# Patient Record
Sex: Female | Born: 1990 | Race: White | Hispanic: No | Marital: Married | Smoking: Former smoker
Health system: Southern US, Community
[De-identification: ages and names within clinical notes are randomized; demographics above are authoritative.]

## PROBLEM LIST (undated history)

## (undated) ENCOUNTER — Inpatient Hospital Stay (HOSPITAL_COMMUNITY): Payer: Self-pay

## (undated) DIAGNOSIS — E559 Vitamin D deficiency, unspecified: Secondary | ICD-10-CM

## (undated) DIAGNOSIS — E039 Hypothyroidism, unspecified: Secondary | ICD-10-CM

## (undated) DIAGNOSIS — R51 Headache: Secondary | ICD-10-CM

## (undated) DIAGNOSIS — R519 Headache, unspecified: Secondary | ICD-10-CM

## (undated) DIAGNOSIS — N943 Premenstrual tension syndrome: Secondary | ICD-10-CM

## (undated) DIAGNOSIS — F32A Depression, unspecified: Secondary | ICD-10-CM

## (undated) DIAGNOSIS — F909 Attention-deficit hyperactivity disorder, unspecified type: Secondary | ICD-10-CM

## (undated) DIAGNOSIS — E7800 Pure hypercholesterolemia, unspecified: Secondary | ICD-10-CM

## (undated) HISTORY — DX: Headache: R51

## (undated) HISTORY — DX: Pure hypercholesterolemia, unspecified: E78.00

## (undated) HISTORY — DX: Attention-deficit hyperactivity disorder, unspecified type: F90.9

## (undated) HISTORY — DX: Headache, unspecified: R51.9

## (undated) HISTORY — PX: WISDOM TOOTH EXTRACTION: SHX21

## (undated) HISTORY — PX: EXTERNAL EAR SURGERY: SHX627

## (undated) HISTORY — PX: TONSILLECTOMY: SUR1361

## (undated) HISTORY — DX: Depression, unspecified: F32.A

---
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
## 2010-04-02 ENCOUNTER — Other Ambulatory Visit: Admission: RE | Admit: 2010-04-02 | Discharge: 2010-04-02 | Payer: Self-pay | Admitting: Obstetrics and Gynecology

## 2010-08-11 ENCOUNTER — Inpatient Hospital Stay (HOSPITAL_COMMUNITY)
Admission: AD | Admit: 2010-08-11 | Discharge: 2010-08-26 | Payer: Self-pay | Source: Home / Self Care | Admitting: Obstetrics and Gynecology

## 2010-08-23 ENCOUNTER — Encounter (INDEPENDENT_AMBULATORY_CARE_PROVIDER_SITE_OTHER): Payer: Self-pay | Admitting: Obstetrics and Gynecology

## 2010-08-23 DIAGNOSIS — O42919 Preterm premature rupture of membranes, unspecified as to length of time between rupture and onset of labor, unspecified trimester: Secondary | ICD-10-CM

## 2010-08-23 DIAGNOSIS — O321XX0 Maternal care for breech presentation, not applicable or unspecified: Secondary | ICD-10-CM

## 2011-01-20 LAB — CBC
HCT: 27.9 % — ABNORMAL LOW (ref 36.0–46.0)
HCT: 31.5 % — ABNORMAL LOW (ref 36.0–46.0)
Hemoglobin: 11 g/dL — ABNORMAL LOW (ref 12.0–15.0)
Hemoglobin: 9.7 g/dL — ABNORMAL LOW (ref 12.0–15.0)
MCH: 31 pg (ref 26.0–34.0)
MCH: 31.3 pg (ref 26.0–34.0)
MCHC: 34.7 g/dL (ref 30.0–36.0)
MCHC: 35 g/dL (ref 30.0–36.0)
MCV: 89.4 fL (ref 78.0–100.0)
MCV: 89.5 fL (ref 78.0–100.0)
Platelets: 199 10*3/uL (ref 150–400)
Platelets: 228 10*3/uL (ref 150–400)
RBC: 3.12 MIL/uL — ABNORMAL LOW (ref 3.87–5.11)
RBC: 3.52 MIL/uL — ABNORMAL LOW (ref 3.87–5.11)
RDW: 14 % (ref 11.5–15.5)
RDW: 14.1 % (ref 11.5–15.5)
WBC: 14.2 10*3/uL — ABNORMAL HIGH (ref 4.0–10.5)
WBC: 14.5 10*3/uL — ABNORMAL HIGH (ref 4.0–10.5)

## 2011-01-20 LAB — TYPE AND SCREEN
ABO/RH(D): O POS
Antibody Screen: NEGATIVE

## 2011-01-21 LAB — CBC
HCT: 26.6 % — ABNORMAL LOW (ref 36.0–46.0)
HCT: 27 % — ABNORMAL LOW (ref 36.0–46.0)
HCT: 31 % — ABNORMAL LOW (ref 36.0–46.0)
Hemoglobin: 10.7 g/dL — ABNORMAL LOW (ref 12.0–15.0)
Hemoglobin: 9.1 g/dL — ABNORMAL LOW (ref 12.0–15.0)
Hemoglobin: 9.6 g/dL — ABNORMAL LOW (ref 12.0–15.0)
MCH: 30.7 pg (ref 26.0–34.0)
MCH: 30.8 pg (ref 26.0–34.0)
MCH: 32.1 pg (ref 26.0–34.0)
MCHC: 34.2 g/dL (ref 30.0–36.0)
MCHC: 34.6 g/dL (ref 30.0–36.0)
MCHC: 35.6 g/dL (ref 30.0–36.0)
MCV: 89.2 fL (ref 78.0–100.0)
MCV: 89.6 fL (ref 78.0–100.0)
MCV: 90.2 fL (ref 78.0–100.0)
Platelets: 165 10*3/uL (ref 150–400)
Platelets: 189 10*3/uL (ref 150–400)
Platelets: 214 10*3/uL (ref 150–400)
RBC: 2.97 MIL/uL — ABNORMAL LOW (ref 3.87–5.11)
RBC: 2.99 MIL/uL — ABNORMAL LOW (ref 3.87–5.11)
RBC: 3.48 MIL/uL — ABNORMAL LOW (ref 3.87–5.11)
RDW: 13.5 % (ref 11.5–15.5)
RDW: 13.7 % (ref 11.5–15.5)
RDW: 14.3 % (ref 11.5–15.5)
WBC: 12.8 10*3/uL — ABNORMAL HIGH (ref 4.0–10.5)
WBC: 13 10*3/uL — ABNORMAL HIGH (ref 4.0–10.5)
WBC: 16 10*3/uL — ABNORMAL HIGH (ref 4.0–10.5)

## 2011-01-21 LAB — GC/CHLAMYDIA PROBE AMP, URINE
Chlamydia, Swab/Urine, PCR: NEGATIVE
GC Probe Amp, Urine: NEGATIVE

## 2011-01-21 LAB — GLUCOSE, CAPILLARY: Glucose-Capillary: 144 mg/dL — ABNORMAL HIGH (ref 70–99)

## 2011-01-21 LAB — URINE MICROSCOPIC-ADD ON

## 2011-01-21 LAB — DIFFERENTIAL
Basophils Absolute: 0 10*3/uL (ref 0.0–0.1)
Basophils Absolute: 0 10*3/uL (ref 0.0–0.1)
Basophils Relative: 0 % (ref 0–1)
Basophils Relative: 0 % (ref 0–1)
Eosinophils Absolute: 0 10*3/uL (ref 0.0–0.7)
Eosinophils Absolute: 0 10*3/uL (ref 0.0–0.7)
Eosinophils Relative: 0 % (ref 0–5)
Eosinophils Relative: 0 % (ref 0–5)
Lymphocytes Relative: 10 % — ABNORMAL LOW (ref 12–46)
Lymphocytes Relative: 16 % (ref 12–46)
Lymphs Abs: 1.6 10*3/uL (ref 0.7–4.0)
Lymphs Abs: 2.1 10*3/uL (ref 0.7–4.0)
Monocytes Absolute: 0.7 10*3/uL (ref 0.1–1.0)
Monocytes Absolute: 0.7 10*3/uL (ref 0.1–1.0)
Monocytes Relative: 5 % (ref 3–12)
Monocytes Relative: 6 % (ref 3–12)
Neutro Abs: 10.1 10*3/uL — ABNORMAL HIGH (ref 1.7–7.7)
Neutro Abs: 13.7 10*3/uL — ABNORMAL HIGH (ref 1.7–7.7)
Neutrophils Relative %: 78 % — ABNORMAL HIGH (ref 43–77)
Neutrophils Relative %: 85 % — ABNORMAL HIGH (ref 43–77)

## 2011-01-21 LAB — URINALYSIS, ROUTINE W REFLEX MICROSCOPIC
Bilirubin Urine: NEGATIVE
Glucose, UA: NEGATIVE mg/dL
Hgb urine dipstick: NEGATIVE
Ketones, ur: NEGATIVE mg/dL
Nitrite: NEGATIVE
Protein, ur: NEGATIVE mg/dL
Specific Gravity, Urine: 1.01 (ref 1.005–1.030)
Urobilinogen, UA: 0.2 mg/dL (ref 0.0–1.0)
pH: 7 (ref 5.0–8.0)

## 2011-01-21 LAB — STREP B DNA PROBE: Strep Group B Ag: NEGATIVE

## 2011-01-21 LAB — WET PREP, GENITAL
Clue Cells Wet Prep HPF POC: NONE SEEN
Trich, Wet Prep: NONE SEEN
Yeast Wet Prep HPF POC: NONE SEEN

## 2011-01-21 LAB — RPR: RPR Ser Ql: NONREACTIVE

## 2011-01-21 LAB — MAGNESIUM: Magnesium: 4.9 mg/dL — ABNORMAL HIGH (ref 1.5–2.5)

## 2011-09-28 ENCOUNTER — Other Ambulatory Visit: Payer: Self-pay | Admitting: Otolaryngology

## 2011-09-28 DIAGNOSIS — H902 Conductive hearing loss, unspecified: Secondary | ICD-10-CM

## 2011-09-28 DIAGNOSIS — H9210 Otorrhea, unspecified ear: Secondary | ICD-10-CM

## 2011-09-28 DIAGNOSIS — H6980 Other specified disorders of Eustachian tube, unspecified ear: Secondary | ICD-10-CM

## 2011-09-28 DIAGNOSIS — H7190 Unspecified cholesteatoma, unspecified ear: Secondary | ICD-10-CM

## 2011-09-29 ENCOUNTER — Ambulatory Visit
Admission: RE | Admit: 2011-09-29 | Discharge: 2011-09-29 | Disposition: A | Discharge status: Home / Self Care | Payer: BC Managed Care – PPO | Source: Ambulatory Visit | Attending: Otolaryngology | Admitting: Otolaryngology

## 2011-09-29 DIAGNOSIS — H6980 Other specified disorders of Eustachian tube, unspecified ear: Secondary | ICD-10-CM

## 2011-09-29 DIAGNOSIS — H9210 Otorrhea, unspecified ear: Secondary | ICD-10-CM

## 2011-09-29 DIAGNOSIS — H902 Conductive hearing loss, unspecified: Secondary | ICD-10-CM

## 2011-09-29 DIAGNOSIS — H7190 Unspecified cholesteatoma, unspecified ear: Secondary | ICD-10-CM

## 2011-10-06 ENCOUNTER — Other Ambulatory Visit: Payer: Self-pay | Admitting: Otolaryngology

## 2011-10-07 IMAGING — US US OB COMP +14 WK
1 series · 14 of 28 positions shown · non-contrast
Comparison: none

OBSTETRICAL ULTRASOUND:
 This ultrasound exam was performed in the [HOSPITAL] Ultrasound Department.  The OB US report was generated in the AS system, and faxed to the ordering physician.  This report is also available in [HOSPITAL]?s [employer] and in [REDACTED] PACS.

[Series 1: us ob comp +14 wk · 0.27mm/px · 41 acquisitions, 14 frames shown]
[im 2/41]
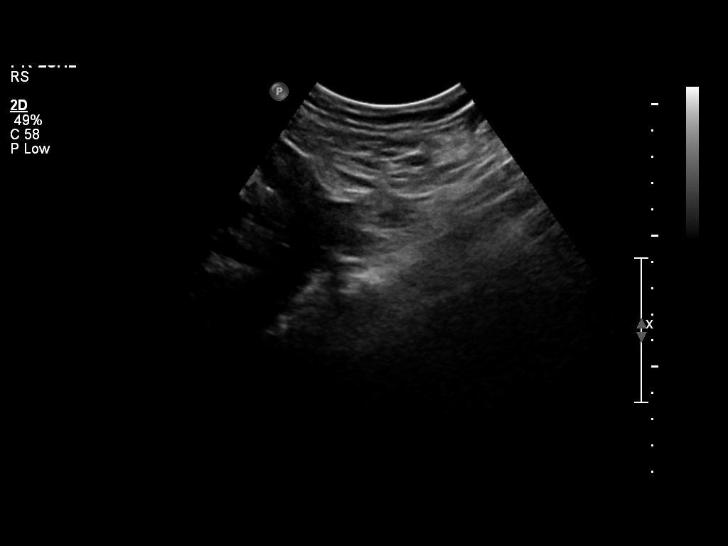
[im 5/41]
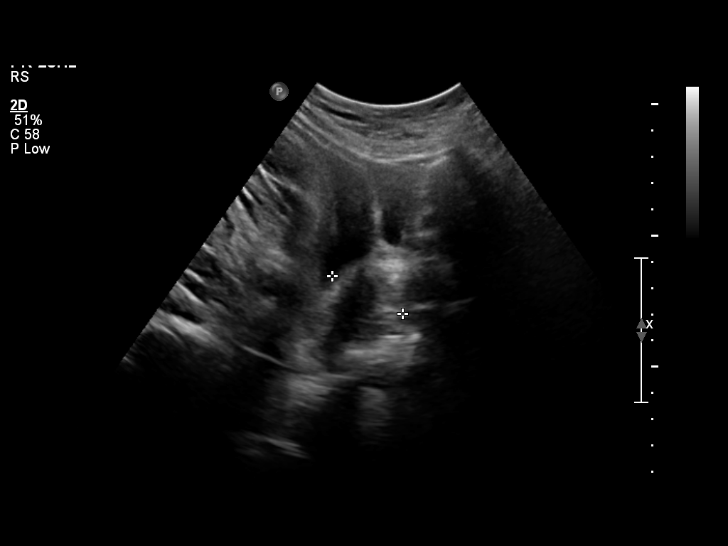
[im 8/41]
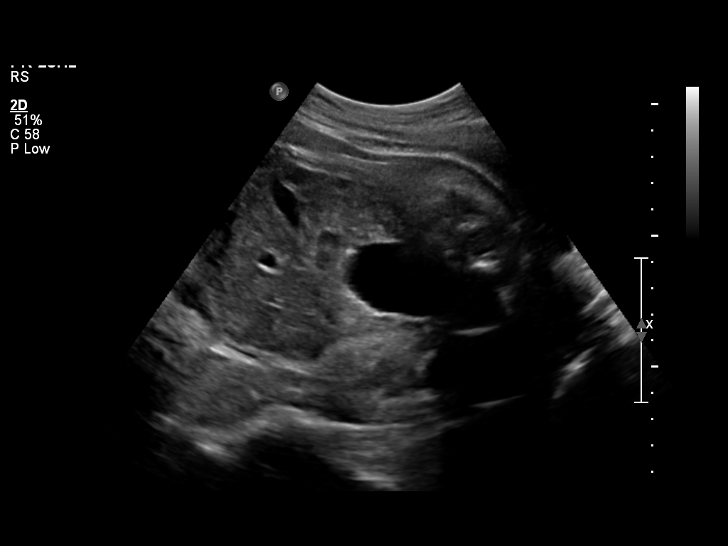
[im 11/41]
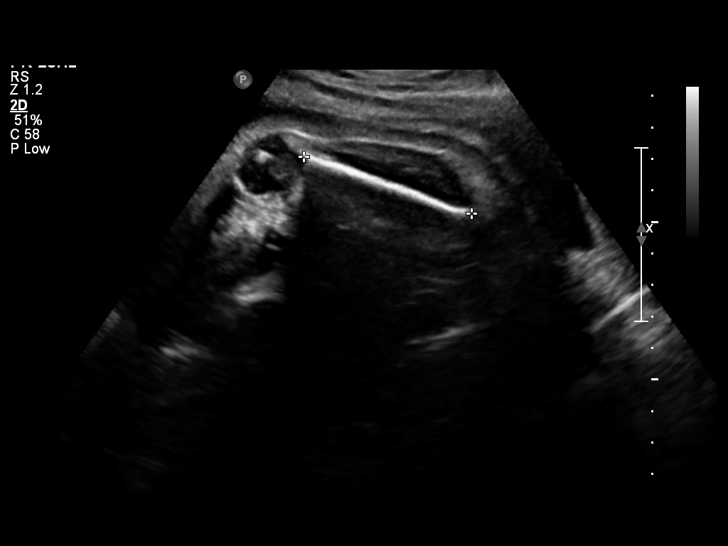
[im 14/41]
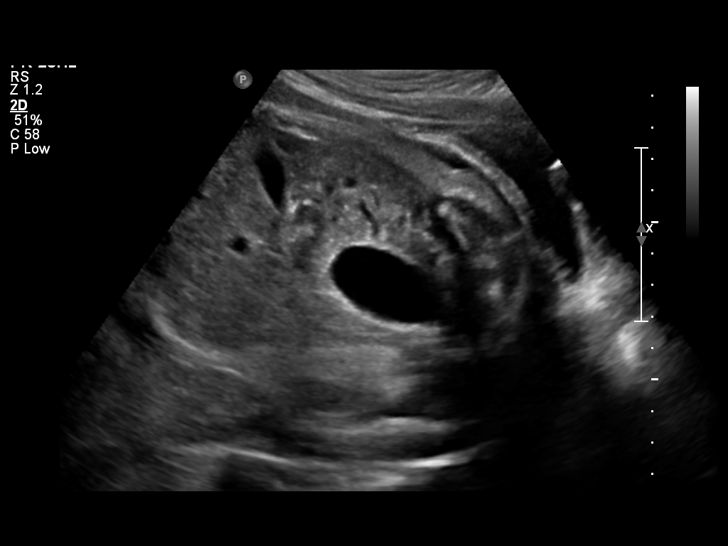
[im 17/41]
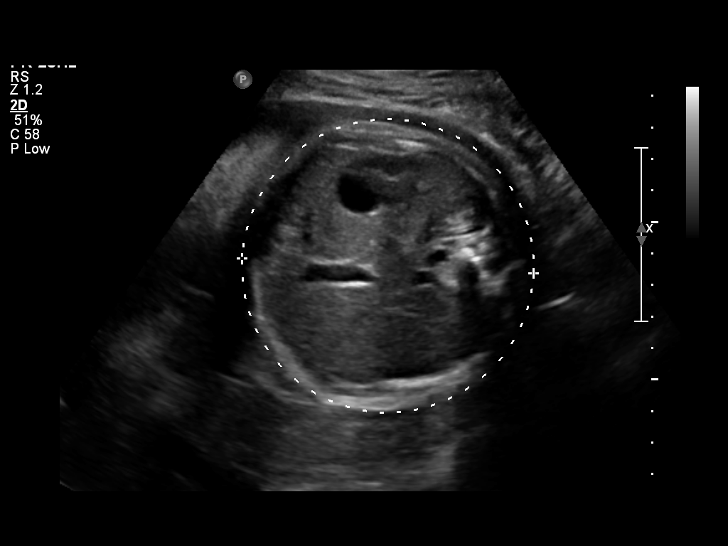
[im 20/41]
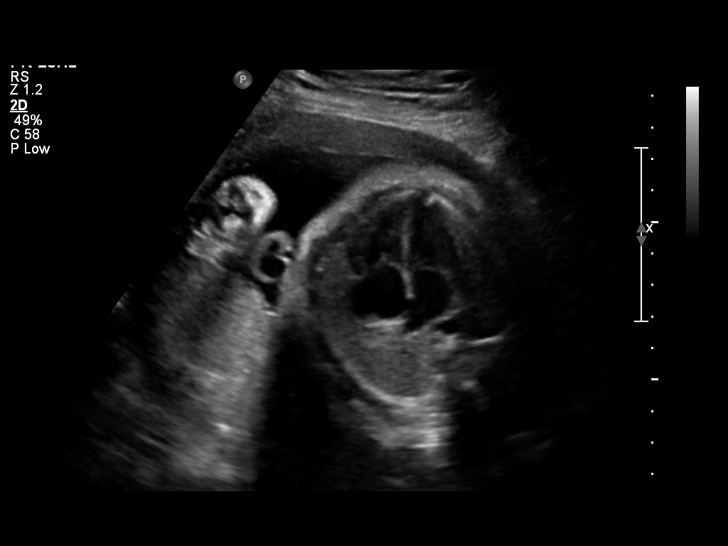
[im 23/41]
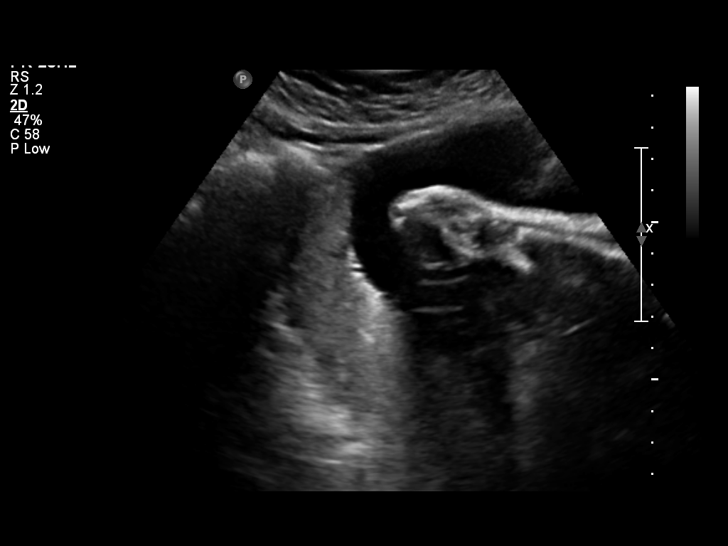
[im 26/41]
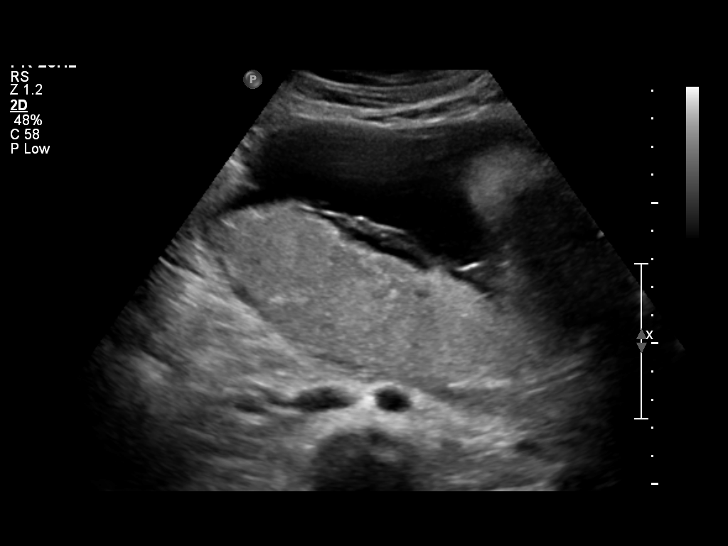
[im 29/41]
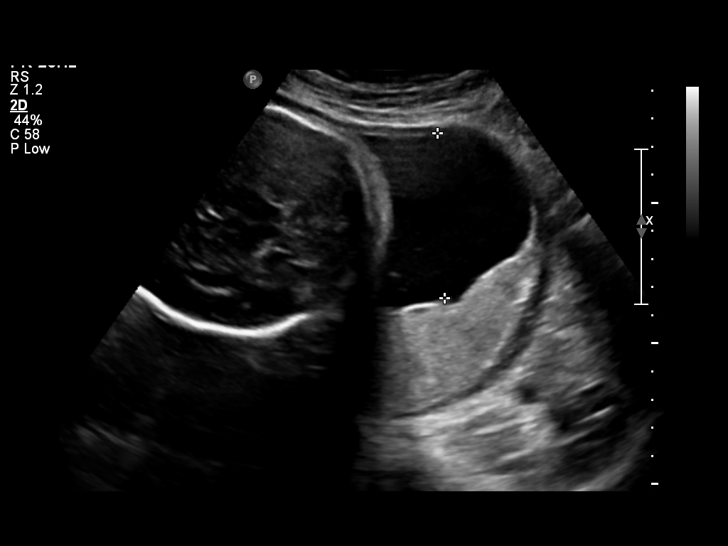
[im 32/41]
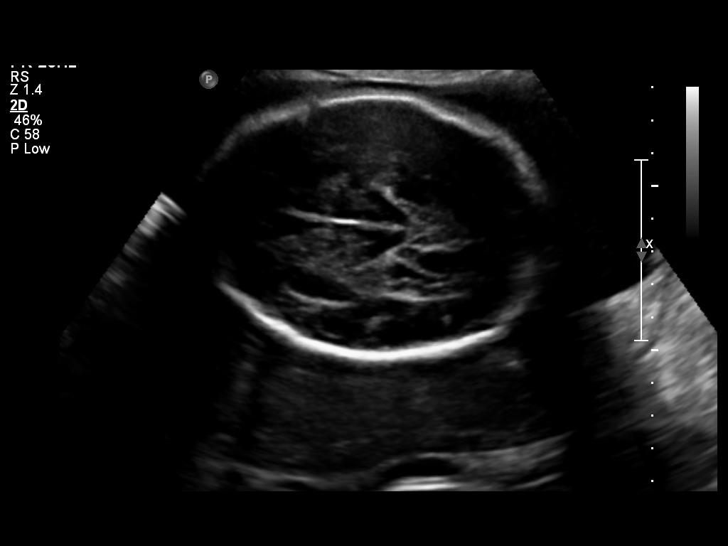
[im 35/41]
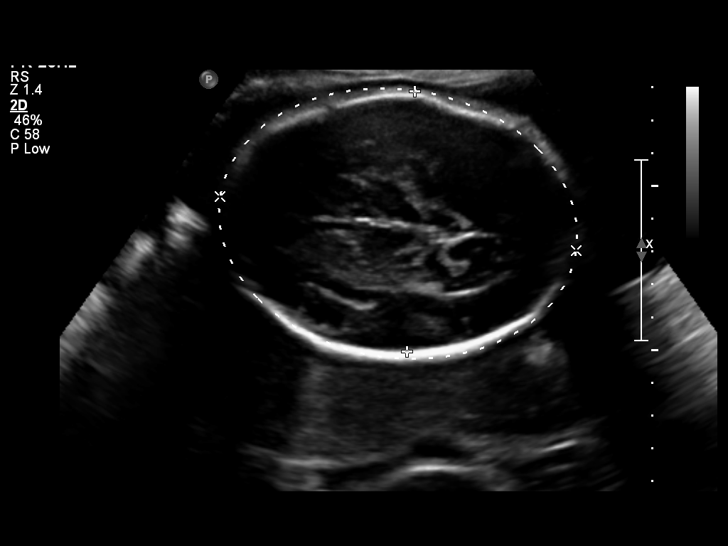
[im 38/41]
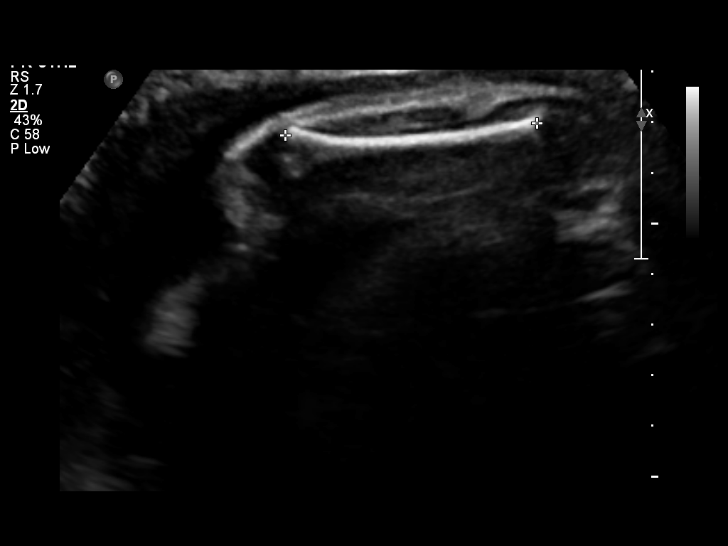
[im 41/41]
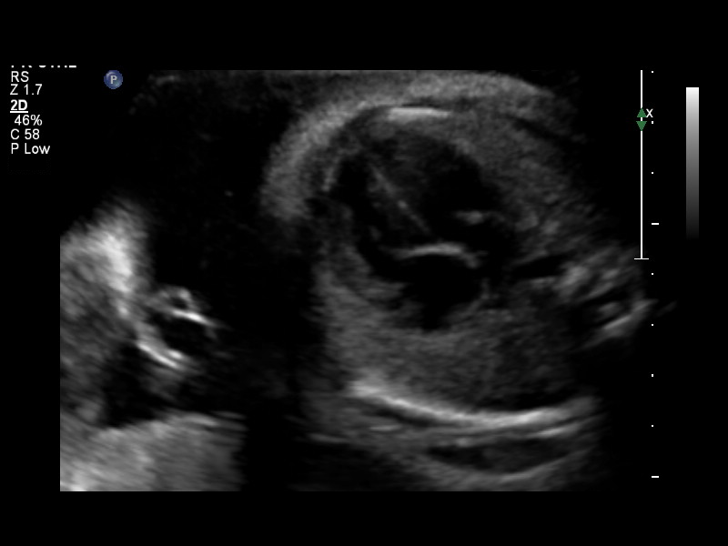

[14 of 28 positions shown; findings below may reference images not displayed]

IMPRESSION: See AS Obstetric US report.

## 2011-10-10 IMAGING — CR DG CHEST 1V PORT
1 series · 1 of 1 positions shown · non-contrast
Comparison: None.

CLINICAL DATA: 19 year old with shortness of breath and 32 weeks
pregnant.

PORTABLE CHEST - 1 VIEW

[view not recorded]
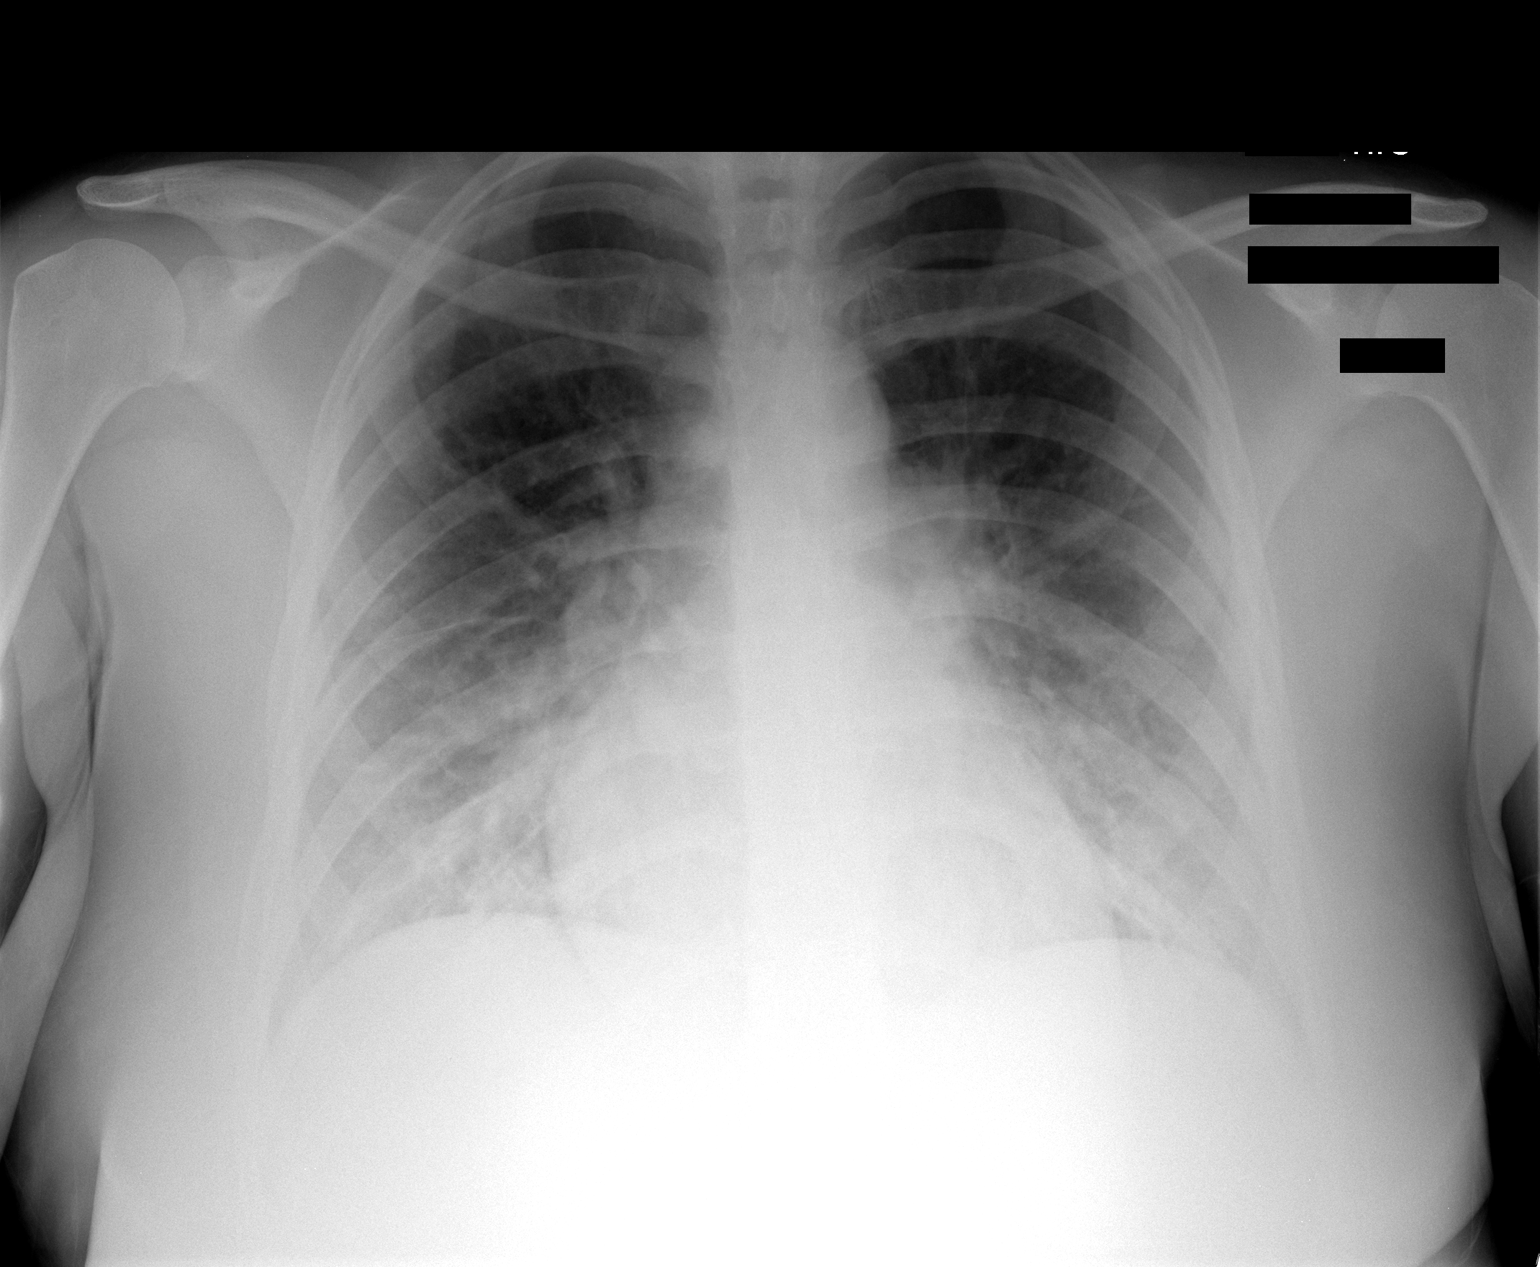

[1 of 1 positions shown; findings below may reference images not displayed]

FINDINGS: Single view of the chest demonstrates bilateral airspace
densities in the lower lungs.  Heart size is within normal limits.
Trachea is midline.  Central peribronchial thickening  raises
concern for edema.  Osseous structures appear intact.
IMPRESSION: Bilateral airspace and interstitial densities suggestive for mild
pulmonary edema.

## 2011-11-08 ENCOUNTER — Emergency Department (HOSPITAL_COMMUNITY)
Admission: EM | Admit: 2011-11-08 | Discharge: 2011-11-08 | Disposition: A | Discharge status: Home / Self Care | Payer: BC Managed Care – PPO | Attending: Emergency Medicine | Admitting: Emergency Medicine

## 2011-11-08 ENCOUNTER — Encounter: Payer: Self-pay | Admitting: Emergency Medicine

## 2011-11-08 DIAGNOSIS — R112 Nausea with vomiting, unspecified: Secondary | ICD-10-CM | POA: Insufficient documentation

## 2011-11-08 DIAGNOSIS — Z79899 Other long term (current) drug therapy: Secondary | ICD-10-CM | POA: Insufficient documentation

## 2011-11-08 DIAGNOSIS — I10 Essential (primary) hypertension: Secondary | ICD-10-CM | POA: Insufficient documentation

## 2011-11-08 DIAGNOSIS — R197 Diarrhea, unspecified: Secondary | ICD-10-CM | POA: Insufficient documentation

## 2011-11-08 DIAGNOSIS — IMO0001 Reserved for inherently not codable concepts without codable children: Secondary | ICD-10-CM | POA: Insufficient documentation

## 2011-11-08 DIAGNOSIS — R109 Unspecified abdominal pain: Secondary | ICD-10-CM | POA: Insufficient documentation

## 2011-11-08 DIAGNOSIS — F17200 Nicotine dependence, unspecified, uncomplicated: Secondary | ICD-10-CM | POA: Insufficient documentation

## 2011-11-08 DIAGNOSIS — R6883 Chills (without fever): Secondary | ICD-10-CM | POA: Insufficient documentation

## 2011-11-08 MED ORDER — ONDANSETRON 4 MG PO TBDP
8.0000 mg | ORAL_TABLET | Freq: Once | ORAL | Status: AC
  Administered 2011-11-08: 8 mg via ORAL
  Filled 2011-11-08: qty 2

## 2011-11-08 MED ORDER — ONDANSETRON 8 MG PO TBDP: 8.0000 mg | ORAL_TABLET | Freq: Three times a day (TID) | ORAL | Status: AC | PRN

## 2011-11-08 NOTE — ED Notes (Signed)
Pt continues  sipping water. No nausea or vomiting at this time. Will continue to monitor.

## 2011-11-08 NOTE — ED Provider Notes (Signed)
History     CSN: [id number]  Arrival date & time 11/08/11  0502   First MD Initiated Contact with Patient 11/08/11 0522      Chief Complaint  Patient presents with  . Emesis  . Diarrhea     Patient is a 20 y.o. female presenting with vomiting and diarrhea. The history is provided by the patient.  Emesis  This is a new problem. The current episode started 1 to 2 hours ago. The problem occurs 2 to 4 times per day. The problem has been gradually worsening. The emesis has an appearance of stomach contents. There has been no fever. Associated symptoms include abdominal pain, chills, diarrhea and myalgias. Pertinent negatives include no cough.  Diarrhea The primary symptoms include abdominal pain, vomiting, diarrhea and myalgias.  The illness is also significant for chills.  no recent travel No recent antibiotics Reports abd cramping She has had at least two episodes of diarrhea and several episodes of vomiting  Past Medical History  Diagnosis Date  . Hypertension       History reviewed. No pertinent family history.  History  Substance Use Topics  . Smoking status: Current Everyday Smoker -- 0.5 packs/day  . Smokeless tobacco: Not on file  . Alcohol Use: No    OB History    Grav Para Term Preterm Abortions TAB SAB Ect Mult Living                  Review of Systems  Constitutional: Positive for chills.  Respiratory: Negative for cough.   Gastrointestinal: Positive for vomiting, abdominal pain and diarrhea.  Musculoskeletal: Positive for myalgias.  All other systems reviewed and are negative.    Allergies  Amoxicillin  Home Medications   Current Outpatient Rx  Name Route Sig Dispense Refill  . WELLBUTRIN PO Oral Take 1 tablet by mouth daily.        BP 113/79  Pulse 100  Temp(Src) 97.8 F (36.6 C) (Oral)  Resp 20  SpO2 95%  LMP 01/05/2010  Physical Exam  CONSTITUTIONAL: Well developed/well nourished HEAD AND FACE: Normocephalic/atraumatic EYES:  EOMI/PERRL, no scleral icterus ENMT: Mucous membranes dry NECK: supple no meningeal signs SPINE:entire spine nontender CV: S1/S2 noted, no murmurs/rubs/gallops noted LUNGS: Lungs are clear to auscultation bilaterally, no apparent distress ABDOMEN: soft, nontender, no rebound or guarding GU:no cva tenderness NEURO: Pt is awake/alert, moves all extremitiesx4 EXTREMITIES: pulses normal, full ROM SKIN: warm, color normal PSYCH: no abnormalities of mood noted    ED Course  Procedures   7:29 AM Pt improved, taking PO, no distress Stable for d/c  MDM  Nursing notes reviewed and considered in documentation        Joya Gaskins, MD 11/08/11 [phone]

## 2011-11-08 NOTE — ED Notes (Signed)
Pt resting in bed. Pt finished first cup of water without any nausea or vomiting. EDP made aware. Gave pt second cup of water. Pt told to let RN know if she N/V begins.

## 2011-11-08 NOTE — ED Notes (Signed)
Pt vomited right after taking Zofran. Pt extremely concerned that she did not take full dose. Informed EDP.  No new Zofran orders at this time. Gave pt a cup of water for fluid challenge. Will continue to monitor.

## 2011-11-08 NOTE — ED Notes (Signed)
Pt stated that she woke up and was not feeling well. She became nauseated and started vomiting around 3AM. She also state that she has had diarrhea, as well. Pt states that she started to have chills and body aches. Pt stated that she has been SOB, since 3 AM. Pt currently not in ay respiratory distress. Pt stated that she has been having chest tightness.

## 2011-11-08 NOTE — ED Notes (Signed)
Report given to [facility]. No questions or concerns.

## 2012-02-24 ENCOUNTER — Other Ambulatory Visit: Payer: Self-pay | Admitting: Obstetrics and Gynecology

## 2012-02-24 ENCOUNTER — Other Ambulatory Visit (HOSPITAL_COMMUNITY)
Admission: RE | Admit: 2012-02-24 | Discharge: 2012-02-24 | Disposition: A | Discharge status: Home / Self Care | Payer: BC Managed Care – PPO | Source: Ambulatory Visit | Attending: Obstetrics and Gynecology | Admitting: Obstetrics and Gynecology

## 2012-02-24 DIAGNOSIS — Z01419 Encounter for gynecological examination (general) (routine) without abnormal findings: Secondary | ICD-10-CM | POA: Insufficient documentation

## 2012-02-24 DIAGNOSIS — Z113 Encounter for screening for infections with a predominantly sexual mode of transmission: Secondary | ICD-10-CM | POA: Insufficient documentation

## 2012-02-24 DIAGNOSIS — N760 Acute vaginitis: Secondary | ICD-10-CM | POA: Insufficient documentation

## 2013-03-20 ENCOUNTER — Other Ambulatory Visit: Payer: Self-pay | Admitting: Obstetrics and Gynecology

## 2013-03-20 ENCOUNTER — Other Ambulatory Visit (HOSPITAL_COMMUNITY)
Admission: RE | Admit: 2013-03-20 | Discharge: 2013-03-20 | Disposition: A | Discharge status: Home / Self Care | Payer: BC Managed Care – PPO | Source: Ambulatory Visit | Attending: Obstetrics and Gynecology | Admitting: Obstetrics and Gynecology

## 2013-03-20 DIAGNOSIS — Z01419 Encounter for gynecological examination (general) (routine) without abnormal findings: Secondary | ICD-10-CM | POA: Insufficient documentation

## 2013-03-20 DIAGNOSIS — Z113 Encounter for screening for infections with a predominantly sexual mode of transmission: Secondary | ICD-10-CM | POA: Insufficient documentation

## 2013-09-27 ENCOUNTER — Other Ambulatory Visit (HOSPITAL_COMMUNITY): Payer: Self-pay | Admitting: Obstetrics & Gynecology

## 2013-09-27 DIAGNOSIS — IMO0002 Reserved for concepts with insufficient information to code with codable children: Secondary | ICD-10-CM

## 2013-10-03 ENCOUNTER — Ambulatory Visit (HOSPITAL_COMMUNITY): Payer: BC Managed Care – PPO

## 2013-11-13 ENCOUNTER — Encounter (HOSPITAL_COMMUNITY): Payer: Self-pay | Admitting: Emergency Medicine

## 2013-11-13 ENCOUNTER — Emergency Department (HOSPITAL_COMMUNITY)
Admission: EM | Admit: 2013-11-13 | Discharge: 2013-11-13 | Disposition: A | Discharge status: Home / Self Care | Payer: BC Managed Care – PPO | Attending: Emergency Medicine | Admitting: Emergency Medicine

## 2013-11-13 DIAGNOSIS — R6889 Other general symptoms and signs: Secondary | ICD-10-CM

## 2013-11-13 DIAGNOSIS — H6691 Otitis media, unspecified, right ear: Secondary | ICD-10-CM

## 2013-11-13 DIAGNOSIS — F17200 Nicotine dependence, unspecified, uncomplicated: Secondary | ICD-10-CM | POA: Insufficient documentation

## 2013-11-13 DIAGNOSIS — R110 Nausea: Secondary | ICD-10-CM | POA: Insufficient documentation

## 2013-11-13 DIAGNOSIS — H5710 Ocular pain, unspecified eye: Secondary | ICD-10-CM | POA: Insufficient documentation

## 2013-11-13 DIAGNOSIS — J111 Influenza due to unidentified influenza virus with other respiratory manifestations: Secondary | ICD-10-CM | POA: Insufficient documentation

## 2013-11-13 DIAGNOSIS — IMO0001 Reserved for inherently not codable concepts without codable children: Secondary | ICD-10-CM | POA: Insufficient documentation

## 2013-11-13 DIAGNOSIS — H6690 Otitis media, unspecified, unspecified ear: Secondary | ICD-10-CM | POA: Insufficient documentation

## 2013-11-13 DIAGNOSIS — I10 Essential (primary) hypertension: Secondary | ICD-10-CM | POA: Insufficient documentation

## 2013-11-13 MED ORDER — IBUPROFEN 800 MG PO TABS
800.0000 mg | ORAL_TABLET | Freq: Once | ORAL | Status: AC
  Administered 2013-11-13: 800 mg via ORAL
  Filled 2013-11-13: qty 1

## 2013-11-13 MED ORDER — AMOXICILLIN-POT CLAVULANATE 875-125 MG PO TABS: 1.0000 | ORAL_TABLET | Freq: Two times a day (BID) | ORAL | Status: DC

## 2013-11-13 MED ORDER — GUAIFENESIN-CODEINE 100-10 MG/5ML PO SYRP: 5.0000 mL | ORAL_SOLUTION | Freq: Three times a day (TID) | ORAL | Status: DC | PRN

## 2013-11-13 NOTE — ED Notes (Signed)
Pt c/o cough, fever, r eye pain and swelling, and generalized body aches since yesterday.  Temp was 102.6 last night.  Pt took tylenol last night, none today.

## 2013-11-13 NOTE — ED Provider Notes (Signed)
CSN: [id number]     Arrival date & time 11/13/13  1032 History   First MD Initiated Contact with Patient 11/13/13 1145     Chief Complaint  Patient presents with  . Generalized Body Aches  . Eye Pain  . Cough  . Fever   (Consider location/radiation/quality/duration/timing/severity/associated sxs/prior Treatment) Patient is a 23 y.o. female presenting with eye pain, cough, and fever. The history is provided by the patient.  Eye Pain This is a new problem. The current episode started yesterday. Associated symptoms include chills, congestion, coughing, a fever, headaches, myalgias, nausea, a sore throat and swollen glands. Pertinent negatives include no anorexia, change in bowel habit, rash, urinary symptoms or vomiting. Exacerbated by: light. She has tried acetaminophen for the symptoms. The treatment provided mild relief.  Cough Associated symptoms: chills, ear pain, fever, headaches, myalgias and sore throat   Associated symptoms: no rash   Fever Associated symptoms: chills, congestion, cough, ear pain, headaches, myalgias, nausea and sore throat   Associated symptoms: no confusion, no dysuria, no rash and no vomiting   Jearldean Gutt is a 23 y.o. female who presents to the ED with cough, congestion, chills and fever that started 2 days ago. The nurse took her temperature last night at work and it was 102.6. She took tylenol and it went down. Today feels achy all over. She did not take her temperature.   Past Medical History  Diagnosis Date  . Hypertension    Past Surgical History  Procedure Laterality Date  . Cesarean section    . External ear surgery    . Tonsillectomy     No family history on file. History  Substance Use Topics  . Smoking status: Current Every Day Smoker -- 0.50 packs/day  . Smokeless tobacco: Not on file  . Alcohol Use: No   OB History   Grav Para Term Preterm Abortions TAB SAB Ect Mult Living                 Review of Systems  Constitutional: Positive  for fever and chills.  HENT: Positive for congestion, ear pain, sinus pressure and sore throat. Negative for trouble swallowing.   Eyes: Positive for pain. Negative for itching.  Respiratory: Positive for cough.   Gastrointestinal: Positive for nausea. Negative for vomiting, anorexia and change in bowel habit.  Genitourinary: Negative for dysuria, urgency and frequency.  Musculoskeletal: Positive for myalgias. Negative for neck stiffness.  Skin: Negative for rash.  Allergic/Immunologic: Negative for immunocompromised state.  Neurological: Positive for headaches. Negative for dizziness and light-headedness.  Psychiatric/Behavioral: Negative for confusion. The patient is not nervous/anxious.     Allergies  Amoxicillin  Home Medications   Current Outpatient Rx  Name  Route  Sig  Dispense  Refill  . acetaminophen (TYLENOL) 500 MG tablet   Oral   Take 1,000 mg by mouth every 6 (six) hours as needed. pain          BP 116/80  Pulse 93  Temp(Src) 98.3 F (36.8 C) (Oral)  Resp 18  Ht 5\' 2"  (1.575 m)  Wt 165 lb (74.844 kg)  BMI 30.17 kg/m2  SpO2 98%  LMP 11/13/2013 Physical Exam  Nursing note and vitals reviewed. Constitutional: She is oriented to person, place, and time. She appears well-developed and well-nourished. No distress.  HENT:  Head: Normocephalic and atraumatic.  Right Ear: There is drainage and tenderness.  Nose: Mucosal edema and rhinorrhea present. Right sinus exhibits maxillary sinus tenderness and frontal sinus tenderness.  Mouth/Throat: Uvula is midline and mucous membranes are normal. Posterior oropharyngeal erythema present.  PE tubes noted bilateral TM's. There is drainage noted from the right with erythema surrounding the tube.   Eyes: EOM are normal. Pupils are equal, round, and reactive to light. Right conjunctiva is injected.  Minimal swelling around right eye. Watering of right eye.   Neck: Normal range of motion. Neck supple.  Cardiovascular: Normal  rate and regular rhythm.   Pulmonary/Chest: Effort normal. She has no wheezes. She has no rales.  Abdominal: Soft. Bowel sounds are normal. There is no tenderness.  Musculoskeletal: Normal range of motion.  Lymphadenopathy:    She has no cervical adenopathy.  Neurological: She is alert and oriented to person, place, and time. No cranial nerve deficit.  Skin: Skin is warm and dry.  Psychiatric: She has a normal mood and affect. Her behavior is normal.    ED Course  Procedures   MDM  23 y.o. female with cough, congestion, fever and right ear pain, right side facial pain x 2 days. Will treat for flu like symptoms and treat ear infection right. Stable for discharge without signs of sepsis. She is afebrile at this time and 02 SAT is 98% on R/A.  Discussed with the patient and all questioned fully answered. She will return if any problems arise.     Janne NapoleonHope M Swanson Farnell, NP 11/13/13 1226

## 2013-11-13 NOTE — ED Notes (Signed)
H. Neese, NP at bedside 

## 2013-11-13 NOTE — ED Provider Notes (Signed)
Medical screening examination/treatment/procedure(s) were performed by non-physician practitioner and as supervising physician I was immediately available for consultation/collaboration.  EKG Interpretation   None         Adalei Novell L Shatonya Passon, MD 11/13/13 1616 

## 2013-11-13 NOTE — ED Notes (Signed)
Pt with c/o cough since 11/08/13, since last night c/o fever, body aches and right eye and facial pain, no fever noted in triage

## 2014-10-24 ENCOUNTER — Encounter (HOSPITAL_COMMUNITY): Payer: Self-pay | Admitting: Emergency Medicine

## 2014-10-24 ENCOUNTER — Emergency Department (HOSPITAL_COMMUNITY)
Admission: EM | Admit: 2014-10-24 | Discharge: 2014-10-25 | Disposition: A | Discharge status: Home / Self Care | Payer: BLUE CROSS/BLUE SHIELD | Attending: Emergency Medicine | Admitting: Emergency Medicine

## 2014-10-24 DIAGNOSIS — Z3202 Encounter for pregnancy test, result negative: Secondary | ICD-10-CM | POA: Insufficient documentation

## 2014-10-24 DIAGNOSIS — R109 Unspecified abdominal pain: Secondary | ICD-10-CM | POA: Diagnosis present

## 2014-10-24 DIAGNOSIS — Z880 Allergy status to penicillin: Secondary | ICD-10-CM | POA: Insufficient documentation

## 2014-10-24 DIAGNOSIS — Z91040 Latex allergy status: Secondary | ICD-10-CM | POA: Diagnosis not present

## 2014-10-24 DIAGNOSIS — Z79899 Other long term (current) drug therapy: Secondary | ICD-10-CM | POA: Diagnosis not present

## 2014-10-24 DIAGNOSIS — N938 Other specified abnormal uterine and vaginal bleeding: Secondary | ICD-10-CM | POA: Insufficient documentation

## 2014-10-24 DIAGNOSIS — Z720 Tobacco use: Secondary | ICD-10-CM | POA: Insufficient documentation

## 2014-10-24 DIAGNOSIS — R112 Nausea with vomiting, unspecified: Secondary | ICD-10-CM | POA: Insufficient documentation

## 2014-10-24 DIAGNOSIS — R1031 Right lower quadrant pain: Secondary | ICD-10-CM | POA: Insufficient documentation

## 2014-10-24 LAB — URINALYSIS, ROUTINE W REFLEX MICROSCOPIC
Bilirubin Urine: NEGATIVE
Glucose, UA: NEGATIVE mg/dL
Ketones, ur: NEGATIVE mg/dL
Leukocytes, UA: NEGATIVE
Nitrite: NEGATIVE
Protein, ur: NEGATIVE mg/dL
Specific Gravity, Urine: >1.03 — ABNORMAL HIGH (ref 1.005–1.030)
Urobilinogen, UA: 0.2 mg/dL (ref 0.0–1.0)
pH: 5.5 (ref 5.0–8.0)

## 2014-10-24 LAB — PREGNANCY, URINE: Preg Test, Ur: NEGATIVE

## 2014-10-24 LAB — URINE MICROSCOPIC-ADD ON

## 2014-10-24 MED ORDER — ONDANSETRON HCL 4 MG/2ML IJ SOLN: 4.0000 mg | Freq: Once | INTRAMUSCULAR | Status: DC

## 2014-10-24 MED ORDER — SODIUM CHLORIDE 0.9 % IV BOLUS (SEPSIS)
1000.0000 mL | Freq: Once | INTRAVENOUS | Status: AC
  Administered 2014-10-24: 1000 mL via INTRAVENOUS

## 2014-10-24 MED ORDER — KETOROLAC TROMETHAMINE 30 MG/ML IJ SOLN
15.0000 mg | Freq: Once | INTRAMUSCULAR | Status: AC
  Administered 2014-10-24: 15 mg via INTRAVENOUS
  Filled 2014-10-24: qty 1

## 2014-10-24 MED ORDER — ONDANSETRON HCL 4 MG/2ML IJ SOLN
4.0000 mg | Freq: Once | INTRAMUSCULAR | Status: AC
  Administered 2014-10-24: 4 mg via INTRAVENOUS
  Filled 2014-10-24: qty 2

## 2014-10-24 MED ORDER — MORPHINE SULFATE 4 MG/ML IJ SOLN
6.0000 mg | Freq: Once | INTRAMUSCULAR | Status: AC
  Administered 2014-10-24: 6 mg via INTRAVENOUS
  Filled 2014-10-24: qty 2

## 2014-10-24 NOTE — ED Notes (Signed)
Pt c/o lower abd cramping with nausea. Pt had one time of spotting of blood Sunday.

## 2014-10-24 NOTE — ED Provider Notes (Signed)
CSN: [id number]     Arrival date & time 10/24/14  2129 History  This chart was scribed for Raeford RazorStephen Kentley Blyden, MD by Bronson CurbJacqueline Melvin, ED Scribe. This patient was seen in room [facility] and the patient's care was started at 11:09 PM.   Chief Complaint  Patient presents with  . Abdominal Pain    The history is provided by the patient. No language interpreter was used.     HPI Comments: Katherine Villegas is a 23 y.o. female who presents to the Emergency Department complaining of constant, lower abdominal pain onset 5 days ago. Patient describes the pain as cramping and reports it is worse on the right side. She noted associated spotting during initial onset, however this has since resolved. In addition, there is associated nausea, vomiting, and diarrhea that began 2 days after initial onset. She has taken Tylenol and Pepto Bismol without sginificant improvement. Patient denies history of prior episodes and reports she has regular menstrual periods.   History reviewed. No pertinent past medical history. Past Surgical History  Procedure Laterality Date  . Cesarean section    . External ear surgery    . Tonsillectomy     History reviewed. No pertinent family history. History  Substance Use Topics  . Smoking status: Current Every Day Smoker -- 0.50 packs/day  . Smokeless tobacco: Not on file  . Alcohol Use: No   OB History    No data available     Review of Systems  Constitutional: Negative for fever.  Gastrointestinal: Positive for nausea, vomiting, abdominal pain and diarrhea.  Genitourinary: Positive for vaginal bleeding.  All other systems reviewed and are negative.     Allergies  Amoxicillin and Latex  Home Medications   Prior to Admission medications   Medication Sig Start Date End Date Taking? Authorizing Provider  acetaminophen (TYLENOL) 160 MG/5ML suspension Take 160 mg by mouth once as needed for mild pain or moderate pain.   Yes Historical Provider, MD  acetaminophen  (TYLENOL) 500 MG tablet Take 1,000 mg by mouth every 6 (six) hours as needed. pain   Yes Historical Provider, MD  ibuprofen (ADVIL,MOTRIN) 200 MG tablet Take 200 mg by mouth every 6 (six) hours as needed for moderate pain.   Yes Historical Provider, MD  Prenatal Vit-Fe Fumarate-FA (PRENATAL MULTIVITAMIN) TABS tablet Take 1 tablet by mouth daily at 12 noon.   Yes Historical Provider, MD  amoxicillin-clavulanate (AUGMENTIN) 875-125 MG per tablet Take 1 tablet by mouth 2 (two) times daily. Patient not taking: Reported on 10/24/2014 11/13/13   Janne NapoleonHope M Neese, NP  guaiFENesin-codeine [facility](ROBITUSSIN AC) 100-10 MG/5ML syrup Take 5 mLs by mouth 3 (three) times daily as needed for cough. Patient not taking: Reported on 10/24/2014 11/13/13   Janne NapoleonHope M Neese, NP   Triage Vitals: BP 124/77 mmHg  Pulse 97  Temp(Src) 97.9 F (36.6 C) (Oral)  Resp 16  Ht 5\' 2"  (1.575 m)  Wt 200 lb 6.4 oz (90.901 kg)  BMI 36.64 kg/m2  SpO2 100%  LMP 10/06/2014  Physical Exam  Constitutional: She is oriented to person, place, and time. She appears well-developed and well-nourished. No distress.  HENT:  Head: Normocephalic and atraumatic.  Eyes: Conjunctivae and EOM are normal.  Neck: Neck supple. No tracheal deviation present.  Cardiovascular: Normal rate, regular rhythm and normal heart sounds.   Pulmonary/Chest: Effort normal and breath sounds normal. No respiratory distress.  Abdominal: Soft. There is tenderness in the right lower quadrant. There is no rebound and no guarding.  Mild tenderness RLQ  Musculoskeletal: Normal range of motion.  Neurological: She is alert and oriented to person, place, and time.  Skin: Skin is warm and dry.  Psychiatric: She has a normal mood and affect. Her behavior is normal.  Nursing note and vitals reviewed.   ED Course  Procedures (including critical care time)  DIAGNOSTIC STUDIES: Oxygen Saturation is 100% on room air, normal by my interpretation.    COORDINATION OF CARE: At 2314  Discussed treatment plan with patient. Patient agrees.   Labs Review Labs Reviewed  URINALYSIS, ROUTINE W REFLEX MICROSCOPIC - Abnormal; Notable for the following:    Specific Gravity, Urine >1.030 (*)    Hgb urine dipstick TRACE (*)    All other components within normal limits  URINE MICROSCOPIC-ADD ON - Abnormal; Notable for the following:    Squamous Epithelial / LPF FEW (*)    Bacteria, UA FEW (*)    All other components within normal limits  BASIC METABOLIC PANEL - Abnormal; Notable for the following:    Glucose, Bld 114 (*)    All other components within normal limits  PREGNANCY, URINE  CBC WITH DIFFERENTIAL    Imaging Review No results found.   EKG Interpretation None      MDM   Final diagnoses:  Abdominal pain, unspecified abdominal location  Non-intractable vomiting with nausea, vomiting of unspecified type    23 year old female with crampy lower abdominal pain. She is very mild tenderness right lower quadrant without rebound or guarding. She is not pregnant. No dysuria. Consider appendicitis, but overall my suspicion for this is very low. Doubt ovarian torsion. Feel she is stable for discharge at this time. Transferred to medical treatment. Return precautions were discussed.  I personally preformed the services scribed in my presence. The recorded information has been reviewed is accurate. Raeford RazorStephen Lajeana Strough, MD.    Raeford RazorStephen Joory Gough, MD 11/07/14 [phone]

## 2014-10-25 LAB — BASIC METABOLIC PANEL
Anion gap: 13 (ref 5–15)
BUN: 9 mg/dL (ref 6–23)
CO2: 22 mEq/L (ref 19–32)
Calcium: 9.4 mg/dL (ref 8.4–10.5)
Chloride: 103 mEq/L (ref 96–112)
Creatinine, Ser: 0.61 mg/dL (ref 0.50–1.10)
GFR calc Af Amer: >90 mL/min (ref >90)
GFR calc non Af Amer: >90 mL/min (ref >90)
Glucose, Bld: 114 mg/dL — ABNORMAL HIGH (ref 70–99)
Potassium: 4.1 mEq/L (ref 3.7–5.3)
Sodium: 138 mEq/L (ref 137–147)

## 2014-10-25 LAB — CBC WITH DIFFERENTIAL/PLATELET
Basophils Absolute: 0 10*3/uL (ref 0.0–0.1)
Basophils Relative: 0 % (ref 0–1)
Eosinophils Absolute: 0.2 10*3/uL (ref 0.0–0.7)
Eosinophils Relative: 2 % (ref 0–5)
HCT: 38.2 % (ref 36.0–46.0)
Hemoglobin: 12.6 g/dL (ref 12.0–15.0)
Lymphocytes Relative: 36 % (ref 12–46)
Lymphs Abs: 3.8 10*3/uL (ref 0.7–4.0)
MCH: 29 pg (ref 26.0–34.0)
MCHC: 33 g/dL (ref 30.0–36.0)
MCV: 88 fL (ref 78.0–100.0)
Monocytes Absolute: 0.5 10*3/uL (ref 0.1–1.0)
Monocytes Relative: 5 % (ref 3–12)
Neutro Abs: 6 10*3/uL (ref 1.7–7.7)
Neutrophils Relative %: 57 % (ref 43–77)
Platelets: 238 10*3/uL (ref 150–400)
RBC: 4.34 MIL/uL (ref 3.87–5.11)
RDW: 12.9 % (ref 11.5–15.5)
WBC: 10.5 10*3/uL (ref 4.0–10.5)

## 2014-10-25 MED ORDER — ONDANSETRON HCL 4 MG PO TABS: 4.0000 mg | ORAL_TABLET | Freq: Four times a day (QID) | ORAL | Status: DC

## 2014-10-25 MED ORDER — TRAMADOL HCL 50 MG PO TABS: 50.0000 mg | ORAL_TABLET | Freq: Four times a day (QID) | ORAL | Status: DC | PRN

## 2014-10-25 NOTE — Discharge Instructions (Signed)
Abdominal Pain, Women °Abdominal (stomach, pelvic, or belly) pain can be caused by many things. It is important to tell your doctor: °· The location of the pain. °· Does it come and go or is it present all the time? °· Are there things that start the pain (eating certain foods, exercise)? °· Are there other symptoms associated with the pain (fever, nausea, vomiting, diarrhea)? °All of this is helpful to know when trying to find the cause of the pain. °CAUSES  °· Stomach: virus or bacteria infection, or ulcer. °· Intestine: appendicitis (inflamed appendix), regional ileitis (Crohn's disease), ulcerative colitis (inflamed colon), irritable bowel syndrome, diverticulitis (inflamed diverticulum of the colon), or cancer of the stomach or intestine. °· Gallbladder disease or stones in the gallbladder. °· Kidney disease, kidney stones, or infection. °· Pancreas infection or cancer. °· Fibromyalgia (pain disorder). °· Diseases of the female organs: °· Uterus: fibroid (non-cancerous) tumors or infection. °· Fallopian tubes: infection or tubal pregnancy. °· Ovary: cysts or tumors. °· Pelvic adhesions (scar tissue). °· Endometriosis (uterus lining tissue growing in the pelvis and on the pelvic organs). °· Pelvic congestion syndrome (female organs filling up with blood just before the menstrual period). °· Pain with the menstrual period. °· Pain with ovulation (producing an egg). °· Pain with an IUD (intrauterine device, birth control) in the uterus. °· Cancer of the female organs. °· Functional pain (pain not caused by a disease, may improve without treatment). °· Psychological pain. °· Depression. °DIAGNOSIS  °Your doctor will decide the seriousness of your pain by doing an examination. °· Blood tests. °· X-rays. °· Ultrasound. °· CT scan (computed tomography, special type of X-ray). °· MRI (magnetic resonance imaging). °· Cultures, for infection. °· Barium enema (dye inserted in the large intestine, to better view it with  X-rays). °· Colonoscopy (looking in intestine with a lighted tube). °· Laparoscopy (minor surgery, looking in abdomen with a lighted tube). °· Major abdominal exploratory surgery (looking in abdomen with a large incision). °TREATMENT  °The treatment will depend on the cause of the pain.  °· Many cases can be observed and treated at home. °· Over-the-counter medicines recommended by your caregiver. °· Prescription medicine. °· Antibiotics, for infection. °· Birth control pills, for painful periods or for ovulation pain. °· Hormone treatment, for endometriosis. °· Nerve blocking injections. °· Physical therapy. °· Antidepressants. °· Counseling with a psychologist or psychiatrist. °· Minor or major surgery. °HOME CARE INSTRUCTIONS  °· Do not take laxatives, unless directed by your caregiver. °· Take over-the-counter pain medicine only if ordered by your caregiver. Do not take aspirin because it can cause an upset stomach or bleeding. °· Try a clear liquid diet (broth or water) as ordered by your caregiver. Slowly move to a bland diet, as tolerated, if the pain is related to the stomach or intestine. °· Have a thermometer and take your temperature several times a day, and record it. °· Bed rest and sleep, if it helps the pain. °· Avoid sexual intercourse, if it causes pain. °· Avoid stressful situations. °· Keep your follow-up appointments and tests, as your caregiver orders. °· If the pain does not go away with medicine or surgery, you may try: °· Acupuncture. °· Relaxation exercises (yoga, meditation). °· Group therapy. °· Counseling. °SEEK MEDICAL CARE IF:  °· You notice certain foods cause stomach pain. °· Your home care treatment is not helping your pain. °· You need stronger pain medicine. °· You want your IUD removed. °· You feel faint or   lightheaded. °· You develop nausea and vomiting. °· You develop a rash. °· You are having side effects or an allergy to your medicine. °SEEK IMMEDIATE MEDICAL CARE IF:  °· Your  pain does not go away or gets worse. °· You have a fever. °· Your pain is felt only in portions of the abdomen. The right side could possibly be appendicitis. The left lower portion of the abdomen could be colitis or diverticulitis. °· You are passing blood in your stools (bright red or black tarry stools, with or without vomiting). °· You have blood in your urine. °· You develop chills, with or without a fever. °· You pass out. °MAKE SURE YOU:  °· Understand these instructions. °· Will watch your condition. °· Will get help right away if you are not doing well or get worse. °Document Released: 08/22/2007 Document Revised: 03/11/2014 Document Reviewed: 09/11/2009 °[employer]® Patient Information ©2015 [employer]. This information is not intended to replace advice given to you by your health care provider. Make sure you discuss any questions you have with your health care provider. ° °Nausea and Vomiting °Nausea is a sick feeling that often comes before throwing up (vomiting). Vomiting is a reflex where stomach contents come out of your mouth. Vomiting can cause severe loss of body fluids (dehydration). Children and elderly adults can become dehydrated quickly, especially if they also have diarrhea. Nausea and vomiting are symptoms of a condition or disease. It is important to find the cause of your symptoms. °CAUSES  °· Direct irritation of the stomach lining. This irritation can result from increased acid production (gastroesophageal reflux disease), infection, food poisoning, taking certain medicines (such as nonsteroidal anti-inflammatory drugs), alcohol use, or tobacco use. °· Signals from the brain. These signals could be caused by a headache, heat exposure, an inner ear disturbance, increased pressure in the brain from injury, infection, a tumor, or a concussion, pain, emotional stimulus, or metabolic problems. °· An obstruction in the gastrointestinal tract (bowel obstruction). °· Illnesses such as diabetes,  hepatitis, gallbladder problems, appendicitis, kidney problems, cancer, sepsis, atypical symptoms of a heart attack, or eating disorders. °· Medical treatments such as chemotherapy and radiation. °· Receiving medicine that makes you sleep (general anesthetic) during surgery. °DIAGNOSIS °Your caregiver may ask for tests to be done if the problems do not improve after a few days. Tests may also be done if symptoms are severe or if the reason for the nausea and vomiting is not clear. Tests may include: °· Urine tests. °· Blood tests. °· Stool tests. °· Cultures (to look for evidence of infection). °· X-rays or other imaging studies. °Test results can help your caregiver make decisions about treatment or the need for additional tests. °TREATMENT °You need to stay well hydrated. Drink frequently but in small amounts. You may wish to drink water, sports drinks, clear broth, or eat frozen ice pops or gelatin dessert to help stay hydrated. When you eat, eating slowly may help prevent nausea. There are also some antinausea medicines that may help prevent nausea. °HOME CARE INSTRUCTIONS  °· Take all medicine as directed by your caregiver. °· If you do not have an appetite, do not force yourself to eat. However, you must continue to drink fluids. °· If you have an appetite, eat a normal diet unless your caregiver tells you differently. °¨ Eat a variety of complex carbohydrates (rice, wheat, potatoes, bread), lean meats, yogurt, fruits, and vegetables. °¨ Avoid high-fat foods because they are more difficult to digest. °· Drink enough water and fluids   to keep your urine clear or pale yellow. °· If you are dehydrated, ask your caregiver for specific rehydration instructions. Signs of dehydration may include: °¨ Severe thirst. °¨ Dry lips and mouth. °¨ Dizziness. °¨ Dark urine. °¨ Decreasing urine frequency and amount. °¨ Confusion. °¨ Rapid breathing or pulse. °SEEK IMMEDIATE MEDICAL CARE IF:  °· You have blood or brown flecks  (like coffee grounds) in your vomit. °· You have black or bloody stools. °· You have a severe headache or stiff neck. °· You are confused. °· You have severe abdominal pain. °· You have chest pain or trouble breathing. °· You do not urinate at least once every 8 hours. °· You develop cold or clammy skin. °· You continue to vomit for longer than 24 to 48 hours. °· You have a fever. °MAKE SURE YOU:  °· Understand these instructions. °· Will watch your condition. °· Will get help right away if you are not doing well or get worse. °Document Released: 10/25/2005 Document Revised: 01/17/2012 Document Reviewed: 03/24/2011 °Exit[employer]® Patient Information ©2015 [employer]. This information is not intended to replace advice given to you by your health care provider. Make sure you discuss any questions you have with your health care provider. ° °

## 2015-02-28 ENCOUNTER — Ambulatory Visit (INDEPENDENT_AMBULATORY_CARE_PROVIDER_SITE_OTHER): Payer: BLUE CROSS/BLUE SHIELD | Admitting: Neurology

## 2015-02-28 ENCOUNTER — Encounter: Payer: Self-pay | Admitting: Neurology

## 2015-02-28 ENCOUNTER — Ambulatory Visit: Payer: BLUE CROSS/BLUE SHIELD | Admitting: Neurology

## 2015-02-28 VITALS — BP 132/68 | HR 88 | Resp 20 | Ht 62.0 in | Wt 202.8 lb

## 2015-02-28 DIAGNOSIS — G4441 Drug-induced headache, not elsewhere classified, intractable: Secondary | ICD-10-CM | POA: Diagnosis not present

## 2015-02-28 DIAGNOSIS — G4440 Drug-induced headache, not elsewhere classified, not intractable: Secondary | ICD-10-CM

## 2015-02-28 DIAGNOSIS — G43119 Migraine with aura, intractable, without status migrainosus: Secondary | ICD-10-CM | POA: Insufficient documentation

## 2015-02-28 DIAGNOSIS — N943 Premenstrual tension syndrome: Secondary | ICD-10-CM

## 2015-02-28 DIAGNOSIS — G43839 Menstrual migraine, intractable, without status migrainosus: Secondary | ICD-10-CM

## 2015-02-28 MED ORDER — TOPIRAMATE 100 MG PO TABS: 150.0000 mg | ORAL_TABLET | Freq: Every day | ORAL | Status: DC

## 2015-02-28 MED ORDER — SUMATRIPTAN SUCCINATE 100 MG PO TABS: ORAL_TABLET | ORAL | Status: DC

## 2015-02-28 NOTE — Patient Instructions (Signed)
1.  Increase topiramate to 150mg  at bedtime.  Give this dose 4 weeks and then call with update.  We can adjust dose if needed. 2.  Stop ibuprofen and tylenol! 3.  At earliest onset of migraine headache, take sumatriptan 100mg .  May repeat one tablet in 2 hours if needed.  Do not exceed 2 tablets in 24 hours.  Limit use and use of all pain relievers to no more than 2 days out of the week. 4.  Stop caffeine 5.  Follow sleep hygiene tips 6.  Stop smoking 7.  Increase exercise 8.  Follow up in 3 months.
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
## 2015-03-27 ENCOUNTER — Telehealth: Payer: Self-pay | Admitting: Neurology

## 2015-03-27 NOTE — Telephone Encounter (Signed)
Pt called and would like a call back in regards to medication/Dawn

## 2015-03-27 NOTE — Telephone Encounter (Signed)
I spoke with patient she thinks she needs to move up to 200 mg of Topamax she is at 150 now .

## 2015-03-27 NOTE — Telephone Encounter (Signed)
Okay.  Increase to 200mg  at bedtime.  She should contact us in 4 weeks.  I would like to know how many times a month she is now having a migraine.

## 2015-03-28 ENCOUNTER — Other Ambulatory Visit: Payer: Self-pay | Admitting: *Deleted

## 2015-03-28 MED ORDER — TOPIRAMATE 50 MG PO TABS
ORAL_TABLET | ORAL | Status: DC
Start: 2015-03-28 — End: 2015-08-22

## 2015-03-28 MED ORDER — TOPIRAMATE 100 MG PO TABS: 150.0000 mg | ORAL_TABLET | Freq: Every day | ORAL | Status: DC

## 2015-05-30 ENCOUNTER — Ambulatory Visit (INDEPENDENT_AMBULATORY_CARE_PROVIDER_SITE_OTHER): Payer: BLUE CROSS/BLUE SHIELD | Admitting: Neurology

## 2015-05-30 ENCOUNTER — Encounter: Payer: Self-pay | Admitting: Neurology

## 2015-05-30 VITALS — BP 118/70 | HR 88 | Resp 20 | Ht 62.0 in | Wt 191.6 lb

## 2015-05-30 DIAGNOSIS — G44229 Chronic tension-type headache, not intractable: Secondary | ICD-10-CM | POA: Diagnosis not present

## 2015-05-30 DIAGNOSIS — G43009 Migraine without aura, not intractable, without status migrainosus: Secondary | ICD-10-CM

## 2015-05-30 DIAGNOSIS — Z720 Tobacco use: Secondary | ICD-10-CM | POA: Insufficient documentation
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
## 2015-05-30 NOTE — Patient Instructions (Signed)
Migraine Recommendations: 1.  Continue topamax  at bedtime 2.  Take sumatriptan  at earliest onset of headache.  May repeat dose once in 2 hours if needed.  Do not exceed two tablets in 24 hours.  Keep it on you in case you have a migraine outside of the house. 3.  Limit use of pain relievers to no more than 2 days out of the week.  These medications include acetaminophen, ibuprofen, triptans and narcotics.  This will help reduce risk of rebound headaches. 4.  Be aware of common food triggers such as processed sweets, processed foods with nitrites (such as deli meat, hot dogs, sausages), foods with MSG, alcohol (such as wine), chocolate, certain cheeses, certain fruits (dried fruits, some citrus fruit), vinegar, diet soda. 4.  Avoid caffeine 5.  Routine exercise 6.  Proper sleep hygiene 7.  Stay adequately hydrated with water 8.  Keep a headache diary. 9.  Maintain proper stress management. 10.  Do not skip meals. 11.  Follow up in 3 months.

## 2015-08-22 ENCOUNTER — Other Ambulatory Visit: Payer: Self-pay | Admitting: Neurology

## 2015-08-22 NOTE — Telephone Encounter (Signed)
Rx sent 

## 2015-09-15 ENCOUNTER — Telehealth: Payer: Self-pay

## 2015-09-15 NOTE — Telephone Encounter (Signed)
She can take 100mg  for one day, then 50mg  for one day and then stop.  She may not feel well for a few days because we are tapering it off quickly, but I would like to get her off of it as soon as possible.

## 2015-09-15 NOTE — Telephone Encounter (Signed)
Attempting to reach. Main number disconnected. Emergency contact number tried, was told I had the wrong number.

## 2015-09-15 NOTE — Telephone Encounter (Signed)
She should also stop the sumatriptan.  While pregnant, she probably is only able to take Tylenol, but I would ask her OBGYN.

## 2015-09-15 NOTE — Telephone Encounter (Signed)
Pt is on Topamax. Just found out she is pregnant. Medication bottle said to informed provider if she became pregnant. Should she d/c medication? Titration down needed? Please advise.

## 2015-09-15 NOTE — Telephone Encounter (Signed)
See below

## 2015-09-18 ENCOUNTER — Telehealth: Payer: Self-pay | Admitting: Neurology

## 2015-09-18 NOTE — Telephone Encounter (Signed)
Pt has become pregnant, and not sure if she should take meds/call back @ [phone]

## 2015-09-18 NOTE — Telephone Encounter (Signed)
Please advise 

## 2015-09-19 ENCOUNTER — Other Ambulatory Visit: Payer: Self-pay | Admitting: Obstetrics & Gynecology

## 2015-09-19 ENCOUNTER — Ambulatory Visit: Payer: BLUE CROSS/BLUE SHIELD | Admitting: Neurology

## 2015-09-19 DIAGNOSIS — O3680X0 Pregnancy with inconclusive fetal viability, not applicable or unspecified: Secondary | ICD-10-CM

## 2015-09-19 NOTE — Telephone Encounter (Signed)
Message relayed to patient. Verbalized understanding and denied questions. Phone number was updated in chart.

## 2015-09-19 NOTE — Telephone Encounter (Signed)
As per my phone recommendation from a couple of days ago: She should stop topiramate (100mg  daily for one day, then 50mg  daily for one day, then stop) She should stop sumatriptan Instead, she may take magnesium oxide 400mg  daily When she gets a headache, I'm pretty sure she can only take Tylenol I believe she is scheduled to see me today.

## 2015-09-23 ENCOUNTER — Ambulatory Visit (INDEPENDENT_AMBULATORY_CARE_PROVIDER_SITE_OTHER): Payer: Medicaid Other

## 2015-09-23 ENCOUNTER — Other Ambulatory Visit: Payer: Self-pay | Admitting: Adult Health

## 2015-09-23 DIAGNOSIS — O3680X0 Pregnancy with inconclusive fetal viability, not applicable or unspecified: Secondary | ICD-10-CM

## 2015-09-23 NOTE — Progress Notes (Signed)
US 5+6 wks GS w/YS,no fetal pole seen,normal ov's bilat,pt will come back in 1 wk for f/u [employer],per Jennifer.

## 2015-10-01 ENCOUNTER — Ambulatory Visit (INDEPENDENT_AMBULATORY_CARE_PROVIDER_SITE_OTHER): Payer: Medicaid Other

## 2015-10-01 DIAGNOSIS — O3680X0 Pregnancy with inconclusive fetal viability, not applicable or unspecified: Secondary | ICD-10-CM

## 2015-10-01 NOTE — Progress Notes (Signed)
US 6+3wks single IUP w/ys, pos fht 138 bpm,normal ov's bilat,crl 5.274mm

## 2015-10-13 ENCOUNTER — Ambulatory Visit (INDEPENDENT_AMBULATORY_CARE_PROVIDER_SITE_OTHER): Payer: Medicaid Other | Admitting: Women's Health

## 2015-10-13 ENCOUNTER — Encounter: Payer: Self-pay | Admitting: Women's Health

## 2015-10-13 VITALS — BP 98/62 | HR 60 | Wt 181.0 lb

## 2015-10-13 DIAGNOSIS — Z369 Encounter for antenatal screening, unspecified: Secondary | ICD-10-CM

## 2015-10-13 DIAGNOSIS — Z3A08 8 weeks gestation of pregnancy: Secondary | ICD-10-CM | POA: Diagnosis not present

## 2015-10-13 DIAGNOSIS — Z3491 Encounter for supervision of normal pregnancy, unspecified, first trimester: Secondary | ICD-10-CM

## 2015-10-13 DIAGNOSIS — O09211 Supervision of pregnancy with history of pre-term labor, first trimester: Secondary | ICD-10-CM | POA: Diagnosis not present

## 2015-10-13 DIAGNOSIS — Z98891 History of uterine scar from previous surgery: Secondary | ICD-10-CM | POA: Insufficient documentation

## 2015-10-13 DIAGNOSIS — Z0283 Encounter for blood-alcohol and blood-drug test: Secondary | ICD-10-CM

## 2015-10-13 DIAGNOSIS — Z331 Pregnant state, incidental: Secondary | ICD-10-CM

## 2015-10-13 DIAGNOSIS — O34219 Maternal care for unspecified type scar from previous cesarean delivery: Secondary | ICD-10-CM

## 2015-10-13 DIAGNOSIS — Z3490 Encounter for supervision of normal pregnancy, unspecified, unspecified trimester: Secondary | ICD-10-CM | POA: Insufficient documentation

## 2015-10-13 DIAGNOSIS — O09891 Supervision of other high risk pregnancies, first trimester: Secondary | ICD-10-CM

## 2015-10-13 DIAGNOSIS — Z1389 Encounter for screening for other disorder: Secondary | ICD-10-CM

## 2015-10-13 DIAGNOSIS — Z8751 Personal history of pre-term labor: Secondary | ICD-10-CM | POA: Insufficient documentation

## 2015-10-13 LAB — POCT URINALYSIS DIPSTICK
GLUCOSE UA: NEGATIVE
KETONES UA: NEGATIVE
Leukocytes, UA: NEGATIVE
Nitrite, UA: NEGATIVE
Protein, UA: NEGATIVE
RBC UA: NEGATIVE

## 2015-10-13 NOTE — Patient Instructions (Signed)

## 2015-10-13 NOTE — Progress Notes (Signed)
  Subjective:  Katherine Villegas is a 24 y.o. [phone] Caucasian female at 2384w1d by LMP c/w 6wks u/s, being seen today for her first obstetrical visit.  Her obstetrical history is significant for 32wk PPROM w/ 34c/s d/t breech, SAB x 2.  Pregnancy history fully reviewed. Wants BTL  Patient reports some constipation. Denies vb, cramping, uti s/s, abnormal/malodorous vag d/c, or vulvovaginal itching/irritation.  BP 98/62 mmHg  Pulse 60  Wt 181 lb (82.101 kg)  LMP 08/17/2015 (Exact Date)  HISTORY: OB History  Gravida Para Term Preterm AB SAB TAB Ectopic Multiple Living  4 1  1 2 2    1     # Outcome Date GA Lbr Len/2nd Weight Sex Delivery Anes PTL Lv  4 Current           3 Preterm 08/23/10 10430w0d  4 lb 9 oz (2.07 kg) F CS-Unspec  Y Y  2 SAB           1 SAB              Past Medical History  Diagnosis Date  . Headache    Past Surgical History  Procedure Laterality Date  . Cesarean section    . External ear surgery    . Tonsillectomy     Family History  Problem Relation Age of Onset  . Migraines Mother   . Hypertension Mother   . Hyperlipidemia Mother   . Alcohol abuse Mother   . Diabetes Maternal Grandfather   . Alcohol abuse Father   . Arthritis Maternal Grandmother   . COPD Paternal Grandmother     Exam   System:     General: Well developed & nourished, no acute distress   Skin: Warm & dry, normal coloration and turgor, no rashes   Neurologic: Alert & oriented, normal mood   Cardiovascular: Regular rate & rhythm   Respiratory: Effort & rate normal, LCTAB, acyanotic   Abdomen: Soft, non tender   Extremities: normal strength, tone  Thin prep pap smear neg 2014   Assessment:   Pregnancy: [id number] Patient Active Problem List   Diagnosis Date Noted  . Previous cesarean section complicating pregnancy 10/13/2015  . H/O preterm delivery, currently pregnant 10/13/2015  . Tobacco abuse 05/30/2015  . Chronic tension-type headache, not intractable 05/30/2015  . Intractable  migraine with aura without status migrainosus 02/28/2015  . Intractable menstrual migraine without status migrainosus 02/28/2015    2484w1d [id number] New OB visit Constipation H/O 32wk PPROM w/ 34wk del H/O C/s d/t breech   Plan:  Initial labs drawn Continue prenatal vitamins Problem list reviewed and updated Reviewed n/v relief measures and warning s/s to report Reviewed recommended weight gain based on pre-gravid BMI Encouraged well-balanced diet Genetic Screening discussed Integrated Screen: requested Cystic fibrosis screening discussed declined Ultrasound discussed; fetal survey: requested Follow up in 4 weeks for 1st IT/NT and visit [facility] completed Declined flu shot [employer] Makena, accepted, ordered today, begin @ 16wks Offered VBAC, consent given to take home and review  Marge DuncansBooker, Leighanne Adolph Randall CNM, [facility]NP-BC 10/13/2015 10:28 AM

## 2015-10-14 LAB — URINALYSIS, ROUTINE W REFLEX MICROSCOPIC
Bilirubin, UA: NEGATIVE
Glucose, UA: NEGATIVE
Ketones, UA: NEGATIVE
Leukocytes, UA: NEGATIVE
NITRITE UA: NEGATIVE
PH UA: 8 — AB (ref 5.0–7.5)
Protein, UA: NEGATIVE
RBC, UA: NEGATIVE
Specific Gravity, UA: 1.025 (ref 1.005–1.030)
UUROB: 0.2 mg/dL (ref 0.2–1.0)

## 2015-10-14 LAB — CBC
HEMOGLOBIN: 13.2 g/dL (ref 11.1–15.9)
Hematocrit: 38.6 % (ref 34.0–46.6)
MCH: 30 pg (ref 26.6–33.0)
MCHC: 34.2 g/dL (ref 31.5–35.7)
MCV: 88 fL (ref 79–97)
PLATELETS: 255 10*3/uL (ref 150–379)
RBC: 4.4 x10E6/uL (ref 3.77–5.28)
RDW: 14 % (ref 12.3–15.4)
WBC: 10.1 10*3/uL (ref 3.4–10.8)

## 2015-10-14 LAB — HIV ANTIBODY (ROUTINE TESTING W REFLEX): HIV SCREEN 4TH GENERATION: NONREACTIVE

## 2015-10-14 LAB — PMP SCREEN PROFILE (10S), URINE
Amphetamine Screen, Ur: NEGATIVE ng/mL
BARBITURATE SCRN UR: NEGATIVE ng/mL
BENZODIAZEPINE SCREEN, URINE: NEGATIVE ng/mL
COCAINE(METAB.) SCREEN, URINE: NEGATIVE ng/mL
Cannabinoids Ur Ql Scn: NEGATIVE ng/mL
Creatinine(Crt), U: 157.2 mg/dL (ref 20.0–300.0)
METHADONE SCREEN, URINE: NEGATIVE ng/mL
OPIATE SCRN UR: NEGATIVE ng/mL
Oxycodone+Oxymorphone Ur Ql Scn: NEGATIVE ng/mL
PCP Scrn, Ur: NEGATIVE ng/mL
Ph of Urine: 7.6 (ref 4.5–8.9)
Propoxyphene, Screen: NEGATIVE ng/mL

## 2015-10-14 LAB — ANTIBODY SCREEN: ANTIBODY SCREEN: NEGATIVE

## 2015-10-14 LAB — ABO/RH: RH TYPE: POSITIVE

## 2015-10-14 LAB — RPR: RPR Ser Ql: NONREACTIVE

## 2015-10-14 LAB — GC/CHLAMYDIA PROBE AMP
CHLAMYDIA, DNA PROBE: NEGATIVE
NEISSERIA GONORRHOEAE BY PCR: NEGATIVE

## 2015-10-14 LAB — VARICELLA ZOSTER ANTIBODY, IGG: VARICELLA: 1182 {index} (ref >165)

## 2015-10-14 LAB — HEPATITIS B SURFACE ANTIGEN: HEP B S AG: NEGATIVE

## 2015-10-14 LAB — RUBELLA SCREEN: RUBELLA: 3.15 {index} (ref >0.99)

## 2015-10-14 LAB — URINE CULTURE: ORGANISM ID, BACTERIA: NO GROWTH

## 2015-11-07 ENCOUNTER — Other Ambulatory Visit: Payer: Self-pay | Admitting: Women's Health

## 2015-11-07 DIAGNOSIS — Z3682 Encounter for antenatal screening for nuchal translucency: Secondary | ICD-10-CM

## 2015-11-09 NOTE — L&D Delivery Note (Signed)
Delivery Note At 12:07 PM a viable female was delivered via VBAC, Spontaneous (Presentation: Right Occiput Anterior).  APGAR: 8, 9; weight: pending .   Placenta status: spont , intact .  Cord: 3 vessel; cord around body, reduced after delivery  Anesthesia: Epidural  Episiotomy: None Lacerations: 2st degree; Perineal Suture Repair: 3.0 vicryl Est. Blood Loss (mL):  150  Mom to postpartum.  Baby to Couplet care / Skin to Skin.  Cam HaiSHAW, Sapphire Tygart CNM 05/19/2016, 12:34 PM

## 2015-11-12 ENCOUNTER — Encounter: Payer: Self-pay | Admitting: Women's Health

## 2015-11-12 ENCOUNTER — Ambulatory Visit (INDEPENDENT_AMBULATORY_CARE_PROVIDER_SITE_OTHER): Payer: Medicaid Other

## 2015-11-12 ENCOUNTER — Ambulatory Visit (INDEPENDENT_AMBULATORY_CARE_PROVIDER_SITE_OTHER): Payer: Medicaid Other | Admitting: Women's Health

## 2015-11-12 VITALS — BP 98/56 | HR 68 | Wt 184.0 lb

## 2015-11-12 DIAGNOSIS — Z36 Encounter for antenatal screening of mother: Secondary | ICD-10-CM

## 2015-11-12 DIAGNOSIS — Z1389 Encounter for screening for other disorder: Secondary | ICD-10-CM

## 2015-11-12 DIAGNOSIS — Z3491 Encounter for supervision of normal pregnancy, unspecified, first trimester: Secondary | ICD-10-CM

## 2015-11-12 DIAGNOSIS — Z3682 Encounter for antenatal screening for nuchal translucency: Secondary | ICD-10-CM

## 2015-11-12 DIAGNOSIS — Z331 Pregnant state, incidental: Secondary | ICD-10-CM

## 2015-11-12 LAB — POCT URINALYSIS DIPSTICK
Blood, UA: NEGATIVE
GLUCOSE UA: NEGATIVE
KETONES UA: NEGATIVE
LEUKOCYTES UA: NEGATIVE
Nitrite, UA: NEGATIVE
Protein, UA: NEGATIVE

## 2015-11-12 NOTE — Progress Notes (Signed)
US 12+3wks,measurements c/w dates,fhr 156 bpm,NB present,NT 2.143mm,normal ov's bilat,post pl gr 0,crl 65.538mm

## 2015-11-12 NOTE — Progress Notes (Signed)
Low-risk OB appointment [id number] 9158w3d Estimated Date of Delivery: 05/23/16 BP 98/56 mmHg  Pulse 68  Wt 184 lb (83.462 kg)  LMP 08/17/2015 (Exact Date)  BP, weight, and urine reviewed.  Refer to obstetrical flow sheet for FH & FHR.  No fm yet. Denies cramping, lof, vb, or uti s/s. Constipation, migraines coming back- was taking topamax prior to pregnancy- advised can not take during pregnancy. Gave printed list of ha prevention/relief measures- if not helping call and will rx fioricet. Gave printed constipation relief measures.  Reviewed today's normal nt u/s, warning s/s to report. Plan:  Continue routine obstetrical care  F/U in 4wks for OB appointment, 2nd IT, and to begin weekly 17P (hers is not here today, will call and check on it) 1st IT/NT today

## 2015-11-12 NOTE — Patient Instructions (Signed)
Constipation  Drink plenty of fluid, preferably water, throughout the day  Eat foods high in fiber such as fruits, vegetables, and grains  Exercise, such as walking, is a good way to keep your bowels regular  Drink warm fluids, especially warm prune juice, or decaf coffee  Eat a 1/2 cup of real oatmeal (not instant), 1/2 cup applesauce, and 1/2-1 cup warm prune juice every day  If needed, you may take Colace (docusate sodium) stool softener once or twice a day to help keep the stool soft. If you are pregnant, wait until you are out of your first trimester (12-14 weeks of pregnancy)  If you still are having problems with constipation, you may take Miralax once daily as needed to help keep your bowels regular.  If you are pregnant, wait until you are out of your first trimester (12-14 weeks of pregnancy)   For Headaches:   Stay well hydrated, drink enough water so that your urine is clear, sometimes if you are dehydrated you can get headaches  Eat small frequent meals and snacks, sometimes if you are hungry you can get headaches  Sometimes you get headaches during pregnancy from the pregnancy hormones  You can try tylenol (1-2 regular strength 325mg  or 1-2 extra strength 500mg ) as directed on the box. The least amount of medication that works is best.   Cool compresses (Immunologistcool wet washcloth or ice pack) to area of head that is hurting  You can also try drinking a caffeinated drink to see if this will help Call us if these things aren't helping your headaches   For Prevention of Headaches/Migraines:  CoQ10 100mg  three times daily  Vitamin B2 400mg  daily  Magnesium Oxide 400-600mg  daily  If You Get a Bad Headache/Migraine:  Benadryl 25mg    Magnesium Oxide  1 large Gatorade  2 extra strength Tylenol (1,000mg  total)  1 cup coffee or Coke  If this doesn't help please call us @ [phone]   Second Trimester of Pregnancy The second trimester is from week 13 through week  28, months 4 through 6. The second trimester is often a time when you feel your best. Your body has also adjusted to being pregnant, and you begin to feel better physically. Usually, morning sickness has lessened or quit completely, you may have more energy, and you may have an increase in appetite. The second trimester is also a time when the fetus is growing rapidly. At the end of the sixth month, the fetus is about 9 inches long and weighs about 1 pounds. You will likely begin to feel the baby move (quickening) between 18 and 20 weeks of the pregnancy. BODY CHANGES Your body goes through many changes during pregnancy. The changes vary from woman to woman.   Your weight will continue to increase. You will notice your lower abdomen bulging out.  You may begin to get stretch marks on your hips, abdomen, and breasts.  You may develop headaches that can be relieved by medicines approved by your health care provider.  You may urinate more often because the fetus is pressing on your bladder.  You may develop or continue to have heartburn as a result of your pregnancy.  You may develop constipation because certain hormones are causing the muscles that push waste through your intestines to slow down.  You may develop hemorrhoids or swollen, bulging veins (varicose veins).  You may have back pain because of the weight gain and pregnancy hormones relaxing your joints between the bones in  your pelvis and as a result of a shift in weight and the muscles that support your balance.  Your breasts will continue to grow and be tender.  Your gums may bleed and may be sensitive to brushing and flossing.  Dark spots or blotches (chloasma, mask of pregnancy) may develop on your face. This will likely fade after the baby is born.  A dark line from your belly button to the pubic area (linea nigra) may appear. This will likely fade after the baby is born.  You may have changes in your hair. These can include  thickening of your hair, rapid growth, and changes in texture. Some women also have hair loss during or after pregnancy, or hair that feels dry or thin. Your hair will most likely return to normal after your baby is born. WHAT TO EXPECT AT YOUR PRENATAL VISITS During a routine prenatal visit:  You will be weighed to make sure you and the fetus are growing normally.  Your blood pressure will be taken.  Your abdomen will be measured to track your baby's growth.  The fetal heartbeat will be listened to.  Any test results from the previous visit will be discussed. Your health care provider may ask you:  How you are feeling.  If you are feeling the baby move.  If you have had any abnormal symptoms, such as leaking fluid, bleeding, severe headaches, or abdominal cramping.  If you are using any tobacco products, including cigarettes, chewing tobacco, and electronic cigarettes.  If you have any questions. Other tests that may be performed during your second trimester include:  Blood tests that check for:  Low iron levels (anemia).  Gestational diabetes (between 24 and 28 weeks).  Rh antibodies.  Urine tests to check for infections, diabetes, or protein in the urine.  An ultrasound to confirm the proper growth and development of the baby.  An amniocentesis to check for possible genetic problems.  Fetal screens for spina bifida and Down syndrome.  HIV (human immunodeficiency virus) testing. Routine prenatal testing includes screening for HIV, unless you choose not to have this test. HOME CARE INSTRUCTIONS   Avoid all smoking, herbs, alcohol, and unprescribed drugs. These chemicals affect the formation and growth of the baby.  Do not use any tobacco products, including cigarettes, chewing tobacco, and electronic cigarettes. If you need help quitting, ask your health care provider. You may receive counseling support and other resources to help you quit.  Follow your health care  provider's instructions regarding medicine use. There are medicines that are either safe or unsafe to take during pregnancy.  Exercise only as directed by your health care provider. Experiencing uterine cramps is a good sign to stop exercising.  Continue to eat regular, healthy meals.  Wear a good support bra for breast tenderness.  Do not use hot tubs, steam rooms, or saunas.  Wear your seat belt at all times when driving.  Avoid raw meat, uncooked cheese, cat litter boxes, and soil used by cats. These carry germs that can cause birth defects in the baby.  Take your prenatal vitamins.  Take 1500-2000 mg of calcium daily starting at the 20th week of pregnancy until you deliver your baby.  Try taking a stool softener (if your health care provider approves) if you develop constipation. Eat more high-fiber foods, such as fresh vegetables or fruit and whole grains. Drink plenty of fluids to keep your urine clear or pale yellow.  Take warm sitz baths to soothe any pain  or discomfort caused by hemorrhoids. Use hemorrhoid cream if your health care provider approves.  If you develop varicose veins, wear support hose. Elevate your feet for 15 minutes, 3-4 times a day. Limit salt in your diet.  Avoid heavy lifting, wear low heel shoes, and practice good posture.  Rest with your legs elevated if you have leg cramps or low back pain.  Visit your dentist if you have not gone yet during your pregnancy. Use a soft toothbrush to brush your teeth and be gentle when you floss.  A sexual relationship may be continued unless your health care provider directs you otherwise.  Continue to go to all your prenatal visits as directed by your health care provider. SEEK MEDICAL CARE IF:   You have dizziness.  You have mild pelvic cramps, pelvic pressure, or nagging pain in the abdominal area.  You have persistent nausea, vomiting, or diarrhea.  You have a bad smelling vaginal discharge.  You have pain  with urination. SEEK IMMEDIATE MEDICAL CARE IF:   You have a fever.  You are leaking fluid from your vagina.  You have spotting or bleeding from your vagina.  You have severe abdominal cramping or pain.  You have rapid weight gain or loss.  You have shortness of breath with chest pain.  You notice sudden or extreme swelling of your face, hands, ankles, feet, or legs.  You have not felt your baby move in over an hour.  You have severe headaches that do not go away with medicine.  You have vision changes.   This information is not intended to replace advice given to you by your health care provider. Make sure you discuss any questions you have with your health care provider.   Document Released: 10/19/2001 Document Revised: 11/15/2014 Document Reviewed: 12/26/2012 Elsevier Interactive Patient Education [employer]

## 2015-11-14 ENCOUNTER — Telehealth: Payer: Self-pay | Admitting: *Deleted

## 2015-11-14 LAB — MATERNAL SCREEN, INTEGRATED #1
CROWN RUMP LENGTH MAT SCREEN: 65.8 mm
GEST. AGE ON COLLECTION DATE: 12.7 wk
Maternal Age at EDD: 25.3 years
Nuchal Translucency (NT): 2.3 mm
Number of Fetuses: 1
PAPP-A VALUE: 266.7 ng/mL
Weight: 184 [lb_av]

## 2015-11-14 NOTE — Telephone Encounter (Signed)
Makena called stating that the pt's Medicaid would cover the medication and that [facility] the pt is around 15 wks and then the medication will be sent to our office.   If any questions call [phone] option 1

## 2015-12-05 ENCOUNTER — Telehealth: Payer: Self-pay | Admitting: *Deleted

## 2015-12-05 NOTE — Telephone Encounter (Signed)
Returned call to [facility] for PA - Makena. Form to be faxed and completed for PA [employer].

## 2015-12-10 ENCOUNTER — Ambulatory Visit (INDEPENDENT_AMBULATORY_CARE_PROVIDER_SITE_OTHER): Payer: BLUE CROSS/BLUE SHIELD | Admitting: Advanced Practice Midwife

## 2015-12-10 ENCOUNTER — Encounter: Payer: Self-pay | Admitting: Advanced Practice Midwife

## 2015-12-10 VITALS — BP 90/60 | HR 66 | Wt 183.0 lb

## 2015-12-10 DIAGNOSIS — Z1389 Encounter for screening for other disorder: Secondary | ICD-10-CM

## 2015-12-10 DIAGNOSIS — Z3492 Encounter for supervision of normal pregnancy, unspecified, second trimester: Secondary | ICD-10-CM

## 2015-12-10 DIAGNOSIS — Z363 Encounter for antenatal screening for malformations: Secondary | ICD-10-CM

## 2015-12-10 DIAGNOSIS — O09892 Supervision of other high risk pregnancies, second trimester: Secondary | ICD-10-CM

## 2015-12-10 DIAGNOSIS — O09212 Supervision of pregnancy with history of pre-term labor, second trimester: Secondary | ICD-10-CM

## 2015-12-10 DIAGNOSIS — Z3682 Encounter for antenatal screening for nuchal translucency: Secondary | ICD-10-CM

## 2015-12-10 DIAGNOSIS — Z331 Pregnant state, incidental: Secondary | ICD-10-CM

## 2015-12-10 LAB — POCT URINALYSIS DIPSTICK
GLUCOSE UA: NEGATIVE
Ketones, UA: NEGATIVE
LEUKOCYTES UA: NEGATIVE
Nitrite, UA: NEGATIVE
Protein, UA: NEGATIVE
RBC UA: NEGATIVE

## 2015-12-10 NOTE — Patient Instructions (Signed)

## 2015-12-10 NOTE — Progress Notes (Signed)
[id number] [redacted]w[redacted]d Estimated Date of Delivery: 05/23/16  Blood pressure 90/60, pulse 66, weight 183 lb (83.008 kg), last menstrual period 08/17/2015.   BP weight and urine results all reviewed and noted.  Please refer to the obstetrical flow sheet for the fundal height and fetal heart rate documentation:  Patient denies any bleeding and no rupture of membranes symptoms or regular contractions. Patient has heartburn (milk helps), not sleeping well. Has been using dulcolax twice a week. Discouraged this, start fiber/stool softners All questions were answered.  Orders Placed This Encounter  Procedures  . US OB Comp + 14 Wk  . Maternal Screen, Integrated #2  . POCT urinalysis dipstick    Plan:  Continued routine obstetrical care, 17 p not here.  Last note in chart says it need PA--Chrystal not here to verify that it was done.  Will ask her this afternoon, pt to call tomorrow to find out status.   Return in about 3 weeks (around 12/31/2015) for  AV:[id number], LROB. will start 17p when we get authorization

## 2015-12-12 LAB — MATERNAL SCREEN, INTEGRATED #2
AFP MARKER: 27.2 ng/mL
AFP MoM: 0.95
CROWN RUMP LENGTH: 65.8 mm
DIA MOM: 0.89
DIA VALUE: 135.5 pg/mL
ESTRIOL UNCONJUGATED: 0.91 ng/mL
GESTATIONAL AGE: 16.7 wk
Gest. Age on Collection Date: 12.7 weeks
Maternal Age at EDD: 25.3 years
NUCHAL TRANSLUCENCY (NT): 2.3 mm
NUCHAL TRANSLUCENCY MOM: 1.4
Number of Fetuses: 1
PAPP-A MOM: 0.32
PAPP-A Value: 266.7 ng/mL
TEST RESULTS: NEGATIVE
Weight: 184 [lb_av]
Weight: 184 [lb_av]
hCG MoM: 0.35
hCG Value: 9.2 IU/mL
uE3 MoM: 0.97

## 2015-12-24 ENCOUNTER — Encounter: Payer: Self-pay | Admitting: Obstetrics and Gynecology

## 2015-12-24 ENCOUNTER — Ambulatory Visit (INDEPENDENT_AMBULATORY_CARE_PROVIDER_SITE_OTHER): Payer: BLUE CROSS/BLUE SHIELD | Admitting: Obstetrics and Gynecology

## 2015-12-24 VITALS — BP 90/56 | HR 112 | Wt 183.0 lb

## 2015-12-24 DIAGNOSIS — K529 Noninfective gastroenteritis and colitis, unspecified: Secondary | ICD-10-CM | POA: Insufficient documentation

## 2015-12-24 DIAGNOSIS — Z3492 Encounter for supervision of normal pregnancy, unspecified, second trimester: Secondary | ICD-10-CM

## 2015-12-24 MED ORDER — ONDANSETRON 4 MG PO TBDP: 4.0000 mg | ORAL_TABLET | Freq: Four times a day (QID) | ORAL | Status: DC | PRN

## 2015-12-24 MED ORDER — PHENYLEPHRINE IN HARD FAT 0.25 % RE SUPP: 1.0000 | Freq: Two times a day (BID) | RECTAL | Status: DC

## 2015-12-24 NOTE — Progress Notes (Signed)
Pt worked in today for vomiting and diarrhea since 1 this morning. Pt states that she can't eat or drink anything.

## 2015-12-24 NOTE — Progress Notes (Signed)
Patient ID: Katherine Villegas, female   DOB: 08-02-1991, 25 y.o.   MRN: [id number] [facility] APPT FOR VOMITING AND DIARRHEA [id number] [redacted]w[redacted]d Estimated Date of Delivery: 05/23/16  Blood pressure 90/56, pulse 112, weight 183 lb (83.008 kg), last menstrual period 08/17/2015.   refer to the ob flow sheet for FH and FHR, also BP, Wt, Urine results:notable for not done  Patient reports  + good fetal movement, denies any bleeding and no rupture of membranes symptoms or regular contractions. Patient complaints: Patient WORKED IN today for 6-7 episodes of diarrhea, as well as constant nausea and vomiting, in the past 12 hours. She states she is not able to keep down liquids. Patient also notes she has had constipation for the past week. Patient states this is new for her, as she has not had morning sickness throughout this pregnancy. She denies any known contact with anyone with similar GI symptoms, although she states her daughter had influenza (URI) for the past week. Patient herself has had a constant cough for the past 1.5 weeks.   Questions were answered. Assessment: LROB [id number] @ [redacted]w[redacted]d , gastroenteritis  Plan:  Continued routine obstetrical care,   Rx phenylephrine suppositories and Zofran dissolving tablet.  , as well as constant hydration.    F/u as scheduled for pnx care  By signing my name below, I, Ronney Lion, attest that this documentation has been prepared under the direction and in the presence of Tilda Burrow, MD. Electronically Signed: Ronney Lion, ED Scribe. 12/24/2015. 12:18 PM.  I personally performed the services described in this documentation, which was SCRIBED in my presence. The recorded information has been reviewed and considered accurate. It has been edited as necessary during review. Tilda Burrow, MD

## 2015-12-25 ENCOUNTER — Ambulatory Visit (INDEPENDENT_AMBULATORY_CARE_PROVIDER_SITE_OTHER): Payer: BLUE CROSS/BLUE SHIELD | Admitting: *Deleted

## 2015-12-25 ENCOUNTER — Encounter: Payer: Self-pay | Admitting: *Deleted

## 2015-12-25 VITALS — BP 120/70 | HR 88 | Ht 62.0 in | Wt 183.0 lb

## 2015-12-25 DIAGNOSIS — Z1389 Encounter for screening for other disorder: Secondary | ICD-10-CM

## 2015-12-25 DIAGNOSIS — O09212 Supervision of pregnancy with history of pre-term labor, second trimester: Secondary | ICD-10-CM

## 2015-12-25 DIAGNOSIS — O09892 Supervision of other high risk pregnancies, second trimester: Secondary | ICD-10-CM

## 2015-12-25 DIAGNOSIS — Z3492 Encounter for supervision of normal pregnancy, unspecified, second trimester: Secondary | ICD-10-CM

## 2015-12-25 DIAGNOSIS — Z331 Pregnant state, incidental: Secondary | ICD-10-CM

## 2015-12-25 LAB — POCT URINALYSIS DIPSTICK
GLUCOSE UA: NEGATIVE
Ketones, UA: NEGATIVE
LEUKOCYTES UA: NEGATIVE
NITRITE UA: NEGATIVE

## 2015-12-25 MED ORDER — HYDROXYPROGESTERONE CAPROATE 250 MG/ML IM OIL
250.0000 mg | TOPICAL_OIL | Freq: Once | INTRAMUSCULAR | Status: AC
  Administered 2015-12-25: 250 mg via INTRAMUSCULAR

## 2015-12-25 NOTE — Progress Notes (Signed)
Pt here for 17P. Pt tolerated shot well. Return in 1 week for next shot. JSY 

## 2016-01-01 ENCOUNTER — Other Ambulatory Visit: Payer: BLUE CROSS/BLUE SHIELD

## 2016-01-01 ENCOUNTER — Ambulatory Visit (INDEPENDENT_AMBULATORY_CARE_PROVIDER_SITE_OTHER): Payer: BLUE CROSS/BLUE SHIELD | Admitting: Advanced Practice Midwife

## 2016-01-01 ENCOUNTER — Ambulatory Visit (INDEPENDENT_AMBULATORY_CARE_PROVIDER_SITE_OTHER): Payer: BLUE CROSS/BLUE SHIELD

## 2016-01-01 VITALS — BP 100/72 | HR 80 | Wt 187.0 lb

## 2016-01-01 DIAGNOSIS — Z363 Encounter for antenatal screening for malformations: Secondary | ICD-10-CM

## 2016-01-01 DIAGNOSIS — Z1389 Encounter for screening for other disorder: Secondary | ICD-10-CM

## 2016-01-01 DIAGNOSIS — Z36 Encounter for antenatal screening of mother: Secondary | ICD-10-CM

## 2016-01-01 DIAGNOSIS — O09212 Supervision of pregnancy with history of pre-term labor, second trimester: Secondary | ICD-10-CM | POA: Diagnosis not present

## 2016-01-01 DIAGNOSIS — Z3A20 20 weeks gestation of pregnancy: Secondary | ICD-10-CM

## 2016-01-01 DIAGNOSIS — Z331 Pregnant state, incidental: Secondary | ICD-10-CM

## 2016-01-01 DIAGNOSIS — Z3492 Encounter for supervision of normal pregnancy, unspecified, second trimester: Secondary | ICD-10-CM

## 2016-01-01 DIAGNOSIS — Z8751 Personal history of pre-term labor: Secondary | ICD-10-CM

## 2016-01-01 LAB — POCT URINALYSIS DIPSTICK
Glucose, UA: NEGATIVE
KETONES UA: NEGATIVE
Nitrite, UA: NEGATIVE
PROTEIN UA: NEGATIVE

## 2016-01-01 MED ORDER — HYDROXYPROGESTERONE CAPROATE 250 MG/ML IM OIL
250.0000 mg | TOPICAL_OIL | Freq: Once | INTRAMUSCULAR | Status: AC
  Administered 2016-01-01: 250 mg via INTRAMUSCULAR

## 2016-01-01 NOTE — Progress Notes (Signed)
[id number] [redacted]w[redacted]d Estimated Date of Delivery: 05/23/16  Blood pressure 100/72, pulse 80, weight 187 lb (84.823 kg), last menstrual period 08/17/2015.   BP weight and urine results all reviewed and noted.  Please refer to the obstetrical flow sheet for the fundal height and fetal heart rate documentation:Anatomy US today at 19+[redacted] weeks GA. Single, active, (parents do not wish to know gender of fetus today!!!) female fetus in a cephalic presentation. Cervix is closed and measures 3.6 cm. Fluid is normal with SVP 5.8 cm. Bilateral ovaries not visualized; however, no adnexal abnormalities noted. Posterior Gr 1 placenta. FHR 153 bpm. All anatomy visualized and appears normal today. EFW 345 g is consistent with dating.  Patient reports good fetal movement, denies any bleeding and no rupture of membranes symptoms or regular contractions. Patient is without complaints. All questions were answered.  Orders Placed This Encounter  Procedures  . POCT urinalysis dipstick    Plan:  Continued routine obstetrical care,   Return in about 4 weeks (around 01/29/2016) for 17P Weekly, LROB.

## 2016-01-01 NOTE — Progress Notes (Signed)
Anatomy US today at 19+[redacted] weeks GA. Single, active, (parents do not wish to know gender of fetus today!!!) female fetus in a cephalic presentation. Cervix is closed and measures 3.6 cm.  Fluid is normal with SVP 5.8 cm. Bilateral ovaries not visualized; however, no adnexal abnormalities noted. Posterior Gr 1 placenta. FHR 153 bpm. All anatomy visualized and appears normal today. EFW 345 g is consistent with dating.

## 2016-01-08 ENCOUNTER — Ambulatory Visit (INDEPENDENT_AMBULATORY_CARE_PROVIDER_SITE_OTHER): Payer: BLUE CROSS/BLUE SHIELD | Admitting: *Deleted

## 2016-01-08 ENCOUNTER — Encounter: Payer: Self-pay | Admitting: *Deleted

## 2016-01-08 VITALS — BP 110/60 | HR 86 | Ht 62.0 in | Wt 185.0 lb

## 2016-01-08 DIAGNOSIS — Z1389 Encounter for screening for other disorder: Secondary | ICD-10-CM

## 2016-01-08 DIAGNOSIS — Z331 Pregnant state, incidental: Secondary | ICD-10-CM

## 2016-01-08 DIAGNOSIS — Z3492 Encounter for supervision of normal pregnancy, unspecified, second trimester: Secondary | ICD-10-CM

## 2016-01-08 DIAGNOSIS — O09892 Supervision of other high risk pregnancies, second trimester: Secondary | ICD-10-CM

## 2016-01-08 DIAGNOSIS — O09212 Supervision of pregnancy with history of pre-term labor, second trimester: Secondary | ICD-10-CM

## 2016-01-08 LAB — POCT URINALYSIS DIPSTICK
GLUCOSE UA: NEGATIVE
KETONES UA: NEGATIVE
Leukocytes, UA: NEGATIVE
Nitrite, UA: NEGATIVE
PROTEIN UA: NEGATIVE

## 2016-01-08 MED ORDER — HYDROXYPROGESTERONE CAPROATE 250 MG/ML IM OIL
250.0000 mg | TOPICAL_OIL | Freq: Once | INTRAMUSCULAR | Status: AC
  Administered 2016-01-08: 250 mg via INTRAMUSCULAR

## 2016-01-08 NOTE — Progress Notes (Signed)
Pt here for 17P. Pt tolerated shot well. Return in 1 week for next shot. JSY 

## 2016-01-15 ENCOUNTER — Ambulatory Visit (INDEPENDENT_AMBULATORY_CARE_PROVIDER_SITE_OTHER): Payer: BLUE CROSS/BLUE SHIELD | Admitting: *Deleted

## 2016-01-15 ENCOUNTER — Encounter: Payer: Self-pay | Admitting: *Deleted

## 2016-01-15 VITALS — BP 100/60 | HR 92 | Ht 62.0 in | Wt 185.5 lb

## 2016-01-15 DIAGNOSIS — O09892 Supervision of other high risk pregnancies, second trimester: Secondary | ICD-10-CM

## 2016-01-15 DIAGNOSIS — Z1389 Encounter for screening for other disorder: Secondary | ICD-10-CM | POA: Diagnosis not present

## 2016-01-15 DIAGNOSIS — O09212 Supervision of pregnancy with history of pre-term labor, second trimester: Secondary | ICD-10-CM

## 2016-01-15 DIAGNOSIS — Z331 Pregnant state, incidental: Secondary | ICD-10-CM

## 2016-01-15 DIAGNOSIS — Z3492 Encounter for supervision of normal pregnancy, unspecified, second trimester: Secondary | ICD-10-CM

## 2016-01-15 LAB — POCT URINALYSIS DIPSTICK
Glucose, UA: NEGATIVE
KETONES UA: NEGATIVE
Nitrite, UA: NEGATIVE

## 2016-01-15 MED ORDER — HYDROXYPROGESTERONE CAPROATE 250 MG/ML IM OIL
250.0000 mg | TOPICAL_OIL | Freq: Once | INTRAMUSCULAR | Status: AC
  Administered 2016-01-15: 250 mg via INTRAMUSCULAR

## 2016-01-15 NOTE — Progress Notes (Signed)
Pt here for 17P. Pt tolerated shot well. Return in 1 week for next shot. JSY 

## 2016-01-22 ENCOUNTER — Encounter: Payer: Self-pay | Admitting: *Deleted

## 2016-01-22 ENCOUNTER — Ambulatory Visit (INDEPENDENT_AMBULATORY_CARE_PROVIDER_SITE_OTHER): Payer: BLUE CROSS/BLUE SHIELD | Admitting: *Deleted

## 2016-01-22 VITALS — BP 110/60 | HR 82 | Ht 62.0 in | Wt 188.0 lb

## 2016-01-22 DIAGNOSIS — Z3492 Encounter for supervision of normal pregnancy, unspecified, second trimester: Secondary | ICD-10-CM

## 2016-01-22 DIAGNOSIS — Z331 Pregnant state, incidental: Secondary | ICD-10-CM

## 2016-01-22 DIAGNOSIS — Z1389 Encounter for screening for other disorder: Secondary | ICD-10-CM

## 2016-01-22 DIAGNOSIS — O09212 Supervision of pregnancy with history of pre-term labor, second trimester: Secondary | ICD-10-CM | POA: Diagnosis not present

## 2016-01-22 DIAGNOSIS — O09892 Supervision of other high risk pregnancies, second trimester: Secondary | ICD-10-CM

## 2016-01-22 LAB — POCT URINALYSIS DIPSTICK
GLUCOSE UA: NEGATIVE
Ketones, UA: NEGATIVE
LEUKOCYTES UA: NEGATIVE
NITRITE UA: NEGATIVE
Protein, UA: NEGATIVE

## 2016-01-22 MED ORDER — HYDROXYPROGESTERONE CAPROATE 250 MG/ML IM OIL
250.0000 mg | TOPICAL_OIL | Freq: Once | INTRAMUSCULAR | Status: AC
  Administered 2016-01-22: 250 mg via INTRAMUSCULAR

## 2016-01-22 NOTE — Progress Notes (Signed)
Pt here for 17P. Pt tolerated shot well. Return in 1 week for next shot. JSY 

## 2016-01-29 ENCOUNTER — Ambulatory Visit (INDEPENDENT_AMBULATORY_CARE_PROVIDER_SITE_OTHER): Payer: BLUE CROSS/BLUE SHIELD | Admitting: Advanced Practice Midwife

## 2016-01-29 VITALS — BP 108/76 | HR 96 | Wt 191.0 lb

## 2016-01-29 DIAGNOSIS — Z3492 Encounter for supervision of normal pregnancy, unspecified, second trimester: Secondary | ICD-10-CM

## 2016-01-29 DIAGNOSIS — O09212 Supervision of pregnancy with history of pre-term labor, second trimester: Secondary | ICD-10-CM

## 2016-01-29 DIAGNOSIS — Z331 Pregnant state, incidental: Secondary | ICD-10-CM

## 2016-01-29 DIAGNOSIS — O09892 Supervision of other high risk pregnancies, second trimester: Secondary | ICD-10-CM

## 2016-01-29 DIAGNOSIS — Z1389 Encounter for screening for other disorder: Secondary | ICD-10-CM

## 2016-01-29 LAB — POCT URINALYSIS DIPSTICK
Blood, UA: NEGATIVE
GLUCOSE UA: NEGATIVE
KETONES UA: NEGATIVE
LEUKOCYTES UA: NEGATIVE
Nitrite, UA: NEGATIVE
PROTEIN UA: NEGATIVE

## 2016-01-29 MED ORDER — HYDROXYPROGESTERONE CAPROATE 250 MG/ML IM OIL
250.0000 mg | TOPICAL_OIL | Freq: Once | INTRAMUSCULAR | Status: AC
  Administered 2016-01-29: 250 mg via INTRAMUSCULAR

## 2016-01-29 NOTE — Patient Instructions (Signed)

## 2016-01-29 NOTE — Progress Notes (Signed)
[id number] 9344w4d Estimated Date of Delivery: 05/23/16  Last menstrual period 08/17/2015.   BP weight and urine results all reviewed and noted.  Please refer to the obstetrical flow sheet for the fundal height and fetal heart rate documentation:  Patient reports good fetal movement, denies any bleeding and no rupture of membranes symptoms or regular contractions. Patient is without complaints. All questions were answered.  No orders of the defined types were placed in this encounter.    Plan:  Continued routine obstetrical care, 17p today  Return in about 4 weeks (around 02/26/2016) for PN2/LROB and , 17P Weekly.

## 2016-02-05 ENCOUNTER — Ambulatory Visit (INDEPENDENT_AMBULATORY_CARE_PROVIDER_SITE_OTHER): Payer: BLUE CROSS/BLUE SHIELD | Admitting: *Deleted

## 2016-02-05 VITALS — BP 102/68 | HR 82 | Wt 192.0 lb

## 2016-02-05 DIAGNOSIS — Z8751 Personal history of pre-term labor: Secondary | ICD-10-CM

## 2016-02-05 DIAGNOSIS — O09212 Supervision of pregnancy with history of pre-term labor, second trimester: Secondary | ICD-10-CM | POA: Diagnosis not present

## 2016-02-05 DIAGNOSIS — Z1389 Encounter for screening for other disorder: Secondary | ICD-10-CM

## 2016-02-05 DIAGNOSIS — Z331 Pregnant state, incidental: Secondary | ICD-10-CM

## 2016-02-05 LAB — POCT URINALYSIS DIPSTICK
Blood, UA: NEGATIVE
Glucose, UA: NEGATIVE
KETONES UA: NEGATIVE
Leukocytes, UA: NEGATIVE
Nitrite, UA: NEGATIVE

## 2016-02-05 MED ORDER — HYDROXYPROGESTERONE CAPROATE 250 MG/ML IM OIL
250.0000 mg | TOPICAL_OIL | Freq: Once | INTRAMUSCULAR | Status: AC
  Administered 2016-02-05: 250 mg via INTRAMUSCULAR

## 2016-02-05 NOTE — Progress Notes (Signed)
Patient ID: Katherine NoonKatelynn Heinz, female   DOB: 04/04/1991, 25 y.o.   MRN: [id number] Hydroxyprogesterone Caproate (17 P) injection right deltoid given with no complications. Pt states she has no complaints at this time. Pt has her next appt scheduled for next 17 P injection.

## 2016-02-09 ENCOUNTER — Telehealth: Payer: Self-pay | Admitting: *Deleted

## 2016-02-09 NOTE — Telephone Encounter (Signed)
Pt c/o radiating pain that extends from right side to center of back, +FM, no vaginal bleeding, no gush of fluids. Pt informed to push water, take Tylenol, if no improvement call our office back. Pt verbalized understanding.

## 2016-02-12 ENCOUNTER — Ambulatory Visit (INDEPENDENT_AMBULATORY_CARE_PROVIDER_SITE_OTHER): Payer: BLUE CROSS/BLUE SHIELD | Admitting: *Deleted

## 2016-02-12 ENCOUNTER — Encounter: Payer: Self-pay | Admitting: *Deleted

## 2016-02-12 VITALS — BP 98/70 | HR 74 | Ht 62.0 in | Wt 196.0 lb

## 2016-02-12 DIAGNOSIS — Z1389 Encounter for screening for other disorder: Secondary | ICD-10-CM

## 2016-02-12 DIAGNOSIS — O09212 Supervision of pregnancy with history of pre-term labor, second trimester: Secondary | ICD-10-CM

## 2016-02-12 DIAGNOSIS — Z331 Pregnant state, incidental: Secondary | ICD-10-CM

## 2016-02-12 DIAGNOSIS — Z3492 Encounter for supervision of normal pregnancy, unspecified, second trimester: Secondary | ICD-10-CM

## 2016-02-12 DIAGNOSIS — O09892 Supervision of other high risk pregnancies, second trimester: Secondary | ICD-10-CM

## 2016-02-12 LAB — POCT URINALYSIS DIPSTICK
Blood, UA: NEGATIVE
GLUCOSE UA: NEGATIVE
Ketones, UA: NEGATIVE
LEUKOCYTES UA: NEGATIVE
NITRITE UA: NEGATIVE
Protein, UA: NEGATIVE

## 2016-02-12 MED ORDER — HYDROXYPROGESTERONE CAPROATE 250 MG/ML IM OIL
250.0000 mg | TOPICAL_OIL | Freq: Once | INTRAMUSCULAR | Status: AC
  Administered 2016-02-12: 250 mg via INTRAMUSCULAR

## 2016-02-12 NOTE — Progress Notes (Signed)
Pt here for 17P. Pt tolerated shot well. Return in 1 week for next shot. JSY 

## 2016-02-19 ENCOUNTER — Encounter: Payer: Self-pay | Admitting: *Deleted

## 2016-02-19 ENCOUNTER — Ambulatory Visit (INDEPENDENT_AMBULATORY_CARE_PROVIDER_SITE_OTHER): Payer: BLUE CROSS/BLUE SHIELD | Admitting: *Deleted

## 2016-02-19 VITALS — BP 92/60 | HR 90 | Ht 62.0 in | Wt 197.5 lb

## 2016-02-19 DIAGNOSIS — Z1389 Encounter for screening for other disorder: Secondary | ICD-10-CM

## 2016-02-19 DIAGNOSIS — Z331 Pregnant state, incidental: Secondary | ICD-10-CM

## 2016-02-19 DIAGNOSIS — O09892 Supervision of other high risk pregnancies, second trimester: Secondary | ICD-10-CM

## 2016-02-19 DIAGNOSIS — O09212 Supervision of pregnancy with history of pre-term labor, second trimester: Secondary | ICD-10-CM

## 2016-02-19 DIAGNOSIS — Z3492 Encounter for supervision of normal pregnancy, unspecified, second trimester: Secondary | ICD-10-CM

## 2016-02-19 LAB — POCT URINALYSIS DIPSTICK
Glucose, UA: NEGATIVE
KETONES UA: NEGATIVE
NITRITE UA: NEGATIVE
PROTEIN UA: NEGATIVE

## 2016-02-19 MED ORDER — HYDROXYPROGESTERONE CAPROATE 250 MG/ML IM OIL
250.0000 mg | TOPICAL_OIL | Freq: Once | INTRAMUSCULAR | Status: AC
  Administered 2016-02-19: 250 mg via INTRAMUSCULAR

## 2016-02-19 NOTE — Progress Notes (Signed)
Pt here for 17P. Pt tolerated shot well. Return in 1 week for next shot. JSY 

## 2016-02-23 ENCOUNTER — Ambulatory Visit (INDEPENDENT_AMBULATORY_CARE_PROVIDER_SITE_OTHER): Payer: BLUE CROSS/BLUE SHIELD | Admitting: Women's Health

## 2016-02-23 ENCOUNTER — Encounter: Payer: Self-pay | Admitting: Women's Health

## 2016-02-23 VITALS — BP 108/60 | HR 104 | Wt 196.0 lb

## 2016-02-23 DIAGNOSIS — L259 Unspecified contact dermatitis, unspecified cause: Secondary | ICD-10-CM

## 2016-02-23 DIAGNOSIS — Z331 Pregnant state, incidental: Secondary | ICD-10-CM

## 2016-02-23 DIAGNOSIS — H7391 Unspecified disorder of tympanic membrane, right ear: Secondary | ICD-10-CM

## 2016-02-23 DIAGNOSIS — H6692 Otitis media, unspecified, left ear: Secondary | ICD-10-CM

## 2016-02-23 DIAGNOSIS — Z1389 Encounter for screening for other disorder: Secondary | ICD-10-CM

## 2016-02-23 DIAGNOSIS — Z3A28 28 weeks gestation of pregnancy: Secondary | ICD-10-CM

## 2016-02-23 DIAGNOSIS — Z3492 Encounter for supervision of normal pregnancy, unspecified, second trimester: Secondary | ICD-10-CM

## 2016-02-23 LAB — POCT URINALYSIS DIPSTICK
Blood, UA: NEGATIVE
GLUCOSE UA: NEGATIVE
KETONES UA: NEGATIVE
Leukocytes, UA: NEGATIVE
Nitrite, UA: NEGATIVE

## 2016-02-23 MED ORDER — AMOXICILLIN-POT CLAVULANATE 250-62.5 MG/5ML PO SUSR: 875.0000 mg | Freq: Two times a day (BID) | ORAL | Status: DC

## 2016-02-23 MED ORDER — PREDNISONE 20 MG PO TABS: 40.0000 mg | ORAL_TABLET | Freq: Every day | ORAL | Status: DC

## 2016-02-23 NOTE — Progress Notes (Signed)
   [facility] Visit  Patient name: Katherine Villegas MRN [id number]  Date of birth: 07-08-1991  CC & HPI:  Katherine Villegas is a 25 y.o. [id number] Caucasian female at 2674w1d presenting today as a Work-In for report of cough/congestion/Lt ear pain since Sat, denies fever/chills, cough non-productive. 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 Also reports itchy red rash started on Lt arm, spread to Rt- getting worse, hydrocortisone cream not helping.   Patient's last menstrual period was 08/17/2015 (exact date).  Pertinent History Reviewed:  Medical & Surgical Hx:   Past medical, surgical, family, and social history reviewed in electronic medical record Medications: Reviewed & Updated - see associated section Allergies: Reviewed in electronic medical record  Objective Findings:  Vitals: BP 108/60 mmHg  Pulse 104  Wt 196 lb (88.905 kg)  LMP 08/17/2015 (Exact Date) Body mass index is 35.84 kg/(m^2).  Physical Examination: General appearance - alert, well appearing, and in no distress Ears - NO TM on Rt ear, no redness or signs of infection, Large blue T-shaped tube Lt ear, erythema in canal: co-exam w/ LHE who agrees Chest - clear to auscultation, no wheezes, rales or rhonchi, symmetric air entry Heart - normal rate and regular rhythm Skin - fine scattered rash Rt arm, Large coalesced erythematous that extends whole length of Lt forearm- appears to be contact dermatitis  Results for orders placed or performed in visit on 02/23/16 (from the past 24 hour(s))  POCT urinalysis dipstick   Collection Time: 02/23/16 10:57 AM  Result Value Ref Range   Color, UA     Clarity, UA     Glucose, UA neg    Bilirubin, UA     Ketones, UA neg    Spec Grav, UA     Blood, UA neg    pH, UA     Protein, UA trace    Urobilinogen, UA     Nitrite, UA neg    Leukocytes, UA Negative Negative      Assessment & Plan:  A:   5274w1d   Lt ear infection, NO TM Rt ear  URI  Contact dermatitis bilateral forearms  P:  Per LHE- rx augmentin for ears- pt unable to take large pills requests liquid, 875mg  BID x 7d  Per LHE- rx prednisone 40mg  daily x 10d for contact dermatitis non-responsive to topical steroids  Call ENT to get appt for No Rt TM  Return for As scheduled. on Thurs for pn2 and visit  Marge DuncansBooker, Kimberly Randall CNM, [facility] 02/23/2016 11:35 AM

## 2016-02-23 NOTE — Patient Instructions (Addendum)
Call ENT to get appointment [phone]  You will have your sugar test next visit.  Please do not eat or drink anything after midnight the night before you come, not even water.  You will be here for at least two hours.      Humidifier and saline nasal spray for nasal congestion  Regular robitussin, cough drops for cough  Warm salt water gargles for sore throat  Mucinex with lots of water to help you cough up the mucous in your chest if needed  Drink plenty of fluids and stay hydrated!  Wash your hands frequently.  Call if you are not improving by 7-10 days.    Safe Medications in Pregnancy   Acne: Benzoyl Peroxide Salicylic Acid  Backache/Headache: Tylenol: 2 regular strength every 4 hours OR              2 Extra strength every 6 hours  Colds/Coughs/Allergies: Benadryl (alcohol free) 25 mg every 6 hours as needed Breath right strips Claritin Cepacol throat lozenges Chloraseptic throat spray Cold-Eeze- up to three times per day Cough drops, alcohol free Flonase (by prescription only) Guaifenesin Mucinex Robitussin DM (plain only, alcohol free) Saline nasal spray/drops Sudafed (pseudoephedrine) & Actifed ** use only after [redacted] weeks gestation and if you do not have high blood pressure Tylenol Vicks Vaporub Zinc lozenges Zyrtec   Constipation: Colace Ducolax suppositories Fleet enema Glycerin suppositories Metamucil Milk of magnesia Miralax Senokot Smooth move tea  Diarrhea: Kaopectate Imodium A-D  *NO pepto Bismol  Hemorrhoids: Anusol Anusol HC Preparation H Tucks  Indigestion: Tums Maalox Mylanta Zantac  Pepcid  Insomnia: Benadryl (alcohol free) 25mg  every 6 hours as needed Tylenol PM Unisom, no Gelcaps  Leg Cramps: Tums MagGel  Nausea/Vomiting:  Bonine Dramamine Emetrol Ginger extract Sea bands Meclizine  Nausea medication to take during pregnancy:  Unisom (doxylamine succinate 25 mg tablets) Take one tablet daily at  bedtime. If symptoms are not adequately controlled, the dose can be increased to a maximum recommended dose of two tablets daily (1/2 tablet in the morning, 1/2 tablet mid-afternoon and one at bedtime). Vitamin B6 100mg  tablets. Take one tablet twice a day (up to 200 mg per day).  Skin Rashes: Aveeno products Benadryl cream or 25mg  every 6 hours as needed Calamine Lotion 1% Hydrocortisone cream  Yeast infection: Gyne-lotrimin 7 Monistat 7   **If taking multiple medications, please check labels to avoid duplicating the same active ingredients **take medication as directed on the label ** Do not exceed 4000 mg of tylenol in 24 hours **Do not take medications that contain aspirin or ibuprofen

## 2016-02-26 ENCOUNTER — Other Ambulatory Visit: Payer: BLUE CROSS/BLUE SHIELD

## 2016-02-26 ENCOUNTER — Ambulatory Visit (INDEPENDENT_AMBULATORY_CARE_PROVIDER_SITE_OTHER): Payer: BLUE CROSS/BLUE SHIELD | Admitting: Advanced Practice Midwife

## 2016-02-26 VITALS — BP 110/82 | HR 90 | Wt 198.5 lb

## 2016-02-26 DIAGNOSIS — Z369 Encounter for antenatal screening, unspecified: Secondary | ICD-10-CM

## 2016-02-26 DIAGNOSIS — Z331 Pregnant state, incidental: Secondary | ICD-10-CM

## 2016-02-26 DIAGNOSIS — Z1389 Encounter for screening for other disorder: Secondary | ICD-10-CM

## 2016-02-26 DIAGNOSIS — Z3492 Encounter for supervision of normal pregnancy, unspecified, second trimester: Secondary | ICD-10-CM

## 2016-02-26 DIAGNOSIS — Z131 Encounter for screening for diabetes mellitus: Secondary | ICD-10-CM

## 2016-02-26 DIAGNOSIS — Z8751 Personal history of pre-term labor: Secondary | ICD-10-CM

## 2016-02-26 DIAGNOSIS — O09212 Supervision of pregnancy with history of pre-term labor, second trimester: Secondary | ICD-10-CM | POA: Diagnosis not present

## 2016-02-26 LAB — POCT URINALYSIS DIPSTICK
Glucose, UA: NEGATIVE
Ketones, UA: NEGATIVE
Leukocytes, UA: NEGATIVE
NITRITE UA: NEGATIVE
RBC UA: NEGATIVE

## 2016-02-26 MED ORDER — HYDROXYPROGESTERONE CAPROATE 250 MG/ML IM OIL
250.0000 mg | TOPICAL_OIL | Freq: Once | INTRAMUSCULAR | Status: AC
  Administered 2016-02-26: 250 mg via INTRAMUSCULAR

## 2016-02-26 NOTE — Progress Notes (Signed)
[id number] 3168w4d Estimated Date of Delivery: 05/23/16  Blood pressure 110/82, pulse 90, weight 198 lb 8 oz (90.039 kg), last menstrual period 08/17/2015.   BP weight and urine results all reviewed and noted.  Please refer to the obstetrical flow sheet for the fundal height and fetal heart rate documentation:  Patient reports good fetal movement, denies any bleeding and no rupture of membranes symptoms or regular contractions. Patient is without complaints. 17p today.  Rash better.  All questions were answered.  Orders Placed This Encounter  Procedures  . POCT urinalysis dipstick    Plan:  Continued routine obstetrical care, TOLAC consent signed today  Return in about 3 weeks (around 03/18/2016) for LROB, 17P Weekly.

## 2016-02-26 NOTE — Patient Instructions (Signed)

## 2016-02-27 LAB — CBC
Hematocrit: 33.1 % — ABNORMAL LOW (ref 34.0–46.6)
Hemoglobin: 10.9 g/dL — ABNORMAL LOW (ref 11.1–15.9)
MCH: 28.9 pg (ref 26.6–33.0)
MCHC: 32.9 g/dL (ref 31.5–35.7)
MCV: 88 fL (ref 79–97)
PLATELETS: 206 10*3/uL (ref 150–379)
RBC: 3.77 x10E6/uL (ref 3.77–5.28)
RDW: 14.6 % (ref 12.3–15.4)
WBC: 14.5 10*3/uL — ABNORMAL HIGH (ref 3.4–10.8)

## 2016-02-27 LAB — GLUCOSE TOLERANCE, 2 HOURS W/ 1HR
GLUCOSE, 1 HOUR: 145 mg/dL (ref 65–179)
GLUCOSE, 2 HOUR: 142 mg/dL (ref 65–152)
Glucose, Fasting: 86 mg/dL (ref 65–91)

## 2016-02-27 LAB — HIV ANTIBODY (ROUTINE TESTING W REFLEX): HIV Screen 4th Generation wRfx: NONREACTIVE

## 2016-02-27 LAB — ANTIBODY SCREEN: Antibody Screen: NEGATIVE

## 2016-02-27 LAB — RPR: RPR: NONREACTIVE

## 2016-03-04 ENCOUNTER — Ambulatory Visit (INDEPENDENT_AMBULATORY_CARE_PROVIDER_SITE_OTHER): Payer: BLUE CROSS/BLUE SHIELD | Admitting: Advanced Practice Midwife

## 2016-03-04 ENCOUNTER — Ambulatory Visit: Payer: BLUE CROSS/BLUE SHIELD | Admitting: *Deleted

## 2016-03-04 VITALS — BP 100/60 | HR 84 | Ht 62.0 in | Wt 200.0 lb

## 2016-03-04 VITALS — BP 100/60 | HR 84 | Wt 200.0 lb

## 2016-03-04 DIAGNOSIS — O09213 Supervision of pregnancy with history of pre-term labor, third trimester: Principal | ICD-10-CM

## 2016-03-04 DIAGNOSIS — Z331 Pregnant state, incidental: Secondary | ICD-10-CM

## 2016-03-04 DIAGNOSIS — O09893 Supervision of other high risk pregnancies, third trimester: Secondary | ICD-10-CM

## 2016-03-04 DIAGNOSIS — Z3483 Encounter for supervision of other normal pregnancy, third trimester: Secondary | ICD-10-CM

## 2016-03-04 DIAGNOSIS — Z3A28 28 weeks gestation of pregnancy: Secondary | ICD-10-CM

## 2016-03-04 DIAGNOSIS — Z3493 Encounter for supervision of normal pregnancy, unspecified, third trimester: Secondary | ICD-10-CM

## 2016-03-04 DIAGNOSIS — Z1389 Encounter for screening for other disorder: Secondary | ICD-10-CM

## 2016-03-04 DIAGNOSIS — O3463 Maternal care for abnormality of vagina, third trimester: Secondary | ICD-10-CM

## 2016-03-04 LAB — POCT URINALYSIS DIPSTICK
Blood, UA: NEGATIVE
GLUCOSE UA: NEGATIVE
Ketones, UA: NEGATIVE
LEUKOCYTES UA: NEGATIVE
NITRITE UA: NEGATIVE
Protein, UA: NEGATIVE

## 2016-03-04 MED ORDER — HYDROXYPROGESTERONE CAPROATE 250 MG/ML IM OIL
250.0000 mg | TOPICAL_OIL | Freq: Once | INTRAMUSCULAR | Status: AC
  Administered 2016-03-04: 250 mg via INTRAMUSCULAR

## 2016-03-04 NOTE — Progress Notes (Signed)
Patient ID: Katherine NoonKatelynn Villegas, female   DOB: 04/04/1991, 25 y.o.   MRN: [id number] Pt here today for weekly 17P injection. Pt states that she had a quarter sized blood tinged mucus two days ago, since the mucus when she stands she feels like she is peeing on her self , pt states that she has to change her underwear 5-6 times a day due to this.

## 2016-03-04 NOTE — Progress Notes (Signed)
Pt worked in for pressure, and "feels like I pee every time I stand", has had a mucus discharge a couple days ago and fluid leakage since then.

## 2016-03-04 NOTE — Progress Notes (Signed)
WORK IN OB:  Pt states she "is peeing on herself every time she stands up", mucus dc for a few days/pressure.  Thinks she has a yeast infection because she sees white DC.  SSE:  No pooling, fern and valsalva negative.  Only a small amount of nornmal appearing white DC w/o odor.  Wet prep negative.  No evidence of ROM; limitaitons discussed, [facility] sx persist and are suspect for ROM.

## 2016-03-11 ENCOUNTER — Ambulatory Visit (INDEPENDENT_AMBULATORY_CARE_PROVIDER_SITE_OTHER): Payer: BLUE CROSS/BLUE SHIELD | Admitting: *Deleted

## 2016-03-11 ENCOUNTER — Encounter: Payer: Self-pay | Admitting: *Deleted

## 2016-03-11 VITALS — BP 110/68 | HR 98 | Ht 62.0 in | Wt 201.0 lb

## 2016-03-11 DIAGNOSIS — Z331 Pregnant state, incidental: Secondary | ICD-10-CM

## 2016-03-11 DIAGNOSIS — Z1389 Encounter for screening for other disorder: Secondary | ICD-10-CM

## 2016-03-11 DIAGNOSIS — O09893 Supervision of other high risk pregnancies, third trimester: Secondary | ICD-10-CM

## 2016-03-11 DIAGNOSIS — O09213 Supervision of pregnancy with history of pre-term labor, third trimester: Secondary | ICD-10-CM | POA: Diagnosis not present

## 2016-03-11 DIAGNOSIS — Z3493 Encounter for supervision of normal pregnancy, unspecified, third trimester: Secondary | ICD-10-CM

## 2016-03-11 LAB — POCT URINALYSIS DIPSTICK
GLUCOSE UA: NEGATIVE
Ketones, UA: NEGATIVE
Nitrite, UA: NEGATIVE
Protein, UA: NEGATIVE

## 2016-03-11 MED ORDER — HYDROXYPROGESTERONE CAPROATE 250 MG/ML IM OIL
250.0000 mg | TOPICAL_OIL | Freq: Once | INTRAMUSCULAR | Status: AC
  Administered 2016-03-11: 250 mg via INTRAMUSCULAR

## 2016-03-11 NOTE — Progress Notes (Signed)
Pt here for 17P. Pt tolerated shot well. Return in 1 week for next shot. JSY 

## 2016-03-18 ENCOUNTER — Ambulatory Visit (INDEPENDENT_AMBULATORY_CARE_PROVIDER_SITE_OTHER): Payer: BLUE CROSS/BLUE SHIELD | Admitting: Advanced Practice Midwife

## 2016-03-18 VITALS — BP 100/72 | HR 60 | Wt 203.0 lb

## 2016-03-18 DIAGNOSIS — Z3A31 31 weeks gestation of pregnancy: Secondary | ICD-10-CM

## 2016-03-18 DIAGNOSIS — Z3493 Encounter for supervision of normal pregnancy, unspecified, third trimester: Secondary | ICD-10-CM

## 2016-03-18 DIAGNOSIS — Z8751 Personal history of pre-term labor: Secondary | ICD-10-CM

## 2016-03-18 DIAGNOSIS — Z1389 Encounter for screening for other disorder: Secondary | ICD-10-CM

## 2016-03-18 DIAGNOSIS — O09213 Supervision of pregnancy with history of pre-term labor, third trimester: Secondary | ICD-10-CM | POA: Diagnosis not present

## 2016-03-18 DIAGNOSIS — Z331 Pregnant state, incidental: Secondary | ICD-10-CM

## 2016-03-18 DIAGNOSIS — O2393 Unspecified genitourinary tract infection in pregnancy, third trimester: Secondary | ICD-10-CM

## 2016-03-18 DIAGNOSIS — Z3483 Encounter for supervision of other normal pregnancy, third trimester: Secondary | ICD-10-CM

## 2016-03-18 LAB — POCT URINALYSIS DIPSTICK
GLUCOSE UA: NEGATIVE
Ketones, UA: NEGATIVE
Leukocytes, UA: NEGATIVE
Nitrite, UA: NEGATIVE
PROTEIN UA: NEGATIVE
RBC UA: NEGATIVE

## 2016-03-18 MED ORDER — HYDROXYPROGESTERONE CAPROATE 250 MG/ML IM OIL
250.0000 mg | TOPICAL_OIL | Freq: Once | INTRAMUSCULAR | Status: AC
  Administered 2016-03-18: 250 mg via INTRAMUSCULAR

## 2016-03-18 NOTE — Progress Notes (Signed)
[id number] 4637w4d Estimated Date of Delivery: 05/23/16  Blood pressure 100/72, pulse 60, weight 203 lb (92.08 kg), last menstrual period 08/17/2015.   BP weight and urine results all reviewed and noted.  Please refer to the obstetrical flow sheet for the fundal height and fetal heart rate documentation:  Patient reports good fetal movement, denies any bleeding and no rupture of membranes symptoms or regular contractions. Patient has had an itchy yellow discharge for 1 week.  SSE:  Dc c/w yeast All questions were answered.  Orders Placed This Encounter  Procedures  . POCT urinalysis dipstick    Plan:  Continued routine obstetrical care, 7 day OTC yeast tx  Return for 17P Weekly and 2 weeks LROB.

## 2016-03-25 ENCOUNTER — Encounter: Payer: Self-pay | Admitting: *Deleted

## 2016-03-25 ENCOUNTER — Ambulatory Visit (INDEPENDENT_AMBULATORY_CARE_PROVIDER_SITE_OTHER): Payer: BLUE CROSS/BLUE SHIELD | Admitting: *Deleted

## 2016-03-25 VITALS — BP 102/62 | HR 90 | Ht 62.0 in | Wt 206.0 lb

## 2016-03-25 DIAGNOSIS — Z1389 Encounter for screening for other disorder: Secondary | ICD-10-CM

## 2016-03-25 DIAGNOSIS — O09213 Supervision of pregnancy with history of pre-term labor, third trimester: Secondary | ICD-10-CM | POA: Diagnosis not present

## 2016-03-25 DIAGNOSIS — O09893 Supervision of other high risk pregnancies, third trimester: Secondary | ICD-10-CM

## 2016-03-25 DIAGNOSIS — Z3493 Encounter for supervision of normal pregnancy, unspecified, third trimester: Secondary | ICD-10-CM

## 2016-03-25 DIAGNOSIS — Z331 Pregnant state, incidental: Secondary | ICD-10-CM

## 2016-03-25 LAB — POCT URINALYSIS DIPSTICK
Blood, UA: NEGATIVE
GLUCOSE UA: NEGATIVE
Ketones, UA: NEGATIVE
LEUKOCYTES UA: NEGATIVE
Nitrite, UA: NEGATIVE
Protein, UA: NEGATIVE

## 2016-03-25 MED ORDER — HYDROXYPROGESTERONE CAPROATE 250 MG/ML IM OIL
250.0000 mg | TOPICAL_OIL | Freq: Once | INTRAMUSCULAR | Status: AC
  Administered 2016-03-25: 250 mg via INTRAMUSCULAR

## 2016-03-25 NOTE — Progress Notes (Signed)
Pt here for 17P. Pt tolerated shot well. Return in 1 week for next shot. JSY 

## 2016-03-26 ENCOUNTER — Inpatient Hospital Stay (HOSPITAL_COMMUNITY)
Admission: AD | Admit: 2016-03-26 | Discharge: 2016-03-26 | Disposition: A | Discharge status: Home / Self Care | Payer: BLUE CROSS/BLUE SHIELD | Source: Ambulatory Visit | Attending: Obstetrics and Gynecology | Admitting: Obstetrics and Gynecology

## 2016-03-26 ENCOUNTER — Encounter (HOSPITAL_COMMUNITY): Payer: Self-pay | Admitting: *Deleted

## 2016-03-26 DIAGNOSIS — Z3A31 31 weeks gestation of pregnancy: Secondary | ICD-10-CM | POA: Diagnosis not present

## 2016-03-26 DIAGNOSIS — O99333 Smoking (tobacco) complicating pregnancy, third trimester: Secondary | ICD-10-CM | POA: Insufficient documentation

## 2016-03-26 DIAGNOSIS — O26893 Other specified pregnancy related conditions, third trimester: Secondary | ICD-10-CM | POA: Insufficient documentation

## 2016-03-26 DIAGNOSIS — Z8249 Family history of ischemic heart disease and other diseases of the circulatory system: Secondary | ICD-10-CM | POA: Insufficient documentation

## 2016-03-26 DIAGNOSIS — O26899 Other specified pregnancy related conditions, unspecified trimester: Secondary | ICD-10-CM

## 2016-03-26 DIAGNOSIS — O2693 Pregnancy related conditions, unspecified, third trimester: Secondary | ICD-10-CM | POA: Diagnosis not present

## 2016-03-26 DIAGNOSIS — F17210 Nicotine dependence, cigarettes, uncomplicated: Secondary | ICD-10-CM | POA: Insufficient documentation

## 2016-03-26 DIAGNOSIS — Z91040 Latex allergy status: Secondary | ICD-10-CM | POA: Diagnosis not present

## 2016-03-26 DIAGNOSIS — M545 Low back pain, unspecified: Secondary | ICD-10-CM

## 2016-03-26 DIAGNOSIS — R51 Headache: Secondary | ICD-10-CM | POA: Insufficient documentation

## 2016-03-26 DIAGNOSIS — M7989 Other specified soft tissue disorders: Secondary | ICD-10-CM | POA: Diagnosis present

## 2016-03-26 DIAGNOSIS — O1203 Gestational edema, third trimester: Secondary | ICD-10-CM | POA: Diagnosis not present

## 2016-03-26 DIAGNOSIS — R519 Headache, unspecified: Secondary | ICD-10-CM

## 2016-03-26 DIAGNOSIS — Z3493 Encounter for supervision of normal pregnancy, unspecified, third trimester: Secondary | ICD-10-CM

## 2016-03-26 LAB — URINALYSIS, ROUTINE W REFLEX MICROSCOPIC
BILIRUBIN URINE: NEGATIVE
GLUCOSE, UA: NEGATIVE mg/dL
Hgb urine dipstick: NEGATIVE
Ketones, ur: NEGATIVE mg/dL
NITRITE: NEGATIVE
PH: 7 (ref 5.0–8.0)
Protein, ur: NEGATIVE mg/dL
SPECIFIC GRAVITY, URINE: 1.02 (ref 1.005–1.030)

## 2016-03-26 LAB — URINE MICROSCOPIC-ADD ON

## 2016-03-26 NOTE — MAU Note (Signed)
Pt stated she has been having increased swelling in her left left and her "hands feel like sausages" Also c/o headache x 2-3 days.

## 2016-03-26 NOTE — Discharge Instructions (Signed)
We DO NOT think you have a deep vein thrombosis, but here is information regarding this problem so that you know the danger signs to watch for.   Deep Vein Thrombosis A deep vein thrombosis (DVT) is a blood clot (thrombus) that usually occurs in a deep, larger vein of the lower leg or the pelvis, or in an upper extremity such as the arm. These are dangerous and can lead to serious and even life-threatening complications if the clot travels to the lungs. A DVT can damage the valves in your leg veins so that instead of flowing upward, the blood pools in the lower leg. This is called post-thrombotic syndrome, and it can result in pain, swelling, discoloration, and sores on the leg. CAUSES A DVT is caused by the formation of a blood clot in your leg, pelvis, or arm. Usually, several things contribute to the formation of blood clots. A clot may develop when:  Your blood flow slows down.  Your vein becomes damaged in some way.  You have a condition that makes your blood clot more easily. RISK FACTORS A DVT is more likely to develop in:  People who are older, especially over 76 years of age.  People who are overweight (obese).  People who sit or lie still for a long time, such as during long-distance travel (over 4 hours), bed rest, hospitalization, or during recovery from certain medical conditions like a stroke.  People who do not engage in much physical activity (sedentary lifestyle).  People who have chronic breathing disorders.  People who have a personal or family history of blood clots or blood clotting disease.  People who have peripheral vascular disease (PVD), diabetes, or some types of cancer.  People who have heart disease, especially if the person had a recent heart attack or has congestive heart failure.  People who have neurological diseases that affect the legs (leg paresis).  People who have had a traumatic injury, such as breaking a hip or leg.  People who have  recently had major or lengthy surgery, especially on the hip, knee, or abdomen.  People who have had a central line placed inside a large vein.  People who take medicines that contain the hormone estrogen. These include birth control pills and hormone replacement therapy.  Pregnancy or during childbirth or the postpartum period.  Long plane flights (over 8 hours). SIGNS AND SYMPTOMS Symptoms of a DVT can include:   Swelling of your leg or arm, especially if one side is much worse.  Warmth and redness of your leg or arm, especially if one side is much worse.  Pain in your arm or leg. If the clot is in your leg, symptoms may be more noticeable or worse when you stand or walk.  A feeling of pins and needles, if the clot is in the arm. The symptoms of a DVT that has traveled to the lungs (pulmonary embolism, PE) usually start suddenly and include:  Shortness of breath while active or at rest.  Coughing or coughing up blood or blood-tinged mucus.  Chest pain that is often worse with deep breaths.  Rapid or irregular heartbeat.  Feeling light-headed or dizzy.  Fainting.  Feeling anxious.  Sweating. There may also be pain and swelling in a leg if that is where the blood clot started. These symptoms may represent a serious problem that is an emergency. Do not wait to see if the symptoms will go away. Get medical help right away. Call your local emergency services (  911 in the [place] Do not drive yourself to the hospital. DIAGNOSIS Your health care provider will take a medical history and perform a physical exam. You may also have other tests, including:  Blood tests to assess the clotting properties of your blood.  Imaging tests, such as CT, ultrasound, MRI, X-ray, and other tests to see if you have clots anywhere in your body. TREATMENT After a DVT is identified, it can be treated. The type of treatment that you receive depends on many factors, such as the cause of your DVT, your  risk for bleeding or developing more clots, and other medical conditions that you have. Sometimes, a combination of treatments is necessary. Treatment options may be combined and include:  Monitoring the blood clot with ultrasound.  Taking medicines by mouth, such as newer blood thinners (anticoagulants), thrombolytics, or warfarin.  Taking anticoagulant medicine by injection or through an IV tube.  Wearing compression stockings or using different types ofdevices.  Surgery (rare) to remove the blood clot or to place a filter in your abdomen to stop the blood clot from traveling to your lungs. Treatments for a DVT are often divided into immediate treatment and long-term treatment (up to 3 months after DVT). You can work with your health care provider to choose the treatment program that is best for you. HOME CARE INSTRUCTIONS If you are taking a newer oral anticoagulant:  Take the medicine every single day at the same time each day.  Understand what foods and drugs interact with this medicine.  Understand that there are no regular blood tests required when using this medicine.  Understand the side effects of this medicine, including excessive bruising or bleeding. Ask your health care provider or pharmacist about other possible side effects. If you are taking warfarin:  Understand how to take warfarin and know which foods can affect how warfarin works in Public relations account executive.  Understand that it is dangerous to take too much or too little warfarin. Too much warfarin increases the risk of bleeding. Too little warfarin continues to allow the risk for blood clots.  Follow your PT and INR blood testing schedule. The PT and INR results allow your health care provider to adjust your dose of warfarin. It is very important that you have your PT and INR tested as often as told by your health care provider.  Avoid major changes in your diet, or tell your health care provider before you change your diet.  Arrange a visit with a registered dietitian to answer your questions. Many foods, especially foods that are high in vitamin K, can interfere with warfarin and affect the PT and INR results. Eat a consistent amount of foods that are high in vitamin K, such as:  Spinach, kale, broccoli, cabbage, collard greens, turnip greens, Brussels sprouts, peas, cauliflower, seaweed, and parsley.  Beef liver and pork liver.  Green tea.  Soybean oil.  Tell your health care provider about any and all medicines, vitamins, and supplements that you take, including aspirin and other over-the-counter anti-inflammatory medicines. Be especially cautious with aspirin and anti-inflammatory medicines. Do not take those before you ask your health care provider if it is safe to do so. This is important because many medicines can interfere with warfarin and affect the PT and INR results.  Do not start or stop taking any over-the-counter or prescription medicine unless your health care provider or pharmacist tells you to do so. If you take warfarin, you will also need to do these things:  Hold pressure over cuts for longer than usual.  Tell your dentist and other health care providers that you are taking warfarin before you have any procedures in which bleeding may occur.  Avoid alcohol or drink very small amounts. Tell your health care provider if you change your alcohol intake.  Do not use tobacco products, including cigarettes, chewing tobacco, and e-cigarettes. If you need help quitting, ask your health care provider.  Avoid contact sports. General Instructions  Take over-the-counter and prescription medicines only as told by your health care provider. Anticoagulant medicines can have side effects, including easy bruising and difficulty stopping bleeding. If you are prescribed an anticoagulant, you will also need to do these things:  Hold pressure over cuts for longer than usual.  Tell your dentist and other  health care providers that you are taking anticoagulants before you have any procedures in which bleeding may occur.  Avoid contact sports.  Wear a medical alert bracelet or carry a medical alert card that says you have had a PE.  Ask your health care provider how soon you can go back to your normal activities. Stay active to prevent new blood clots from forming.  Make sure to exercise while traveling or when you have been sitting or standing for a long period of time. It is very important to exercise. Exercise your legs by walking or by tightening and relaxing your leg muscles often. Take frequent walks.  Wear compression stockings as told by your health care provider to help prevent more blood clots from forming.  Do not use tobacco products, including cigarettes, chewing tobacco, and e-cigarettes. If you need help quitting, ask your health care provider.  Keep all follow-up appointments with your health care provider. This is important. PREVENTION Take these actions to decrease your risk of developing another DVT:  Exercise regularly. For at least 30 minutes every day, engage in:  Activity that involves moving your arms and legs.  Activity that encourages good blood flow through your body by increasing your heart rate.  Exercise your arms and legs every hour during long-distance travel (over 4 hours). Drink plenty of water and avoid drinking alcohol while traveling.  Avoid sitting or lying in bed for long periods of time without moving your legs.  Maintain a weight that is appropriate for your height. Ask your health care provider what weight is healthy for you.  If you are a woman who is over 25 years of age, avoid unnecessary use of medicines that contain estrogen. These include birth control pills.  Do not smoke, especially if you take estrogen medicines. If you need help quitting, ask your health care provider. If you are hospitalized, prevention measures may include:  Early  walking after surgery, as soon as your health care provider says that it is safe.  Receiving anticoagulants to prevent blood clots.If you cannot take anticoagulants, other options may be available, such as wearing compression stockings or using different types of devices. SEEK IMMEDIATE MEDICAL CARE IF:  You have new or increased pain, swelling, or redness in an arm or leg.  You have numbness or tingling in an arm or leg.  You have shortness of breath while active or at rest.  You have chest pain.  You have a rapid or irregular heartbeat.  You feel light-headed or dizzy.  You cough up blood.  You notice blood in your vomit, bowel movement, or urine. These symptoms may represent a serious problem that is an emergency. Do not wait to  see if the symptoms will go away. Get medical help right away. Call your local emergency services (911 in the [place] Do not drive yourself to the hospital.   This information is not intended to replace advice given to you by your health care provider. Make sure you discuss any questions you have with your health care provider.   Document Released: 10/25/2005 Document Revised: 07/16/2015 Document Reviewed: 02/19/2015 Elsevier Interactive Patient Education [employer]

## 2016-03-26 NOTE — MAU Provider Note (Signed)
MAU HISTORY AND PHYSICAL  Chief Complaint:  Leg Swelling and Headache   Katherine Villegas is a 25 y.o.  [phone] with IUP at 544w5d presenting for Leg Swelling and Headache  Headache: present 2-3 days. Hx migraine. Hasn't eased up with tylenol, which normally does. Whole head aches. No weakness or numbness. Has been drinking lots of fluids.  Also having low back and low pelvic ache. Feels urge to poop, poops sometimes. No dysuria or hematuria. No mid or upper back pain. No pain that comes and goes. No n/v. No fever.   Ankles are both swollen. Thinks left leg is more swollen than normal. Calf is painful. No personal or family history blood clot. No new difficulty breathing, no pain with breathing, no hemoptysis.     Past Medical History  Diagnosis Date  . Headache     Past Surgical History  Procedure Laterality Date  . Cesarean section    . External ear surgery    . Tonsillectomy      Family History  Problem Relation Age of Onset  . Migraines Mother   . Hypertension Mother   . Hyperlipidemia Mother   . Alcohol abuse Mother   . Diabetes Maternal Grandfather   . Alcohol abuse Father   . Arthritis Maternal Grandmother   . COPD Paternal Grandmother     Social History  Substance Use Topics  . Smoking status: Current Some Day Smoker -- 0.00 packs/day for 14 years    Types: Cigarettes  . Smokeless tobacco: Never Used     Comment: smokes 3 cig when she smokes  . Alcohol Use: No     Comment: rare    Allergies  Allergen Reactions  . Amoxicillin Other (See Comments)    Patient states it doesn't work due to overuse.  . Latex Itching and Rash    Prescriptions prior to admission  Medication Sig Dispense Refill Last Dose  . acetaminophen (TYLENOL) 500 MG tablet Take 1,000 mg by mouth every 6 (six) hours as needed. pain   Taking  . hydroxyprogesterone caproate (MAKENA) 250 mg/mL OIL injection Inject 250 mg into the muscle once a week.   Taking  . Prenatal Vit-Fe Fumarate-FA  (PRENATAL MULTIVITAMIN) TABS tablet Take 1 tablet by mouth daily at 12 Villegas.   Taking    Review of Systems - Negative except for what is mentioned in HPI.  Physical Exam  Blood pressure 110/63, pulse 89, temperature 98.5 F (36.9 C), resp. rate 18, last menstrual period 08/17/2015. GENERAL: Well-developed, well-nourished female in no acute distress.  LUNGS: Clear to auscultation bilaterally.  HEART: Regular rate and rhythm. ABDOMEN: Soft, nontender, nondistended, gravid. No cva tenderness EXTREMITIES: Nontender, no edema, 2+ distal pulses. Cervical Exam: closed/thick/high Neuro: cn2-12 grossly intact, 5/5 upper and lower strength, normal speech FHT:  130/mod/+a/-d Contractions: irritability   Labs: Results for orders placed or performed during the hospital encounter of 03/26/16 (from the past 24 hour(s))  Urinalysis, Routine w reflex microscopic ([facility])   Collection Time: 03/26/16  6:00 PM  Result Value Ref Range   Color, Urine YELLOW YELLOW   APPearance CLOUDY (A) CLEAR   Specific Gravity, Urine 1.020 1.005 - 1.030   pH 7.0 5.0 - 8.0   Glucose, UA NEGATIVE NEGATIVE mg/dL   Hgb urine dipstick NEGATIVE NEGATIVE   Bilirubin Urine NEGATIVE NEGATIVE   Ketones, ur NEGATIVE NEGATIVE mg/dL   Protein, ur NEGATIVE NEGATIVE mg/dL   Nitrite NEGATIVE NEGATIVE   Leukocytes, UA TRACE (A) NEGATIVE  Urine  microscopic-add on   Collection Time: 03/26/16  6:00 PM  Result Value Ref Range   Squamous Epithelial / LPF 0-5 (A) NONE SEEN   WBC, UA 0-5 0 - 5 WBC/hpf   RBC / HPF 0-5 0 - 5 RBC/hpf   Bacteria, UA RARE (A) NONE SEEN    Imaging Studies:  No results found.  Assessment/Plan: Katherine Villegas is  25 y.o. [phone] at [redacted]w[redacted]d presents with various complaints.  # HA: usual headache, but not going away. No neurologic symptoms, normal neuro exam. Pain is mild/mod - push fluids, increase tylenol to 3g/day, try adding caffeinated beverage  # Low back pain - no uti symptoms, ua not  suggestive of infection, no neurologic symptoms. Likely normal low back pain of pregnancy. Uterine irritability, but cervix closed and thick. NST reactive - belly band, heat, warm baths, activity   # Leg swelling: no swelling on exam, mild left calf tenderness but no redness or palpable cord, no respiratory symptoms. Discussed w/ Dr. Emelda Fear, will defer dvt w/u for now but gave strict dvt/pe return precautions   Cherrie Gauze [facility] 5/19/20176:52 PM

## 2016-04-01 ENCOUNTER — Ambulatory Visit (INDEPENDENT_AMBULATORY_CARE_PROVIDER_SITE_OTHER): Payer: BLUE CROSS/BLUE SHIELD | Admitting: Obstetrics and Gynecology

## 2016-04-01 ENCOUNTER — Encounter: Payer: Self-pay | Admitting: Obstetrics and Gynecology

## 2016-04-01 VITALS — BP 110/60 | HR 94 | Wt 207.5 lb

## 2016-04-01 DIAGNOSIS — Z3A35 35 weeks gestation of pregnancy: Secondary | ICD-10-CM

## 2016-04-01 DIAGNOSIS — O09213 Supervision of pregnancy with history of pre-term labor, third trimester: Secondary | ICD-10-CM

## 2016-04-01 DIAGNOSIS — Z1389 Encounter for screening for other disorder: Secondary | ICD-10-CM

## 2016-04-01 DIAGNOSIS — O09893 Supervision of other high risk pregnancies, third trimester: Secondary | ICD-10-CM

## 2016-04-01 DIAGNOSIS — Z331 Pregnant state, incidental: Secondary | ICD-10-CM

## 2016-04-01 DIAGNOSIS — Z3483 Encounter for supervision of other normal pregnancy, third trimester: Secondary | ICD-10-CM

## 2016-04-01 DIAGNOSIS — Z3493 Encounter for supervision of normal pregnancy, unspecified, third trimester: Secondary | ICD-10-CM

## 2016-04-01 MED ORDER — HYDROXYPROGESTERONE CAPROATE 250 MG/ML IM OIL
250.0000 mg | TOPICAL_OIL | Freq: Once | INTRAMUSCULAR | Status: AC
  Administered 2016-04-01: 250 mg via INTRAMUSCULAR

## 2016-04-01 NOTE — Progress Notes (Signed)
Patient ID: Katherine Villegas, female   DOB: 01-May-1991, 25 y.o.   MRN: [id number]   High Risk Pregnancy Diagnosis(es):   H/o c/s @34wks  d/t PPROM/breech  [id number] 2064w4d Estimated Date of Delivery: 05/23/16    HPI: The patient is being seen today for ongoing management of the above .  Today she reports bilateral pitting ankle swelling. Pt reports her previous child delivered at 34 weeks required hospitalization for 2 weeks. She reports she plans on delivering vaginally and un-medicated if possible.   Patient reports good fetal movement, denies any bleeding and no rupture of membranes symptoms or regular contractions.   BP weight and urine results reviewed and noted. Blood pressure 110/60, pulse 94, weight 207 lb 8 oz (94.121 kg), last menstrual period 08/17/2015.  Fetal Surveillance Testing today:  none Fundal Height:  34 cm Fetal Heart rate:  147 bpm  Edema:  none Urinalysis: Not done   Questions were answered.  Lab and sonogram results have been reviewed. Comments: not done   Assessment:  1.  Pregnancy at 1364w4d,  Estimated Date of Delivery: 05/23/16 :  [id number]                         2.  H/o c/s @34wks  d/t PPROM/breech                        3. Weekly 17P  Medication(s) Plans:  Continue weekly 17P   Treatment Plan:  Continue HROB care, 17P weekly, fetal US at 36 weeks to check fetal position for any doubt of presentation  Follow up in 2 weeks for appointment for high risk OB care, 1 week for 17P    By signing my name below, I, Doreatha MartinEva Mathews, attest that this documentation has been prepared under the direction and in the presence of Tilda BurrowJohn Ruth Kovich V, MD. Electronically Signed: Doreatha MartinEva Mathews, ED Scribe. 04/01/2016. 10:03 AM.  I personally performed the services described in this documentation, which was SCRIBED in my presence. The recorded information has been reviewed and considered accurate. It has been edited as necessary during review. Tilda BurrowFERGUSON,Masyn Rostro V, MD

## 2016-04-01 NOTE — Progress Notes (Signed)
Pt denies any problems or concerns at this time.  

## 2016-04-02 ENCOUNTER — Telehealth: Payer: Self-pay | Admitting: Obstetrics and Gynecology

## 2016-04-02 ENCOUNTER — Telehealth: Payer: Self-pay | Admitting: *Deleted

## 2016-04-02 ENCOUNTER — Other Ambulatory Visit: Payer: Self-pay | Admitting: Obstetrics and Gynecology

## 2016-04-02 MED ORDER — HYDROCODONE-HOMATROPINE 5-1.5 MG/5ML PO SYRP: 5.0000 mL | ORAL_SOLUTION | Freq: Four times a day (QID) | ORAL | Status: DC | PRN

## 2016-04-02 MED ORDER — GUAIFENESIN 200 MG PO TABS: 200.0000 mg | ORAL_TABLET | ORAL | Status: DC | PRN

## 2016-04-02 NOTE — Telephone Encounter (Signed)
Cough x 3 days,  Worse last night , cannot sleep + nasal congestion Will Rx hycodan, and Mucinex.Pt aware and will be by to pick up Rx.

## 2016-04-02 NOTE — Telephone Encounter (Signed)
Spoke with pt's husband. Pt has a cough x 3 days. Pt has tried Robitussin with no help. Hurts to breathe and talk today. Got worse overnight. Morrie SheldonAshley, LPN advised she would talk to Dr. Emelda FearFerguson when he came in. Pt was seen yesterday. JSY

## 2016-04-08 ENCOUNTER — Encounter: Payer: Self-pay | Admitting: *Deleted

## 2016-04-08 ENCOUNTER — Ambulatory Visit (INDEPENDENT_AMBULATORY_CARE_PROVIDER_SITE_OTHER): Payer: BLUE CROSS/BLUE SHIELD | Admitting: *Deleted

## 2016-04-08 VITALS — BP 110/66 | HR 99 | Ht 62.0 in | Wt 207.0 lb

## 2016-04-08 DIAGNOSIS — O09213 Supervision of pregnancy with history of pre-term labor, third trimester: Secondary | ICD-10-CM | POA: Diagnosis not present

## 2016-04-08 DIAGNOSIS — O09893 Supervision of other high risk pregnancies, third trimester: Secondary | ICD-10-CM

## 2016-04-08 DIAGNOSIS — Z1389 Encounter for screening for other disorder: Secondary | ICD-10-CM

## 2016-04-08 DIAGNOSIS — Z331 Pregnant state, incidental: Secondary | ICD-10-CM

## 2016-04-08 LAB — POCT URINALYSIS DIPSTICK
Blood, UA: NEGATIVE
Glucose, UA: NEGATIVE
Ketones, UA: NEGATIVE
Leukocytes, UA: NEGATIVE
Nitrite, UA: NEGATIVE
PROTEIN UA: NEGATIVE

## 2016-04-08 MED ORDER — HYDROXYPROGESTERONE CAPROATE 250 MG/ML IM OIL
250.0000 mg | TOPICAL_OIL | Freq: Once | INTRAMUSCULAR | Status: AC
  Administered 2016-04-08: 250 mg via INTRAMUSCULAR

## 2016-04-08 NOTE — Progress Notes (Signed)
Patient ID: Katherine Villegas, female   DOB: May 06, 1991, 25 y.o.   MRN: [id number] Pt here today for weekly 17P injection. Pt given 17P and tolerated well.

## 2016-04-15 ENCOUNTER — Ambulatory Visit (INDEPENDENT_AMBULATORY_CARE_PROVIDER_SITE_OTHER): Payer: BLUE CROSS/BLUE SHIELD | Admitting: Obstetrics & Gynecology

## 2016-04-15 ENCOUNTER — Encounter: Payer: Self-pay | Admitting: Obstetrics & Gynecology

## 2016-04-15 VITALS — BP 110/80 | HR 80 | Wt 206.0 lb

## 2016-04-15 DIAGNOSIS — Z1389 Encounter for screening for other disorder: Secondary | ICD-10-CM

## 2016-04-15 DIAGNOSIS — Z3483 Encounter for supervision of other normal pregnancy, third trimester: Secondary | ICD-10-CM

## 2016-04-15 DIAGNOSIS — O09213 Supervision of pregnancy with history of pre-term labor, third trimester: Secondary | ICD-10-CM

## 2016-04-15 DIAGNOSIS — Z3A35 35 weeks gestation of pregnancy: Secondary | ICD-10-CM

## 2016-04-15 DIAGNOSIS — Z3493 Encounter for supervision of normal pregnancy, unspecified, third trimester: Secondary | ICD-10-CM

## 2016-04-15 DIAGNOSIS — Z331 Pregnant state, incidental: Secondary | ICD-10-CM

## 2016-04-15 DIAGNOSIS — O09293 Supervision of pregnancy with other poor reproductive or obstetric history, third trimester: Secondary | ICD-10-CM

## 2016-04-15 LAB — POCT URINALYSIS DIPSTICK
Blood, UA: NEGATIVE
Glucose, UA: NEGATIVE
KETONES UA: NEGATIVE
Leukocytes, UA: NEGATIVE
Nitrite, UA: NEGATIVE

## 2016-04-15 MED ORDER — HYDROXYPROGESTERONE CAPROATE 250 MG/ML IM OIL
250.0000 mg | TOPICAL_OIL | Freq: Once | INTRAMUSCULAR | Status: AC
  Administered 2016-04-15: 250 mg via INTRAMUSCULAR

## 2016-04-15 NOTE — Addendum Note (Signed)
Addended by: Richardson ChiquitoRAVIS, Delcenia Inman M on: 04/15/2016 10:35 AM   Modules accepted: Orders

## 2016-04-15 NOTE — Progress Notes (Signed)
[id number] 2230w4d Estimated Date of Delivery: 05/23/16  Blood pressure 110/80, pulse 80, weight 206 lb (93.441 kg), last menstrual period 08/17/2015.   BP weight and urine results all reviewed and noted.  Please refer to the obstetrical flow sheet for the fundal height and fetal heart rate documentation:  Patient reports good fetal movement, denies any bleeding and no rupture of membranes symptoms or regular contractions. Patient is without complaints. All questions were answered.  Orders Placed This Encounter  Procedures  . POCT urinalysis dipstick    Plan:  Continued routine obstetrical care, 17P today  No Follow-up on file.

## 2016-04-22 ENCOUNTER — Encounter: Payer: Self-pay | Admitting: *Deleted

## 2016-04-22 ENCOUNTER — Ambulatory Visit (INDEPENDENT_AMBULATORY_CARE_PROVIDER_SITE_OTHER): Payer: BLUE CROSS/BLUE SHIELD | Admitting: *Deleted

## 2016-04-22 VITALS — BP 104/72 | HR 70 | Ht 62.0 in | Wt 210.0 lb

## 2016-04-22 DIAGNOSIS — O09213 Supervision of pregnancy with history of pre-term labor, third trimester: Secondary | ICD-10-CM

## 2016-04-22 DIAGNOSIS — Z1389 Encounter for screening for other disorder: Secondary | ICD-10-CM

## 2016-04-22 DIAGNOSIS — Z331 Pregnant state, incidental: Secondary | ICD-10-CM | POA: Diagnosis not present

## 2016-04-22 DIAGNOSIS — O09893 Supervision of other high risk pregnancies, third trimester: Secondary | ICD-10-CM

## 2016-04-22 DIAGNOSIS — Z3493 Encounter for supervision of normal pregnancy, unspecified, third trimester: Secondary | ICD-10-CM

## 2016-04-22 LAB — POCT URINALYSIS DIPSTICK
GLUCOSE UA: NEGATIVE
Glucose, UA: NEGATIVE
KETONES UA: NEGATIVE
LEUKOCYTES UA: NEGATIVE
NITRITE UA: NEGATIVE
PROTEIN UA: NEGATIVE

## 2016-04-22 MED ORDER — HYDROXYPROGESTERONE CAPROATE 250 MG/ML IM OIL
250.0000 mg | TOPICAL_OIL | Freq: Once | INTRAMUSCULAR | Status: AC
  Administered 2016-04-22: 250 mg via INTRAMUSCULAR

## 2016-04-22 NOTE — Progress Notes (Signed)
Pt here for 17P. Pt tolerated shot well. Return at scheduled appt. JSY

## 2016-04-26 ENCOUNTER — Inpatient Hospital Stay (HOSPITAL_COMMUNITY)
Admission: AD | Admit: 2016-04-26 | Discharge: 2016-04-27 | Disposition: A | Discharge status: Home / Self Care | Payer: BLUE CROSS/BLUE SHIELD | Source: Ambulatory Visit | Attending: Obstetrics & Gynecology | Admitting: Obstetrics & Gynecology

## 2016-04-26 DIAGNOSIS — Z91040 Latex allergy status: Secondary | ICD-10-CM | POA: Diagnosis not present

## 2016-04-26 DIAGNOSIS — Z3A36 36 weeks gestation of pregnancy: Secondary | ICD-10-CM | POA: Diagnosis not present

## 2016-04-26 DIAGNOSIS — O99333 Smoking (tobacco) complicating pregnancy, third trimester: Secondary | ICD-10-CM | POA: Insufficient documentation

## 2016-04-26 DIAGNOSIS — O4703 False labor before 37 completed weeks of gestation, third trimester: Secondary | ICD-10-CM | POA: Insufficient documentation

## 2016-04-26 DIAGNOSIS — O479 False labor, unspecified: Secondary | ICD-10-CM

## 2016-04-26 DIAGNOSIS — M549 Dorsalgia, unspecified: Secondary | ICD-10-CM | POA: Diagnosis present

## 2016-04-26 DIAGNOSIS — F17210 Nicotine dependence, cigarettes, uncomplicated: Secondary | ICD-10-CM | POA: Diagnosis not present

## 2016-04-26 DIAGNOSIS — Z8249 Family history of ischemic heart disease and other diseases of the circulatory system: Secondary | ICD-10-CM | POA: Insufficient documentation

## 2016-04-26 DIAGNOSIS — O26853 Spotting complicating pregnancy, third trimester: Secondary | ICD-10-CM

## 2016-04-27 ENCOUNTER — Encounter (HOSPITAL_COMMUNITY): Payer: Self-pay | Admitting: *Deleted

## 2016-04-27 DIAGNOSIS — Z3A36 36 weeks gestation of pregnancy: Secondary | ICD-10-CM | POA: Diagnosis not present

## 2016-04-27 DIAGNOSIS — O26853 Spotting complicating pregnancy, third trimester: Secondary | ICD-10-CM | POA: Diagnosis not present

## 2016-04-27 DIAGNOSIS — O4703 False labor before 37 completed weeks of gestation, third trimester: Secondary | ICD-10-CM | POA: Diagnosis not present

## 2016-04-27 LAB — URINALYSIS, ROUTINE W REFLEX MICROSCOPIC
Bilirubin Urine: NEGATIVE
GLUCOSE, UA: NEGATIVE mg/dL
Hgb urine dipstick: NEGATIVE
Ketones, ur: NEGATIVE mg/dL
Nitrite: NEGATIVE
PH: 6 (ref 5.0–8.0)
PROTEIN: NEGATIVE mg/dL

## 2016-04-27 LAB — URINE MICROSCOPIC-ADD ON

## 2016-04-27 NOTE — MAU Provider Note (Signed)
MAU HISTORY AND PHYSICAL  Chief Complaint:  Back Pain and Leg Pain   Katherine Villegas is a 25 y.o.  [phone] with IUP at [redacted]w[redacted]d presenting for Back Pain and Leg Pain Pt had a prior c-s at 34 weeks. Unsure of what to expect with pregnancy at this point, noted mucous-bloody on undergarment and upon wiping when she went to the bathroom.  Pt also reports lower back pain and intermittent "heaviness" and numbness in her legs that has progressed these past few weeks. Pt reports  none contractions, none vaginal bleeding, intact membranes, with active fetal movement.    Past Medical History  Diagnosis Date  . Headache     Past Surgical History  Procedure Laterality Date  . Cesarean section    . External ear surgery    . Tonsillectomy      Family History  Problem Relation Age of Onset  . Migraines Mother   . Hypertension Mother   . Hyperlipidemia Mother   . Alcohol abuse Mother   . Diabetes Maternal Grandfather   . Alcohol abuse Father   . Arthritis Maternal Grandmother   . COPD Paternal Grandmother     Social History  Substance Use Topics  . Smoking status: Current Every Day Smoker -- 0.25 packs/day for 14 years    Types: Cigarettes  . Smokeless tobacco: Never Used     Comment: smokes 3 cig when she smokes  . Alcohol Use: No     Comment: rare    Allergies  Allergen Reactions  . Amoxicillin Other (See Comments)    Patient states it doesn't work due to overuse.  . Latex Itching and Rash    Prescriptions prior to admission  Medication Sig Dispense Refill Last Dose  . Prenatal Vit-Fe Fumarate-FA (PRENATAL MULTIVITAMIN) TABS tablet Take 1 tablet by mouth daily at 12 noon.   04/27/2016 at Unknown time  . acetaminophen (TYLENOL) 500 MG tablet Take 1,000 mg by mouth every 6 (six) hours as needed. pain   Taking  . cephALEXin (KEFLEX) 250 MG capsule Take by mouth 3 (three) times daily.   Taking  . hydroxyprogesterone caproate (MAKENA) 250 mg/mL OIL injection Inject 250 mg into the  muscle once a week.   Taking    Review of Systems - Negative except for what is mentioned in HPI.  Physical Exam  Blood pressure 115/71, pulse 86, temperature 98.8 F (37.1 C), temperature source Oral, resp. rate 18, height  (1.575 m), weight 210 lb (95.255 kg), last menstrual period 08/17/2015. GENERAL: Well-developed, well-nourished female in no acute distress.  LUNGS: Clear to auscultation bilaterally.  HEART: Regular rate and rhythm. ABDOMEN: Soft, nontender, nondistended, gravid.  EXTREMITIES: Nontender, no edema, 2+ distal pulses. Cervical Exam: Closed/thick/posterior  Presentation: cephalic FHT:  Baseline 150, mod variability, +acels, no decels Contractions: None  Labs: Results for orders placed or performed during the hospital encounter of 04/26/16 (from the past 24 hour(s))  Urinalysis, Routine w reflex microscopic (not at [facility])   Collection Time: 04/26/16 11:51 PM  Result Value Ref Range   Color, Urine YELLOW YELLOW   APPearance CLEAR CLEAR   Specific Gravity, Urine <1.005 (L) 1.005 - 1.030   pH 6.0 5.0 - 8.0   Glucose, UA NEGATIVE NEGATIVE mg/dL   Hgb urine dipstick NEGATIVE NEGATIVE   Bilirubin Urine NEGATIVE NEGATIVE   Ketones, ur NEGATIVE NEGATIVE mg/dL   Protein, ur NEGATIVE NEGATIVE mg/dL   Nitrite NEGATIVE NEGATIVE   Leukocytes, UA SMALL (A) NEGATIVE  Urine microscopic-add on  Collection Time: 04/26/16 11:51 PM  Result Value Ref Range   Squamous Epithelial / LPF 0-5 (A) NONE SEEN   WBC, UA 0-5 0 - 5 WBC/hpf   RBC / HPF 0-5 0 - 5 RBC/hpf   Bacteria, UA RARE (A) NONE SEEN   Urine-Other AMORPHOUS URATES/PHOSPHATES     Imaging Studies:  No results found.  Assessment: Katherine Villegas is  25 y.o. [phone] at 1533w2d presents with Back Pain and Leg Pain .  Plan: #Bloody show #Braxton-Hicks -Cervical exam shows no change and NST reactive -Discussed labor precautions and to follow up with Ob provider on 6/22 as scheduled  Katherine Villegas 6/20/20171:17 AM   I performed the cervical exam and agree with above.  [place] Carlotta Villegas, CNM 04/27/2016 3:10 AM

## 2016-04-27 NOTE — Discharge Instructions (Signed)
Braxton Hicks Contractions °Contractions of the uterus can occur throughout pregnancy. Contractions are not always a sign that you are in labor.  °WHAT ARE BRAXTON HICKS CONTRACTIONS?  °Contractions that occur before labor are called Braxton Hicks contractions, or false labor. Toward the end of pregnancy (32-34 weeks), these contractions can develop more often and may become more forceful. This is not true labor because these contractions do not result in opening (dilatation) and thinning of the cervix. They are sometimes difficult to tell apart from true labor because these contractions can be forceful and people have different pain tolerances. You should not feel embarrassed if you go to the hospital with false labor. Sometimes, the only way to tell if you are in true labor is for your health care provider to look for changes in the cervix. °If there are no prenatal problems or other health problems associated with the pregnancy, it is completely safe to be sent home with false labor and await the onset of true labor. °HOW CAN YOU TELL THE DIFFERENCE BETWEEN TRUE AND FALSE LABOR? °False Labor °· The contractions of false labor are usually shorter and not as hard as those of true labor.   °· The contractions are usually irregular.   °· The contractions are often felt in the front of the lower abdomen and in the groin.   °· The contractions may go away when you walk around or change positions while lying down.   °· The contractions get weaker and are shorter lasting as time goes on.   °· The contractions do not usually become progressively stronger, regular, and closer together as with true labor.   °True Labor °· Contractions in true labor last 30-70 seconds, become very regular, usually become more intense, and increase in frequency.   °· The contractions do not go away with walking.   °· The discomfort is usually felt in the top of the uterus and spreads to the lower abdomen and low back.   °· True labor can be  determined by your health care provider with an exam. This will show that the cervix is dilating and getting thinner.   °WHAT TO REMEMBER °· Keep up with your usual exercises and follow other instructions given by your health care provider.   °· Take medicines as directed by your health care provider.   °· Keep your regular prenatal appointments.   °· Eat and drink lightly if you think you are going into labor.   °· If Braxton Hicks contractions are making you uncomfortable:   °¨ Change your position from lying down or resting to walking, or from walking to resting.   °¨ Sit and rest in a tub of warm water.   °¨ Drink 2-3 glasses of water. Dehydration may cause these contractions.   °¨ Do slow and deep breathing several times an hour.   °WHEN SHOULD I SEEK IMMEDIATE MEDICAL CARE? °Seek immediate medical care if: °· Your contractions become stronger, more regular, and closer together.   °· You have fluid leaking or gushing from your vagina.   °· You have a fever.   °· You pass blood-tinged mucus.   °· You have vaginal bleeding.   °· You have continuous abdominal pain.   °· You have low back pain that you never had before.   °· You feel your baby's head pushing down and causing pelvic pressure.   °· Your baby is not moving as much as it used to.   °  °This information is not intended to replace advice given to you by your health care provider. Make sure you discuss any questions you have with your health care   provider. °  °Document Released: 10/25/2005 Document Revised: 10/30/2013 Document Reviewed: 08/06/2013 °Elsevier Interactive Patient Education ©2016 [employer] ° °

## 2016-04-27 NOTE — MAU Note (Signed)
Back pain that runs down her legs, pelvic pressure, and some soft stools today since 8am this morning. Prior c-section @ 34 weeks. Some pink discharge. No clear fluid or bleeding

## 2016-04-29 ENCOUNTER — Ambulatory Visit (INDEPENDENT_AMBULATORY_CARE_PROVIDER_SITE_OTHER): Payer: BLUE CROSS/BLUE SHIELD | Admitting: Advanced Practice Midwife

## 2016-04-29 VITALS — BP 110/82 | HR 66 | Wt 215.0 lb

## 2016-04-29 DIAGNOSIS — O368130 Decreased fetal movements, third trimester, not applicable or unspecified: Secondary | ICD-10-CM

## 2016-04-29 DIAGNOSIS — Z3483 Encounter for supervision of other normal pregnancy, third trimester: Secondary | ICD-10-CM

## 2016-04-29 DIAGNOSIS — Z331 Pregnant state, incidental: Secondary | ICD-10-CM

## 2016-04-29 DIAGNOSIS — Z3685 Encounter for antenatal screening for Streptococcus B: Secondary | ICD-10-CM

## 2016-04-29 DIAGNOSIS — Z3A37 37 weeks gestation of pregnancy: Secondary | ICD-10-CM

## 2016-04-29 DIAGNOSIS — Z369 Encounter for antenatal screening, unspecified: Secondary | ICD-10-CM

## 2016-04-29 DIAGNOSIS — Z1389 Encounter for screening for other disorder: Secondary | ICD-10-CM

## 2016-04-29 DIAGNOSIS — Z3493 Encounter for supervision of normal pregnancy, unspecified, third trimester: Secondary | ICD-10-CM

## 2016-04-29 LAB — POCT URINALYSIS DIPSTICK
Glucose, UA: NEGATIVE
Ketones, UA: NEGATIVE
Leukocytes, UA: NEGATIVE
Nitrite, UA: NEGATIVE

## 2016-04-29 NOTE — Addendum Note (Signed)
Addended by: Criss AlvinePULLIAM, Gillie Fleites G on: 04/29/2016 03:23 PM   Modules accepted: Orders

## 2016-04-29 NOTE — Patient Instructions (Signed)

## 2016-04-29 NOTE — Progress Notes (Signed)
[id number] 4559w4d Estimated Date of Delivery: 05/23/16  Blood pressure 110/82, pulse 66, weight 215 lb (97.523 kg), last menstrual period 08/17/2015.   BP weight and urine results all reviewed and noted.  Please refer to the obstetrical flow sheet for the fundal height and fetal heart rate documentation:  Patient reports decreased fetal movement (pt can see movement on her belly that she doesn't feel ), denies any bleeding and no rupture of membranes symptoms or regular contractions. Patient is without complaints other than normal pregnancy complaints. All questions were answered.  Orders Placed This Encounter  Procedures  . POCT urinalysis dipstick    Plan:  Continued routine obstetrical care, NST reactive  Return in about 1 week (around 05/06/2016) for LROB.

## 2016-05-01 LAB — GC/CHLAMYDIA PROBE AMP
Chlamydia trachomatis, NAA: NEGATIVE
Neisseria gonorrhoeae by PCR: NEGATIVE

## 2016-05-01 LAB — STREP GP B NAA+RFLX: STREP GP B NAA+RFLX: NEGATIVE

## 2016-05-06 ENCOUNTER — Ambulatory Visit (INDEPENDENT_AMBULATORY_CARE_PROVIDER_SITE_OTHER): Payer: BLUE CROSS/BLUE SHIELD | Admitting: Obstetrics & Gynecology

## 2016-05-06 VITALS — BP 120/84 | HR 88 | Wt 211.0 lb

## 2016-05-06 DIAGNOSIS — Z3483 Encounter for supervision of other normal pregnancy, third trimester: Secondary | ICD-10-CM

## 2016-05-06 DIAGNOSIS — Z3A38 38 weeks gestation of pregnancy: Secondary | ICD-10-CM

## 2016-05-06 DIAGNOSIS — Z3493 Encounter for supervision of normal pregnancy, unspecified, third trimester: Secondary | ICD-10-CM

## 2016-05-06 DIAGNOSIS — Z1389 Encounter for screening for other disorder: Secondary | ICD-10-CM

## 2016-05-06 DIAGNOSIS — O34219 Maternal care for unspecified type scar from previous cesarean delivery: Secondary | ICD-10-CM

## 2016-05-06 DIAGNOSIS — Z331 Pregnant state, incidental: Secondary | ICD-10-CM

## 2016-05-06 LAB — POCT URINALYSIS DIPSTICK
GLUCOSE UA: NEGATIVE
KETONES UA: NEGATIVE
Leukocytes, UA: NEGATIVE
NITRITE UA: NEGATIVE
Protein, UA: NEGATIVE
RBC UA: NEGATIVE

## 2016-05-06 NOTE — Progress Notes (Signed)
[id number] 3541w4d Estimated Date of Delivery: 05/23/16  Blood pressure 120/84, pulse 88, weight 211 lb (95.709 kg), last menstrual period 08/17/2015.   BP weight and urine results all reviewed and noted.  Please refer to the obstetrical flow sheet for the fundal height and fetal heart rate documentation:  Patient reports good fetal movement, denies any bleeding and no rupture of membranes symptoms or regular contractions. Patient is without complaints. All questions were answered.  Orders Placed This Encounter  Procedures  . POCT urinalysis dipstick    Plan:  Continued routine obstetrical care, carrying low, hence drop in FH, no cervical changes as of yet  No Follow-up on file.

## 2016-05-14 ENCOUNTER — Ambulatory Visit (INDEPENDENT_AMBULATORY_CARE_PROVIDER_SITE_OTHER): Payer: BLUE CROSS/BLUE SHIELD | Admitting: Obstetrics & Gynecology

## 2016-05-14 ENCOUNTER — Encounter: Payer: Self-pay | Admitting: Obstetrics & Gynecology

## 2016-05-14 VITALS — BP 110/80 | HR 76 | Wt 214.0 lb

## 2016-05-14 DIAGNOSIS — Z3483 Encounter for supervision of other normal pregnancy, third trimester: Secondary | ICD-10-CM

## 2016-05-14 DIAGNOSIS — Z3A39 39 weeks gestation of pregnancy: Secondary | ICD-10-CM | POA: Diagnosis not present

## 2016-05-14 DIAGNOSIS — Z029 Encounter for administrative examinations, unspecified: Secondary | ICD-10-CM

## 2016-05-14 DIAGNOSIS — Z331 Pregnant state, incidental: Secondary | ICD-10-CM

## 2016-05-14 DIAGNOSIS — Z3493 Encounter for supervision of normal pregnancy, unspecified, third trimester: Secondary | ICD-10-CM

## 2016-05-14 DIAGNOSIS — Z1389 Encounter for screening for other disorder: Secondary | ICD-10-CM

## 2016-05-14 LAB — POCT URINALYSIS DIPSTICK
GLUCOSE UA: NEGATIVE
Ketones, UA: NEGATIVE
LEUKOCYTES UA: NEGATIVE
NITRITE UA: NEGATIVE
Protein, UA: NEGATIVE
RBC UA: NEGATIVE

## 2016-05-14 NOTE — Progress Notes (Signed)
[id number] 7757w5d Estimated Date of Delivery: 05/23/16  Blood pressure 110/80, pulse 76, weight 214 lb (97.07 kg), last menstrual period 08/17/2015.   BP weight and urine results all reviewed and noted.  Please refer to the obstetrical flow sheet for the fundal height and fetal heart rate documentation:  Patient reports good fetal movement, denies any bleeding and no rupture of membranes symptoms or regular contractions. Patient is without complaints. All questions were answered.  Orders Placed This Encounter  Procedures  . POCT urinalysis dipstick    Plan:  Continued routine obstetrical care,   No Follow-up on file.

## 2016-05-16 ENCOUNTER — Encounter (HOSPITAL_COMMUNITY): Payer: Self-pay

## 2016-05-16 ENCOUNTER — Inpatient Hospital Stay (HOSPITAL_COMMUNITY)
Admission: AD | Admit: 2016-05-16 | Discharge: 2016-05-16 | Disposition: A | Discharge status: Home / Self Care | Payer: BLUE CROSS/BLUE SHIELD | Source: Ambulatory Visit | Attending: Obstetrics and Gynecology | Admitting: Obstetrics and Gynecology

## 2016-05-16 DIAGNOSIS — Z3493 Encounter for supervision of normal pregnancy, unspecified, third trimester: Secondary | ICD-10-CM

## 2016-05-16 LAB — POCT FERN TEST: POCT Fern Test: NEGATIVE

## 2016-05-16 NOTE — Discharge Instructions (Signed)
Third Trimester of Pregnancy °The third trimester is from week 29 through week 42, months 7 through 9. The third trimester is a time when the fetus is growing rapidly. At the end of the ninth month, the fetus is about 20 inches in length and weighs 6-10 pounds.  °BODY CHANGES °Your body goes through many changes during pregnancy. The changes vary from woman to woman.  °· Your weight will continue to increase. You can expect to gain 25-35 pounds (11-16 kg) by the end of the pregnancy. °· You may begin to get stretch marks on your hips, abdomen, and breasts. °· You may urinate more often because the fetus is moving lower into your pelvis and pressing on your bladder. °· You may develop or continue to have heartburn as a result of your pregnancy. °· You may develop constipation because certain hormones are causing the muscles that push waste through your intestines to slow down. °· You may develop hemorrhoids or swollen, bulging veins (varicose veins). °· You may have pelvic pain because of the weight gain and pregnancy hormones relaxing your joints between the bones in your pelvis. Backaches may result from overexertion of the muscles supporting your posture. °· You may have changes in your hair. These can include thickening of your hair, rapid growth, and changes in texture. Some women also have hair loss during or after pregnancy, or hair that feels dry or thin. Your hair will most likely return to normal after your baby is born. °· Your breasts will continue to grow and be tender. A yellow discharge may leak from your breasts called colostrum. °· Your belly button may stick out. °· You may feel short of breath because of your expanding uterus. °· You may notice the fetus "dropping," or moving lower in your abdomen. °· You may have a bloody mucus discharge. This usually occurs a few days to a week before labor begins. °· Your cervix becomes thin and soft (effaced) near your due date. °WHAT TO EXPECT AT YOUR PRENATAL  EXAMS  °You will have prenatal exams every 2 weeks until week 36. Then, you will have weekly prenatal exams. During a routine prenatal visit: °· You will be weighed to make sure you and the fetus are growing normally. °· Your blood pressure is taken. °· Your abdomen will be measured to track your baby's growth. °· The fetal heartbeat will be listened to. °· Any test results from the previous visit will be discussed. °· You may have a cervical check near your due date to see if you have effaced. °At around 36 weeks, your caregiver will check your cervix. At the same time, your caregiver will also perform a test on the secretions of the vaginal tissue. This test is to determine if a type of bacteria, Group B streptococcus, is present. Your caregiver will explain this further. °Your caregiver may ask you: °· What your birth plan is. °· How you are feeling. °· If you are feeling the baby move. °· If you have had any abnormal symptoms, such as leaking fluid, bleeding, severe headaches, or abdominal cramping. °· If you are using any tobacco products, including cigarettes, chewing tobacco, and electronic cigarettes. °· If you have any questions. °Other tests or screenings that may be performed during your third trimester include: °· Blood tests that check for low iron levels (anemia). °· Fetal testing to check the health, activity level, and growth of the fetus. Testing is done if you have certain medical conditions or if   there are problems during the pregnancy. °· HIV (human immunodeficiency virus) testing. If you are at high risk, you may be screened for HIV during your third trimester of pregnancy. °FALSE LABOR °You may feel small, irregular contractions that eventually go away. These are called Braxton Hicks contractions, or false labor. Contractions may last for hours, days, or even weeks before true labor sets in. If contractions come at regular intervals, intensify, or become painful, it is best to be seen by your  caregiver.  °SIGNS OF LABOR  °· Menstrual-like cramps. °· Contractions that are 5 minutes apart or less. °· Contractions that start on the top of the uterus and spread down to the lower abdomen and back. °· A sense of increased pelvic pressure or back pain. °· A watery or bloody mucus discharge that comes from the vagina. °If you have any of these signs before the 37th week of pregnancy, call your caregiver right away. You need to go to the hospital to get checked immediately. °HOME CARE INSTRUCTIONS  °· Avoid all smoking, herbs, alcohol, and unprescribed drugs. These chemicals affect the formation and growth of the baby. °· Do not use any tobacco products, including cigarettes, chewing tobacco, and electronic cigarettes. If you need help quitting, ask your health care provider. You may receive counseling support and other resources to help you quit. °· Follow your caregiver's instructions regarding medicine use. There are medicines that are either safe or unsafe to take during pregnancy. °· Exercise only as directed by your caregiver. Experiencing uterine cramps is a good sign to stop exercising. °· Continue to eat regular, healthy meals. °· Wear a good support bra for breast tenderness. °· Do not use hot tubs, steam rooms, or saunas. °· Wear your seat belt at all times when driving. °· Avoid raw meat, uncooked cheese, cat litter boxes, and soil used by cats. These carry germs that can cause birth defects in the baby. °· Take your prenatal vitamins. °· Take 1500-2000 mg of calcium daily starting at the 20th week of pregnancy until you deliver your baby. °· Try taking a stool softener (if your caregiver approves) if you develop constipation. Eat more high-fiber foods, such as fresh vegetables or fruit and whole grains. Drink plenty of fluids to keep your urine clear or pale yellow. °· Take warm sitz baths to soothe any pain or discomfort caused by hemorrhoids. Use hemorrhoid cream if your caregiver approves. °· If  you develop varicose veins, wear support hose. Elevate your feet for 15 minutes, 3-4 times a day. Limit salt in your diet. °· Avoid heavy lifting, wear low heal shoes, and practice good posture. °· Rest a lot with your legs elevated if you have leg cramps or low back pain. °· Visit your dentist if you have not gone during your pregnancy. Use a soft toothbrush to brush your teeth and be gentle when you floss. °· A sexual relationship may be continued unless your caregiver directs you otherwise. °· Do not travel far distances unless it is absolutely necessary and only with the approval of your caregiver. °· Take prenatal classes to understand, practice, and ask questions about the labor and delivery. °· Make a trial run to the hospital. °· Pack your hospital bag. °· Prepare the baby's nursery. °· Continue to go to all your prenatal visits as directed by your caregiver. °SEEK MEDICAL CARE IF: °· You are unsure if you are in labor or if your water has broken. °· You have dizziness. °· You have   mild pelvic cramps, pelvic pressure, or nagging pain in your abdominal area. °· You have persistent nausea, vomiting, or diarrhea. °· You have a bad smelling vaginal discharge. °· You have pain with urination. °SEEK IMMEDIATE MEDICAL CARE IF:  °· You have a fever. °· You are leaking fluid from your vagina. °· You have spotting or bleeding from your vagina. °· You have severe abdominal cramping or pain. °· You have rapid weight loss or gain. °· You have shortness of breath with chest pain. °· You notice sudden or extreme swelling of your face, hands, ankles, feet, or legs. °· You have not felt your baby move in over an hour. °· You have severe headaches that do not go away with medicine. °· You have vision changes. °  °This information is not intended to replace advice given to you by your health care provider. Make sure you discuss any questions you have with your health care provider. °  °Document Released: 10/19/2001 Document  Revised: 11/15/2014 Document Reviewed: 12/26/2012 °[employer] Interactive Patient Education ©2016 [employer] °Fetal Movement Counts °Patient Name: __________________________________________________ Patient Due Date: ____________________ °Performing a fetal movement count is highly recommended in high-risk pregnancies, but it is good for every pregnant woman to do. Your health care provider may ask you to start counting fetal movements at 28 weeks of the pregnancy. Fetal movements often increase: °· After eating a full meal. °· After physical activity. °· After eating or drinking something sweet or cold. °· At rest. °Pay attention to when you feel the baby is most active. This will help you notice a pattern of your baby's sleep and wake cycles and what factors contribute to an increase in fetal movement. It is important to perform a fetal movement count at the same time each day when your baby is normally most active.  °HOW TO COUNT FETAL MOVEMENTS °· Find a quiet and comfortable area to sit or lie down on your left side. Lying on your left side provides the best blood and oxygen circulation to your baby. °· Write down the day and time on a sheet of paper or in a journal. °· Start counting kicks, flutters, swishes, rolls, or jabs in a 2-hour period. You should feel at least 10 movements within 2 hours. °· If you do not feel 10 movements in 2 hours, wait 2-3 hours and count again. Look for a change in the pattern or not enough counts in 2 hours. °SEEK MEDICAL CARE IF: °· You feel less than 10 counts in 2 hours, tried twice. °· There is no movement in over an hour. °· The pattern is changing or taking longer each day to reach 10 counts in 2 hours. °· You feel the baby is not moving as he or she usually does. °Date: ____________ Movements: ____________ Start time: ____________ Finish time: ____________  °Date: ____________ Movements: ____________ Start time: ____________ Finish time: ____________ °Date: ____________  Movements: ____________ Start time: ____________ Finish time: ____________ °Date: ____________ Movements: ____________ Start time: ____________ Finish time: ____________ °Date: ____________ Movements: ____________ Start time: ____________ Finish time: ____________ °Date: ____________ Movements: ____________ Start time: ____________ Finish time: ____________ °Date: ____________ Movements: ____________ Start time: ____________ Finish time: ____________ °Date: ____________ Movements: ____________ Start time: ____________ Finish time: ____________  °Date: ____________ Movements: ____________ Start time: ____________ Finish time: ____________ °Date: ____________ Movements: ____________ Start time: ____________ Finish time: ____________ °Date: ____________ Movements: ____________ Start time: ____________ Finish time: ____________ °Date: ____________ Movements: ____________ Start time: ____________ Finish time: ____________ °Date:   ____________ Movements: ____________ Start time: ____________ Finish time: ____________ °Date: ____________ Movements: ____________ Start time: ____________ Finish time: ____________ °Date: ____________ Movements: ____________ Start time: ____________ Finish time: ____________  °Date: ____________ Movements: ____________ Start time: ____________ Finish time: ____________ °Date: ____________ Movements: ____________ Start time: ____________ Finish time: ____________ °Date: ____________ Movements: ____________ Start time: ____________ Finish time: ____________ °Date: ____________ Movements: ____________ Start time: ____________ Finish time: ____________ °Date: ____________ Movements: ____________ Start time: ____________ Finish time: ____________ °Date: ____________ Movements: ____________ Start time: ____________ Finish time: ____________ °Date: ____________ Movements: ____________ Start time: ____________ Finish time: ____________  °Date: ____________ Movements: ____________ Start time:  ____________ Finish time: ____________ °Date: ____________ Movements: ____________ Start time: ____________ Finish time: ____________ °Date: ____________ Movements: ____________ Start time: ____________ Finish time: ____________ °Date: ____________ Movements: ____________ Start time: ____________ Finish time: ____________ °Date: ____________ Movements: ____________ Start time: ____________ Finish time: ____________ °Date: ____________ Movements: ____________ Start time: ____________ Finish time: ____________ °Date: ____________ Movements: ____________ Start time: ____________ Finish time: ____________  °Date: ____________ Movements: ____________ Start time: ____________ Finish time: ____________ °Date: ____________ Movements: ____________ Start time: ____________ Finish time: ____________ °Date: ____________ Movements: ____________ Start time: ____________ Finish time: ____________ °Date: ____________ Movements: ____________ Start time: ____________ Finish time: ____________ °Date: ____________ Movements: ____________ Start time: ____________ Finish time: ____________ °Date: ____________ Movements: ____________ Start time: ____________ Finish time: ____________ °Date: ____________ Movements: ____________ Start time: ____________ Finish time: ____________  °Date: ____________ Movements: ____________ Start time: ____________ Finish time: ____________ °Date: ____________ Movements: ____________ Start time: ____________ Finish time: ____________ °Date: ____________ Movements: ____________ Start time: ____________ Finish time: ____________ °Date: ____________ Movements: ____________ Start time: ____________ Finish time: ____________ °Date: ____________ Movements: ____________ Start time: ____________ Finish time: ____________ °Date: ____________ Movements: ____________ Start time: ____________ Finish time: ____________ °Date: ____________ Movements: ____________ Start time: ____________ Finish time: ____________  °Date:  ____________ Movements: ____________ Start time: ____________ Finish time: ____________ °Date: ____________ Movements: ____________ Start time: ____________ Finish time: ____________ °Date: ____________ Movements: ____________ Start time: ____________ Finish time: ____________ °Date: ____________ Movements: ____________ Start time: ____________ Finish time: ____________ °Date: ____________ Movements: ____________ Start time: ____________ Finish time: ____________ °Date: ____________ Movements: ____________ Start time: ____________ Finish time: ____________ °Date: ____________ Movements: ____________ Start time: ____________ Finish time: ____________  °Date: ____________ Movements: ____________ Start time: ____________ Finish time: ____________ °Date: ____________ Movements: ____________ Start time: ____________ Finish time: ____________ °Date: ____________ Movements: ____________ Start time: ____________ Finish time: ____________ °Date: ____________ Movements: ____________ Start time: ____________ Finish time: ____________ °Date: ____________ Movements: ____________ Start time: ____________ Finish time: ____________ °Date: ____________ Movements: ____________ Start time: ____________ Finish time: ____________ °  °This information is not intended to replace advice given to you by your health care provider. Make sure you discuss any questions you have with your health care provider. °  °Document Released: 11/24/2006 Document Revised: 11/15/2014 Document Reviewed: 08/21/2012 °[employer] Interactive Patient Education ©2016 [employer] °Braxton Hicks Contractions °Contractions of the uterus can occur throughout pregnancy. Contractions are not always a sign that you are in labor.  °WHAT ARE BRAXTON HICKS CONTRACTIONS?  °Contractions that occur before labor are called Braxton Hicks contractions, or false labor. Toward the end of pregnancy (32-34 weeks), these contractions can develop more often and may become more  forceful. This is not true labor because these contractions do not result in opening (dilatation) and thinning of the cervix. They are sometimes difficult to tell apart from true labor because these contractions can be forceful and people have different pain tolerances. You should   not feel embarrassed if you go to the hospital with false labor. Sometimes, the only way to tell if you are in true labor is for your health care provider to look for changes in the cervix. °If there are no prenatal problems or other health problems associated with the pregnancy, it is completely safe to be sent home with false labor and await the onset of true labor. °HOW CAN YOU TELL THE DIFFERENCE BETWEEN TRUE AND FALSE LABOR? °False Labor °· The contractions of false labor are usually shorter and not as hard as those of true labor.   °· The contractions are usually irregular.   °· The contractions are often felt in the front of the lower abdomen and in the groin.   °· The contractions may go away when you walk around or change positions while lying down.   °· The contractions get weaker and are shorter lasting as time goes on.   °· The contractions do not usually become progressively stronger, regular, and closer together as with true labor.   °True Labor °· Contractions in true labor last 30-70 seconds, become very regular, usually become more intense, and increase in frequency.   °· The contractions do not go away with walking.   °· The discomfort is usually felt in the top of the uterus and spreads to the lower abdomen and low back.   °· True labor can be determined by your health care provider with an exam. This will show that the cervix is dilating and getting thinner.   °WHAT TO REMEMBER °· Keep up with your usual exercises and follow other instructions given by your health care provider.   °· Take medicines as directed by your health care provider.   °· Keep your regular prenatal appointments.   °· Eat and drink lightly if you  think you are going into labor.   °· If Braxton Hicks contractions are making you uncomfortable:   °· Change your position from lying down or resting to walking, or from walking to resting.   °· Sit and rest in a tub of warm water.   °· Drink 2-3 glasses of water. Dehydration may cause these contractions.   °· Do slow and deep breathing several times an hour.   °WHEN SHOULD I SEEK IMMEDIATE MEDICAL CARE? °Seek immediate medical care if: °· Your contractions become stronger, more regular, and closer together.   °· You have fluid leaking or gushing from your vagina.   °· You have a fever.   °· You pass blood-tinged mucus.   °· You have vaginal bleeding.   °· You have continuous abdominal pain.   °· You have low back pain that you never had before.   °· You feel your baby's head pushing down and causing pelvic pressure.   °· Your baby is not moving as much as it used to.   °  °This information is not intended to replace advice given to you by your health care provider. Make sure you discuss any questions you have with your health care provider. °  °Document Released: 10/25/2005 Document Revised: 10/30/2013 Document Reviewed: 08/06/2013 °[employer]vier Interactive Patient Education ©2016 [employer] ° °

## 2016-05-16 NOTE — MAU Note (Signed)
Pt presents complaining of contractions every 6-8 minutes and discharge that is clear and watery. Planning TOLAC. Closed on last exam.

## 2016-05-16 NOTE — Progress Notes (Signed)
Notified of pt arrival in MAU, exam, and negative fern> Will discharge home with labor precautions

## 2016-05-18 ENCOUNTER — Encounter (HOSPITAL_COMMUNITY): Payer: Self-pay

## 2016-05-18 ENCOUNTER — Inpatient Hospital Stay (HOSPITAL_COMMUNITY)
Admission: AD | Admit: 2016-05-18 | Discharge: 2016-05-20 | DRG: 775 | Disposition: A | Discharge status: Home / Self Care | Payer: BLUE CROSS/BLUE SHIELD | Source: Ambulatory Visit | Attending: Obstetrics & Gynecology | Admitting: Obstetrics & Gynecology

## 2016-05-18 ENCOUNTER — Inpatient Hospital Stay (HOSPITAL_COMMUNITY): Payer: BLUE CROSS/BLUE SHIELD | Admitting: Anesthesiology

## 2016-05-18 ENCOUNTER — Encounter: Payer: Self-pay | Admitting: *Deleted

## 2016-05-18 DIAGNOSIS — Z825 Family history of asthma and other chronic lower respiratory diseases: Secondary | ICD-10-CM

## 2016-05-18 DIAGNOSIS — O4212 Full-term premature rupture of membranes, onset of labor more than 24 hours following rupture: Secondary | ICD-10-CM | POA: Diagnosis not present

## 2016-05-18 DIAGNOSIS — O34211 Maternal care for low transverse scar from previous cesarean delivery: Secondary | ICD-10-CM | POA: Diagnosis present

## 2016-05-18 DIAGNOSIS — O4202 Full-term premature rupture of membranes, onset of labor within 24 hours of rupture: Secondary | ICD-10-CM | POA: Diagnosis present

## 2016-05-18 DIAGNOSIS — O99214 Obesity complicating childbirth: Secondary | ICD-10-CM | POA: Diagnosis present

## 2016-05-18 DIAGNOSIS — Z8249 Family history of ischemic heart disease and other diseases of the circulatory system: Secondary | ICD-10-CM | POA: Diagnosis not present

## 2016-05-18 DIAGNOSIS — F17210 Nicotine dependence, cigarettes, uncomplicated: Secondary | ICD-10-CM | POA: Diagnosis present

## 2016-05-18 DIAGNOSIS — Z3493 Encounter for supervision of normal pregnancy, unspecified, third trimester: Secondary | ICD-10-CM

## 2016-05-18 DIAGNOSIS — Z833 Family history of diabetes mellitus: Secondary | ICD-10-CM

## 2016-05-18 DIAGNOSIS — Z6839 Body mass index (BMI) 39.0-39.9, adult: Secondary | ICD-10-CM | POA: Diagnosis not present

## 2016-05-18 DIAGNOSIS — Z3A39 39 weeks gestation of pregnancy: Secondary | ICD-10-CM | POA: Diagnosis not present

## 2016-05-18 DIAGNOSIS — O99334 Smoking (tobacco) complicating childbirth: Secondary | ICD-10-CM | POA: Diagnosis present

## 2016-05-18 DIAGNOSIS — E669 Obesity, unspecified: Secondary | ICD-10-CM | POA: Diagnosis present

## 2016-05-18 DIAGNOSIS — Z811 Family history of alcohol abuse and dependence: Secondary | ICD-10-CM | POA: Diagnosis not present

## 2016-05-18 LAB — URINALYSIS, ROUTINE W REFLEX MICROSCOPIC
BILIRUBIN URINE: NEGATIVE
GLUCOSE, UA: NEGATIVE mg/dL
HGB URINE DIPSTICK: NEGATIVE
Ketones, ur: NEGATIVE mg/dL
Leukocytes, UA: NEGATIVE
Nitrite: NEGATIVE
PH: 6 (ref 5.0–8.0)
Protein, ur: NEGATIVE mg/dL
SPECIFIC GRAVITY, URINE: 1.015 (ref 1.005–1.030)

## 2016-05-18 LAB — WET PREP, GENITAL
CLUE CELLS WET PREP: NONE SEEN
SPERM: NONE SEEN
Trich, Wet Prep: NONE SEEN
Yeast Wet Prep HPF POC: NONE SEEN

## 2016-05-18 LAB — CBC
HCT: 35.2 % — ABNORMAL LOW (ref 36.0–46.0)
Hemoglobin: 12.1 g/dL (ref 12.0–15.0)
MCH: 29.6 pg (ref 26.0–34.0)
MCHC: 34.4 g/dL (ref 30.0–36.0)
MCV: 86.1 fL (ref 78.0–100.0)
PLATELETS: 146 10*3/uL — AB (ref 150–400)
RBC: 4.09 MIL/uL (ref 3.87–5.11)
RDW: 15.5 % (ref 11.5–15.5)
WBC: 11.8 10*3/uL — AB (ref 4.0–10.5)

## 2016-05-18 LAB — TYPE AND SCREEN
ABO/RH(D): O POS
Antibody Screen: NEGATIVE

## 2016-05-18 LAB — POCT FERN TEST: POCT Fern Test: NEGATIVE

## 2016-05-18 LAB — AMNISURE RUPTURE OF MEMBRANE (ROM) NOT AT ARMC: Amnisure ROM: POSITIVE

## 2016-05-18 LAB — ABO/RH: ABO/RH(D): O POS

## 2016-05-18 MED ORDER — NICOTINE 21 MG/24HR TD PT24
21.0000 mg | MEDICATED_PATCH | Freq: Every day | TRANSDERMAL | Status: DC
Start: 2016-05-18 — End: 2016-05-20
  Administered 2016-05-18: 21 mg via TRANSDERMAL
  Filled 2016-05-18 (×4): qty 1

## 2016-05-18 MED ORDER — LACTATED RINGERS IV SOLN: 500.0000 mL | Freq: Once | INTRAVENOUS | Status: DC

## 2016-05-18 MED ORDER — OXYTOCIN 40 UNITS IN LACTATED RINGERS INFUSION - SIMPLE MED: 2.5000 [IU]/h | INTRAVENOUS | Status: DC

## 2016-05-18 MED ORDER — OXYCODONE-ACETAMINOPHEN 5-325 MG PO TABS
2.0000 | ORAL_TABLET | ORAL | Status: DC | PRN
Start: 2016-05-18 — End: 2016-05-19

## 2016-05-18 MED ORDER — EPHEDRINE 5 MG/ML INJ
10.0000 mg | INTRAVENOUS | Status: DC | PRN
  Filled 2016-05-18: qty 2

## 2016-05-18 MED ORDER — FENTANYL 2.5 MCG/ML BUPIVACAINE 1/10 % EPIDURAL INFUSION (WH - ANES)
14.0000 mL/h | INTRAMUSCULAR | Status: DC | PRN
  Administered 2016-05-18 – 2016-05-19 (×3): 14 mL/h via EPIDURAL
  Filled 2016-05-18 (×2): qty 125

## 2016-05-18 MED ORDER — TERBUTALINE SULFATE 1 MG/ML IJ SOLN
0.2500 mg | Freq: Once | INTRAMUSCULAR | Status: AC | PRN
  Administered 2016-05-19: 0.25 mg via SUBCUTANEOUS
  Filled 2016-05-18: qty 1

## 2016-05-18 MED ORDER — OXYTOCIN 40 UNITS IN LACTATED RINGERS INFUSION - SIMPLE MED
1.0000 m[IU]/min | INTRAVENOUS | Status: DC
  Administered 2016-05-18 – 2016-05-19 (×2): 2 m[IU]/min via INTRAVENOUS
  Filled 2016-05-18 (×2): qty 1000

## 2016-05-18 MED ORDER — DIPHENHYDRAMINE HCL 50 MG/ML IJ SOLN: 12.5000 mg | INTRAMUSCULAR | Status: DC | PRN

## 2016-05-18 MED ORDER — PHENYLEPHRINE 40 MCG/ML (10ML) SYRINGE FOR IV PUSH (FOR BLOOD PRESSURE SUPPORT)
80.0000 ug | PREFILLED_SYRINGE | INTRAVENOUS | Status: DC | PRN
  Filled 2016-05-18: qty 10
  Filled 2016-05-18: qty 5

## 2016-05-18 MED ORDER — LACTATED RINGERS IV SOLN
INTRAVENOUS | Status: DC
Start: 2016-05-18 — End: 2016-05-19
  Administered 2016-05-18 – 2016-05-19 (×3): via INTRAVENOUS

## 2016-05-18 MED ORDER — FENTANYL CITRATE (PF) 100 MCG/2ML IJ SOLN
100.0000 ug | INTRAMUSCULAR | Status: DC | PRN
  Administered 2016-05-18 (×4): 100 ug via INTRAVENOUS
  Filled 2016-05-18 (×4): qty 2

## 2016-05-18 MED ORDER — LACTATED RINGERS IV SOLN
500.0000 mL | INTRAVENOUS | Status: DC | PRN
  Administered 2016-05-19: 500 mL via INTRAVENOUS

## 2016-05-18 MED ORDER — OXYTOCIN BOLUS FROM INFUSION: 500.0000 mL | INTRAVENOUS | Status: DC

## 2016-05-18 MED ORDER — OXYCODONE-ACETAMINOPHEN 5-325 MG PO TABS: 1.0000 | ORAL_TABLET | ORAL | Status: DC | PRN

## 2016-05-18 MED ORDER — ONDANSETRON HCL 4 MG/2ML IJ SOLN: 4.0000 mg | Freq: Four times a day (QID) | INTRAMUSCULAR | Status: DC | PRN

## 2016-05-18 MED ORDER — LIDOCAINE HCL (PF) 1 % IJ SOLN
INTRAMUSCULAR | Status: DC | PRN
  Administered 2016-05-18: 3 mL via EPIDURAL
  Administered 2016-05-18: 2 mL via EPIDURAL
  Administered 2016-05-18: 5 mL via EPIDURAL

## 2016-05-18 MED ORDER — SOD CITRATE-CITRIC ACID 500-334 MG/5ML PO SOLN: 30.0000 mL | ORAL | Status: DC | PRN

## 2016-05-18 MED ORDER — ACETAMINOPHEN 325 MG PO TABS
650.0000 mg | ORAL_TABLET | ORAL | Status: DC | PRN
  Administered 2016-05-18 – 2016-05-19 (×2): 650 mg via ORAL
  Filled 2016-05-18 (×3): qty 2

## 2016-05-18 MED ORDER — LIDOCAINE HCL (PF) 1 % IJ SOLN
30.0000 mL | INTRAMUSCULAR | Status: DC | PRN
  Filled 2016-05-18: qty 30

## 2016-05-18 MED ORDER — PHENYLEPHRINE 40 MCG/ML (10ML) SYRINGE FOR IV PUSH (FOR BLOOD PRESSURE SUPPORT)
80.0000 ug | PREFILLED_SYRINGE | INTRAVENOUS | Status: DC | PRN
  Filled 2016-05-18: qty 5

## 2016-05-18 NOTE — Anesthesia Pain Management Evaluation Note (Signed)
  [facility] Pain Management Visit Note  Patient: Chesley NoonKatelynn Fix, 25 y.o., female  "Hello I am a [facility] of the anesthesia [facility] at [facility]. We have an anesthesia team available at all times to provide care throughout the hospital, including epidural management and anesthesia for C-section. I don't know your plan for the delivery whether it a natural birth, water birth, IV sedation, nitrous supplementation, doula or epidural, but we want to meet your pain goals."   1.Was your pain managed to your expectations on prior hospitalizations?     2.What is your expectation for pain management during this hospitalization?       3.How can we help you reach that goal?   Record the patient's initial score and the patient's pain goal.   Pain: 6  Pain Goal: 9 [facility] wants you to be able to say your pain was always managed very well.  Laban EmperorMalinova,Faust Thorington Hristova 05/18/2016

## 2016-05-18 NOTE — Anesthesia Preprocedure Evaluation (Signed)
Anesthesia Evaluation  Patient identified by MRN, date of birth, ID band Patient awake    Reviewed: Allergy & Precautions, NPO status , Patient's Chart, lab work & pertinent test results  Airway Mallampati: II  TM Distance: >3 FB Neck ROM: Full    Dental  (+) Teeth Intact, Dental Advisory Given   Pulmonary Current Smoker,    Pulmonary exam normal breath sounds clear to auscultation       Cardiovascular negative cardio ROS Normal cardiovascular exam Rhythm:Regular Rate:Normal     Neuro/Psych  Headaches, negative psych ROS   GI/Hepatic negative GI ROS, Neg liver ROS,   Endo/Other  Obesity   Renal/GU negative Renal ROS     Musculoskeletal negative musculoskeletal ROS (+)   Abdominal   Peds  Hematology  (+) Blood dyscrasia (thrombocytopenia), , Plt 146k   Anesthesia Other Findings Day of surgery medications reviewed with the patient.  Reproductive/Obstetrics (+) Pregnancy H/o c-section for breech                             Anesthesia Physical Anesthesia Plan  ASA: II  Anesthesia Plan: Epidural   Post-op Pain Management:    Induction:   Airway Management Planned:   Additional Equipment:   Intra-op Plan:   Post-operative Plan:   Informed Consent: I have reviewed the patients History and Physical, chart, labs and discussed the procedure including the risks, benefits and alternatives for the proposed anesthesia with the patient or authorized representative who has indicated his/her understanding and acceptance.   Dental advisory given  Plan Discussed with:   Anesthesia Plan Comments: (Patient identified. Risks/Benefits/Options discussed with patient including but not limited to bleeding, infection, nerve damage, paralysis, failed block, incomplete pain control, headache, blood pressure changes, nausea, vomiting, reactions to medication both or allergic, itching and postpartum  back pain. Confirmed with bedside nurse the patient's most recent platelet count. Confirmed with patient that they are not currently taking any anticoagulation, have any bleeding history or any family history of bleeding disorders. Patient expressed understanding and wished to proceed. All questions were answered. )        Anesthesia Quick Evaluation

## 2016-05-18 NOTE — H&P (Signed)
LABOR AND DELIVERY ADMISSION HISTORY AND PHYSICAL NOTE  Katherine Villegas is a 25 y.o. female [phone] with IUP at [redacted]w[redacted]d by LMP presenting for PROM  7/10.   She reports positive fetal movement. She denies vaginal bleeding.  Prenatal History/Complications:  Past Medical History: Past Medical History  Diagnosis Date  . Headache     Past Surgical History: Past Surgical History  Procedure Laterality Date  . Cesarean section    . External ear surgery    . Tonsillectomy    . Wisdom tooth extraction      Obstetrical History: OB History    Gravida Para Term Preterm AB TAB SAB Ectopic Multiple Living   Social History: Social History   Social History  . Marital Status: Single    Spouse Name: N/A  . Number of Children: N/A  . Years of Education: N/A   Social History Main Topics  . Smoking status: Current Every Day Smoker -- 0.25 packs/day for 14 years    Types: Cigarettes  . Smokeless tobacco: Never Used     Comment: smokes 3 cig when she smokes  . Alcohol Use: No     Comment: rare  . Drug Use: No  . Sexual Activity:    Partners: Male    Birth Control/ Protection: None   Other Topics Concern  . None   Social History Narrative    Family History: Family History  Problem Relation Age of Onset  . Migraines Mother   . Hypertension Mother   . Hyperlipidemia Mother   . Alcohol abuse Mother   . Diabetes Maternal Grandfather   . Alcohol abuse Father   . Arthritis Maternal Grandmother   . COPD Paternal Grandmother     Allergies: Allergies  Allergen Reactions  . Amoxicillin Other (See Comments)    Patient states it doesn't work due to overuse.  . Latex Itching and Rash    Prescriptions prior to admission  Medication Sig Dispense Refill Last Dose  . acetaminophen (TYLENOL) 500 MG tablet Take 1,000 mg by mouth every 6 (six) hours as needed for mild pain.   Past Week at Unknown time  . Prenatal Vit-Fe Fumarate-FA (PRENATAL MULTIVITAMIN)  TABS tablet Take 2 tablets by mouth daily at 12 noon.    05/17/2016 at Unknown time     Review of Systems   All systems reviewed and negative except as stated in HPI  Blood pressure 123/79, pulse 87, temperature 98.2 F (36.8 C), temperature source Oral, resp. rate 18, last menstrual period 08/17/2015. General appearance: alert, cooperative and no distress Lungs: clear to auscultation bilaterally Heart: regular rate and rhythm Abdomen: soft, non-tender; bowel sounds normal Extremities: No calf swelling or tenderness Presentation: cephalic on nurse exam Fetal monitoring: cat1 Uterine activity: irregular  Dilation: 1 Effacement (%): 70, 60 Station: -1 Exam by:: Quintella Baton rNC   Prenatal labs: ABO, Rh: O/Positive/-- (12/05 1106) Antibody: Negative (04/20 0926) Rubella: !Error! RPR: Non Reactive (04/20 0926)  HBsAg: Negative (12/05 1106)  HIV: Non Reactive (04/20 0926)  GBS:   neg 1 hr Glucola: passed Genetic screening:  Normal  Anatomy US: normal female   Prenatal Transfer Tool  Maternal Diabetes: No Genetic Screening: Normal Maternal Ultrasounds/Referrals: Normal Fetal Ultrasounds or other Referrals:  None Maternal Substance Abuse:  No Significant Maternal Medications:  Meds include: Progesterone Significant Maternal Lab Results: None  Results for orders placed or performed during the hospital encounter of  05/18/16 (from the past 24 hour(s))  Urinalysis, Routine w reflex microscopic (not [facility])   Collection Time: 05/18/16 11:32 AM  Result Value Ref Range   Color, Urine YELLOW YELLOW   APPearance CLEAR CLEAR   Specific Gravity, Urine 1.015 1.005 - 1.030   pH 6.0 5.0 - 8.0   Glucose, UA NEGATIVE NEGATIVE mg/dL   Hgb urine dipstick NEGATIVE NEGATIVE   Bilirubin Urine NEGATIVE NEGATIVE   Ketones, ur NEGATIVE NEGATIVE mg/dL   Protein, ur NEGATIVE NEGATIVE mg/dL   Nitrite NEGATIVE NEGATIVE   Leukocytes, UA NEGATIVE NEGATIVE  Fern Test   Collection Time: 05/18/16  11:46 AM  Result Value Ref Range   POCT Fern Test Negative = intact amniotic membranes   Wet prep, genital   Collection Time: 05/18/16 11:54 AM  Result Value Ref Range   Yeast Wet Prep HPF POC NONE SEEN NONE SEEN   Trich, Wet Prep NONE SEEN NONE SEEN   Clue Cells Wet Prep HPF POC NONE SEEN NONE SEEN   WBC, Wet Prep HPF POC MODERATE (A) NONE SEEN   Sperm NONE SEEN   Amnisure rupture of membrane (rom)not at [facility]   Collection Time: 05/18/16 11:54 AM  Result Value Ref Range   Amnisure ROM POSITIVE     Patient Active Problem List   Diagnosis Date Noted  . Previous cesarean section complicating pregnancy 10/13/2015  . H/O preterm delivery, currently pregnant 10/13/2015  . Supervision of normal pregnancy 10/13/2015  . Tobacco abuse 05/30/2015  . Chronic tension-type headache, not intractable 05/30/2015  . Intractable migraine with aura without status migrainosus 02/28/2015    Assessment: Katherine Villegas is a 25 y.o. [phone] at 2772w2d here for PROM@2300  7/10.  #Labor: PROM @2300 , consider foley. #Pain: IV fentanyl #FWB: Cat 1 #ID: GBS neg #MOF: breast #MOC: undecided   Katherine Villegas 05/18/2016, 12:34 PM  OB FELLOW HISTORY AND PHYSICAL ATTESTATION  I have seen and examined this patient; I agree with above documentation in the resident's note.  Tolac, will avoid cytotec.   Katherine Villegas 05/18/2016, 4:00 PM

## 2016-05-18 NOTE — MAU Note (Signed)
Last night around 1030, got out of bed, clear fluid started going down both legs. Small puddle of clear fluid.  Had continued. No bleeding. Having some contractions.

## 2016-05-18 NOTE — Anesthesia Procedure Notes (Signed)
Epidural Patient location during procedure: OB  Staffing Anesthesiologist: Danyel Tobey EDWARD Performed by: anesthesiologist   Preanesthetic Checklist Completed: patient identified, pre-op evaluation, timeout performed, IV checked, risks and benefits discussed and monitors and equipment checked  Epidural Patient position: sitting Prep: DuraPrep Patient monitoring: blood pressure and continuous pulse ox Approach: midline Location: L3-L4 Injection technique: LOR air  Needle:  Needle type: Tuohy  Needle gauge: 17 G Needle length: 9 cm Needle insertion depth: 6 cm Catheter size: 19 Gauge Catheter at skin depth: 10 cm Test dose: negative and Other (1% Lidocaine)  Additional Notes Patient identified.  Risk benefits discussed including failed block, incomplete pain control, headache, nerve damage, paralysis, blood pressure changes, nausea, vomiting, reactions to medication both toxic or allergic, and postpartum back pain.  Patient expressed understanding and wished to proceed.  All questions were answered.  Sterile technique used throughout procedure and epidural site dressed with sterile barrier dressing. No paresthesia or other complications noted. The patient did not experience any signs of intravascular injection such as tinnitus or metallic taste in mouth nor signs of intrathecal spread such as rapid motor block. Please see nursing notes for vital signs. Reason for block:procedure for pain   

## 2016-05-18 NOTE — Progress Notes (Signed)
Nicotine patch applied

## 2016-05-18 NOTE — Progress Notes (Signed)
LABOR PROGRESS NOTE  Katherine Villegas is a 25 y.o. [phone] at 4861w2d  admitted for PROM@2300 .   Subjective: Pt uncomfortable. Birthing ball is helping. Requesting nicotine patch and fentanyl.  Objective: BP 123/79 mmHg  Pulse 87  Temp(Src) 98.2 F (36.8 C) (Oral)  Resp 18  Ht 5' 2.25" (1.581 m)  Wt 97.523 kg (215 lb)  BMI 39.02 kg/m2  LMP 08/17/2015 (Exact Date) or  Filed Vitals:   05/18/16 1031 05/18/16 1315  BP: 123/79   Pulse: 87   Temp: 98.2 F (36.8 C)   TempSrc: Oral   Resp: 18   Height:  5' 2.25" (1.581 m)  Weight:  97.523 kg (215 lb)     Dilation: 1 Effacement (%): 70 Cervical Position: Middle Station: -1 Presentation: Vertex Exam by:: dr Ashok PallWouk  Labs: Lab Results  Component Value Date   WBC 11.8* 05/18/2016   HGB 12.1 05/18/2016   HCT 35.2* 05/18/2016   MCV 86.1 05/18/2016   PLT 146* 05/18/2016    Patient Active Problem List   Diagnosis Date Noted  . Full-term PROM with onset of labor within 24 hours of rupture 05/18/2016  . Previous cesarean section complicating pregnancy 10/13/2015  . H/O preterm delivery, currently pregnant 10/13/2015  . Supervision of normal pregnancy 10/13/2015  . Tobacco abuse 05/30/2015  . Chronic tension-type headache, not intractable 05/30/2015  . Intractable migraine with aura without status migrainosus 02/28/2015    Assessment / Plan: 25 y.o. [id number] at 7261w2d here for PROM@2300 .   Labor: s/p foley Fetal Wellbeing:  cat1 Pain Control:  fentanyl Anticipated MOD:  SVD  Katherine MuseKate Timberlake, MD 05/18/2016, 2:46 PM

## 2016-05-18 NOTE — Progress Notes (Signed)
LABOR PROGRESS NOTE  Chesley NoonKatelynn Vanderheyden is a 25 y.o. [phone] at 5512w2d  admitted for prom  Subjective: Mild pain  Objective: BP 111/57 mmHg  Pulse 63  Temp(Src) 96.3 F (35.7 C) (Axillary)  Resp 18  Ht 5' 2.25" (1.581 m)  Wt 215 lb (97.523 kg)  BMI 39.02 kg/m2  LMP 08/17/2015 (Exact Date) or  Filed Vitals:   05/18/16 1315 05/18/16 1518 05/18/16 1725 05/18/16 1935  BP:  114/64 108/66 111/57  Pulse:  63 71 63  Temp:  98.7 F (37.1 C) 97.7 F (36.5 C) 96.3 F (35.7 C)  TempSrc:  Oral Oral Axillary  Resp:  18  18  Height: 5' 2.25" (1.581 m)     Weight: 215 lb (97.523 kg)       140/mod/-a/-d  Dilation: 1 Effacement (%): 70 Cervical Position: Middle Station: -1 Presentation: Vertex Exam by:: dr Ashok PallWouk  Labs: Lab Results  Component Value Date   WBC 11.8* 05/18/2016   HGB 12.1 05/18/2016   HCT 35.2* 05/18/2016   MCV 86.1 05/18/2016   PLT 146* 05/18/2016    Patient Active Problem List   Diagnosis Date Noted  . Full-term PROM with onset of labor within 24 hours of rupture 05/18/2016  . Previous cesarean section complicating pregnancy 10/13/2015  . H/O preterm delivery, currently pregnant 10/13/2015  . Supervision of normal pregnancy 10/13/2015  . Tobacco abuse 05/30/2015  . Chronic tension-type headache, not intractable 05/30/2015  . Intractable migraine with aura without status migrainosus 02/28/2015    Assessment / Plan: 25 y.o. [id number] at 5612w2d here for prom  Labor: will start pit. Foley in place Fetal Wellbeing:  Cat 1 Pain Control:  Iv fentanyl Anticipated MOD:  Vag Prom: rom now 22 hours - starting pitocin. No s/s triple i  Silvano BilisNoah B Dannah Ryles, MD 05/18/2016, 9:06 PM

## 2016-05-18 NOTE — Progress Notes (Signed)
Patient ID: Katherine NoonKatelynn Villegas, female   DOB: [birth year], 25 y.o.   MRN: [id number]  Pt called in stating that she was leaking fluid at different times.  She said the fluid was clear with no odor.  Pt is 39 wks preg.  Discussed with nurse (Chrystal).  [facility]  Pt voiced understanding.  05-18-16  AS

## 2016-05-19 ENCOUNTER — Encounter (HOSPITAL_COMMUNITY): Payer: Self-pay | Admitting: *Deleted

## 2016-05-19 DIAGNOSIS — Z3A39 39 weeks gestation of pregnancy: Secondary | ICD-10-CM

## 2016-05-19 DIAGNOSIS — O4212 Full-term premature rupture of membranes, onset of labor more than 24 hours following rupture: Secondary | ICD-10-CM

## 2016-05-19 LAB — OB RESULTS CONSOLE GBS: STREP GROUP B AG: NEGATIVE

## 2016-05-19 LAB — GC/CHLAMYDIA PROBE AMP (~~LOC~~) NOT AT ARMC
CHLAMYDIA, DNA PROBE: NEGATIVE
Neisseria Gonorrhea: NEGATIVE

## 2016-05-19 LAB — RPR: RPR: NONREACTIVE

## 2016-05-19 MED ORDER — BENZOCAINE-MENTHOL 20-0.5 % EX AERO
1.0000 "application " | INHALATION_SPRAY | CUTANEOUS | Status: DC | PRN
  Administered 2016-05-19: 1 via TOPICAL
  Filled 2016-05-19: qty 56

## 2016-05-19 MED ORDER — ONDANSETRON HCL 4 MG PO TABS: 4.0000 mg | ORAL_TABLET | ORAL | Status: DC | PRN

## 2016-05-19 MED ORDER — ACETAMINOPHEN 160 MG/5ML PO SOLN
650.0000 mg | ORAL | Status: DC | PRN
  Administered 2016-05-20 (×2): 650 mg via ORAL
  Filled 2016-05-19 (×3): qty 20.3

## 2016-05-19 MED ORDER — ONDANSETRON HCL 4 MG/2ML IJ SOLN: 4.0000 mg | INTRAMUSCULAR | Status: DC | PRN

## 2016-05-19 MED ORDER — PRENATAL MULTIVITAMIN CH: 1.0000 | ORAL_TABLET | Freq: Every day | ORAL | Status: DC

## 2016-05-19 MED ORDER — ZOLPIDEM TARTRATE 5 MG PO TABS: 5.0000 mg | ORAL_TABLET | Freq: Every evening | ORAL | Status: DC | PRN

## 2016-05-19 MED ORDER — DIBUCAINE 1 % RE OINT: 1.0000 "application " | TOPICAL_OINTMENT | RECTAL | Status: DC | PRN

## 2016-05-19 MED ORDER — COMPLETENATE 29-1 MG PO CHEW
1.0000 | CHEWABLE_TABLET | Freq: Every day | ORAL | Status: DC
  Administered 2016-05-19: 1 via ORAL
  Filled 2016-05-19 (×3): qty 1

## 2016-05-19 MED ORDER — SENNOSIDES-DOCUSATE SODIUM 8.6-50 MG PO TABS
2.0000 | ORAL_TABLET | ORAL | Status: DC
  Filled 2016-05-19: qty 2

## 2016-05-19 MED ORDER — SIMETHICONE 80 MG PO CHEW: 80.0000 mg | CHEWABLE_TABLET | ORAL | Status: DC | PRN

## 2016-05-19 MED ORDER — IBUPROFEN 100 MG/5ML PO SUSP
600.0000 mg | Freq: Four times a day (QID) | ORAL | Status: DC
  Administered 2016-05-19 – 2016-05-20 (×3): 600 mg via ORAL
  Filled 2016-05-19 (×8): qty 30

## 2016-05-19 MED ORDER — COCONUT OIL OIL: 1.0000 "application " | TOPICAL_OIL | Status: DC | PRN

## 2016-05-19 MED ORDER — LACTATED RINGERS IV SOLN
INTRAVENOUS | Status: DC
  Administered 2016-05-19 (×2): via INTRAUTERINE

## 2016-05-19 MED ORDER — WITCH HAZEL-GLYCERIN EX PADS: 1.0000 "application " | MEDICATED_PAD | CUTANEOUS | Status: DC | PRN

## 2016-05-19 MED ORDER — TETANUS-DIPHTH-ACELL PERTUSSIS 5-2.5-18.5 LF-MCG/0.5 IM SUSP: 0.5000 mL | Freq: Once | INTRAMUSCULAR | Status: DC

## 2016-05-19 MED ORDER — IBUPROFEN 600 MG PO TABS: 600.0000 mg | ORAL_TABLET | Freq: Four times a day (QID) | ORAL | Status: DC

## 2016-05-19 MED ORDER — ACETAMINOPHEN 325 MG PO TABS: 650.0000 mg | ORAL_TABLET | ORAL | Status: DC | PRN

## 2016-05-19 MED ORDER — DIPHENHYDRAMINE HCL 25 MG PO CAPS: 25.0000 mg | ORAL_CAPSULE | Freq: Four times a day (QID) | ORAL | Status: DC | PRN

## 2016-05-19 NOTE — Progress Notes (Signed)
LABOR PROGRESS NOTE  Katherine Villegas is a 25 y.o. [phone] at 7662w2d  admitted for prom  Subjective: Mild pain  Objective: BP 102/50 mmHg  Pulse 72  Temp(Src) 97.9 F (36.6 C) (Oral)  Resp 16  Ht 5' 2.25" (1.581 m)  Wt 215 lb (97.523 kg)  BMI 39.02 kg/m2  LMP 08/17/2015 (Exact Date) or  Filed Vitals:   05/19/16 0130 05/19/16 0300 05/19/16 0330 05/19/16 0333  BP:  117/67 102/50 102/50  Pulse:  58 72 72  Temp: 97.9 F (36.6 C)     TempSrc: Oral     Resp: 16 16 16    Height:      Weight:        140/mod/-a/recurrent lates Dilation: 5 Effacement (%): 60 Cervical Position: Middle Station: -2 Presentation: Vertex Exam by:: Alfonso PattenN Jaise Moser, MD  Labs: Lab Results  Component Value Date   WBC 11.8* 05/18/2016   HGB 12.1 05/18/2016   HCT 35.2* 05/18/2016   MCV 86.1 05/18/2016   PLT 146* 05/18/2016    Patient Active Problem List   Diagnosis Date Noted  . Full-term PROM with onset of labor within 24 hours of rupture 05/18/2016  . Previous cesarean section complicating pregnancy 10/13/2015  . H/O preterm delivery, currently pregnant 10/13/2015  . Supervision of normal pregnancy 10/13/2015  . Tobacco abuse 05/30/2015  . Chronic tension-type headache, not intractable 05/30/2015  . Intractable migraine with aura without status migrainosus 02/28/2015    Assessment / Plan: 25 y.o. [id number] at 6462w2d here for prom  Labor: off pit for ecurrent lates no responding to bolus and repositioning. iupc placed, amnioinfusion started, but with continued recurrent lates. Terbutaline given 20 minutes ago, strip cat 1 now. Will see whether native contractions resume, and will re-start pit as needed. Discussed w/ Katherine Villegas Fetal Wellbeing:  Cat 2, improved to cat 1 after terbutaline Pain Control:  Iv fentanyl Anticipated MOD:  Vag Prom: rom now > 24 hours. No s/s triple i  Silvano BilisNoah B Peityn Payton, MD 05/19/2016, 3:54 AM

## 2016-05-19 NOTE — Anesthesia Postprocedure Evaluation (Signed)
Anesthesia Post Note  Patient: Katherine Villegas  Procedure(s) Performed: * No procedures listed *  Patient location during evaluation: Mother Baby Anesthesia Type: Epidural Level of consciousness: awake Pain management: pain level controlled Vital Signs Assessment: post-procedure vital signs reviewed and stable Respiratory status: spontaneous breathing Cardiovascular status: stable Postop Assessment: no headache, no backache, epidural receding, patient able to bend at knees, no signs of nausea or vomiting and adequate PO intake Anesthetic complications: no     Last Vitals:  Filed Vitals:   05/19/16 1422 05/19/16 1534  BP: 117/66 120/67  Pulse: 64 73  Temp: 36.4 C 36.4 C  Resp: 18 16    Last Pain:  Filed Vitals:   05/19/16 1552  PainSc: 0-No pain   Pain Goal:                 Katherine Villegas

## 2016-05-19 NOTE — Lactation Note (Signed)
This note was copied from a baby's chart. Lactation Consultation Note  Patient Name: Katherine Villegas ZOXWR'UToday's Date: 05/19/2016 Reason for consult: Initial assessment Baby at 5 hr of life and mom reports she is latching well. She denies breast or nipple pain. She reports "lots of breast changes over the last week". Her older child was in the NICU and mom pumped only for her. Discussed baby behavior, feeding frequency, baby belly size, voids, wt loss, breast changes, and nipple care. Mom stated she can manually express and has spoon in room. Given lactation handouts. Aware of OP services and support group. She will call as needed.     Maternal Data Has patient been taught Hand Expression?: Yes Does the patient have breastfeeding experience prior to this delivery?: Yes  Feeding Feeding Type: Breast Fed  LATCH Score/Interventions Latch: Repeated attempts needed to sustain latch, nipple held in mouth throughout feeding, stimulation needed to elicit sucking reflex. Intervention(s): Adjust position;Assist with latch;Breast compression  Audible Swallowing: A few with stimulation Intervention(s): Skin to skin;Hand expression  Type of Nipple: Everted at rest and after stimulation  Comfort (Breast/Nipple): Soft / non-tender     Hold (Positioning): Assistance needed to correctly position infant at breast and maintain latch. Intervention(s): Breastfeeding basics reviewed;Support Pillows;Position options;Skin to skin  LATCH Score: 7  Lactation Tools Discussed/Used WIC Program: No   Consult Status Consult Status: Follow-up Date: 05/20/16 Follow-up type: In-patient    Rulon Eisenmengerlizabeth E Hezakiah Champeau 05/19/2016, 5:13 PM

## 2016-05-19 NOTE — Anesthesia Rounding Note (Signed)
  [facility] Epidural Rounding Note  Patient: Katherine Villegas, 25 y.o., female  Patient's current pain level: Pain Score: 0-No pain (05/19/16 0330)  Agreed upon pain management level: Patient sleeping - unable to assess  Epidural intervention: Unable to assess - patient sleeping  Comments:   [facility],[name] 05/19/2016

## 2016-05-19 NOTE — Progress Notes (Signed)
LABOR PROGRESS NOTE  Katherine Villegas is a 25 y.o. [phone] at 1143w2d  admitted for prom  Subjective: Mild pain  Objective: BP 99/47 mmHg  Pulse 63  Temp(Src) 97.9 F (36.6 C) (Oral)  Resp 16  Ht 5' 2.25" (1.581 m)  Wt 215 lb (97.523 kg)  BMI 39.02 kg/m2  LMP 08/17/2015 (Exact Date) or  Filed Vitals:   05/19/16 0000 05/19/16 0030 05/19/16 0100 05/19/16 0130  BP: 113/72 110/65 99/47   Pulse: 68 67 63   Temp:    97.9 F (36.6 C)  TempSrc:    Oral  Resp: 20 20 20 16   Height:      Weight:        140/mod/-a/recurrent lates Dilation: 5 Effacement (%): 60 Cervical Position: Middle Station: -1 Presentation: Vertex Exam by:: Violeta GelinasG. Whitfield, RN  Labs: Lab Results  Component Value Date   WBC 11.8* 05/18/2016   HGB 12.1 05/18/2016   HCT 35.2* 05/18/2016   MCV 86.1 05/18/2016   PLT 146* 05/18/2016    Patient Active Problem List   Diagnosis Date Noted  . Full-term PROM with onset of labor within 24 hours of rupture 05/18/2016  . Previous cesarean section complicating pregnancy 10/13/2015  . H/O preterm delivery, currently pregnant 10/13/2015  . Supervision of normal pregnancy 10/13/2015  . Tobacco abuse 05/30/2015  . Chronic tension-type headache, not intractable 05/30/2015  . Intractable migraine with aura without status migrainosus 02/28/2015    Assessment / Plan: 25 y.o. [id number] at 8243w2d here for prom  Labor: off pit for ecurrent lates no responding to bolus and repositioning. Will re-start in one hour. IUPC placed just now for pit titration Fetal Wellbeing:  Cat 2, improved after stopping pitocin Pain Control:  Iv fentanyl Anticipated MOD:  Vag Prom: rom now > 24 hours. No s/s triple i  Silvano BilisNoah B Hubert Derstine, MD 05/19/2016, 1:55 AM

## 2016-05-19 NOTE — Progress Notes (Signed)
Pt. Refused nicotine patch and stated she tried one last time and the patch made her "angry". She went outside with her sister about 11930 while dad watched infant. She absolutely refused to keep IV in until 24 hrs. Despite being explained she was a hi PP hemmorrhage risk being a vbac. IV was removed at 2030 at pt.'s insistence.

## 2016-05-19 NOTE — Progress Notes (Signed)
LABOR PROGRESS NOTE  Katherine Villegas is a 25 y.o. [phone] at 6335w3d  admitted for PROM.   Subjective: Pt itching but otherwise comfortable. Reassured her that forceps or vacuum are not routinely used and that we don't think those will be likely today.  Objective: BP 107/50 mmHg  Pulse 75  Temp(Src) 98 F (36.7 C) (Axillary)  Resp 18  Ht 5' 2.25" (1.581 m)  Wt 97.523 kg (215 lb)  BMI 39.02 kg/m2  LMP 08/17/2015 (Exact Date) or  Filed Vitals:   05/19/16 0831 05/19/16 0900 05/19/16 0931 05/19/16 1001  BP: 106/55 118/61 99/56 107/50  Pulse: 60 77 59 75  Temp:  98.6 F (37 C)  98 F (36.7 C)  TempSrc:  Oral  Axillary  Resp:  18 18 18   Height:      Weight:         Dilation: 8 Effacement (%): 100 Cervical Position: Middle Station: 0 Presentation: Vertex Exam by:: Violeta GelinasG. Whitfield, RN  Labs: Lab Results  Component Value Date   WBC 11.8* 05/18/2016   HGB 12.1 05/18/2016   HCT 35.2* 05/18/2016   MCV 86.1 05/18/2016   PLT 146* 05/18/2016    Patient Active Problem List   Diagnosis Date Noted  . Full-term PROM with onset of labor within 24 hours of rupture 05/18/2016  . Previous cesarean section complicating pregnancy 10/13/2015  . H/O preterm delivery, currently pregnant 10/13/2015  . Supervision of normal pregnancy 10/13/2015  . Tobacco abuse 05/30/2015  . Chronic tension-type headache, not intractable 05/30/2015  . Intractable migraine with aura without status migrainosus 02/28/2015    Assessment / Plan: 25 y.o. [id number] at 1835w3d here for PROM  Labor: pit break 1000-1030, s/p restarting pit Fetal Wellbeing:  Occasional decels but frequent accels Pain Control:  S/p epidural Anticipated MOD:  SVD  Loni MuseKate Timberlake, MD 05/19/2016, 10:36 AM

## 2016-05-20 ENCOUNTER — Encounter: Payer: BLUE CROSS/BLUE SHIELD | Admitting: Advanced Practice Midwife

## 2016-05-20 DIAGNOSIS — O99214 Obesity complicating childbirth: Secondary | ICD-10-CM

## 2016-05-20 DIAGNOSIS — O34211 Maternal care for low transverse scar from previous cesarean delivery: Secondary | ICD-10-CM

## 2016-05-20 DIAGNOSIS — Z3A39 39 weeks gestation of pregnancy: Secondary | ICD-10-CM

## 2016-05-20 DIAGNOSIS — O4202 Full-term premature rupture of membranes, onset of labor within 24 hours of rupture: Secondary | ICD-10-CM

## 2016-05-20 DIAGNOSIS — O99334 Smoking (tobacco) complicating childbirth: Secondary | ICD-10-CM

## 2016-05-20 MED ORDER — IBUPROFEN 100 MG/5ML PO SUSP: 600.0000 mg | Freq: Four times a day (QID) | ORAL | Status: DC | PRN

## 2016-05-20 NOTE — Discharge Instructions (Signed)

## 2016-05-20 NOTE — Lactation Note (Signed)
This note was copied from a baby's chart. Lactation Consultation Note  Patient Name: Katherine Villegas ZOXWR'UToday's Date: 05/20/2016   Visited with Mom on day of discharge, baby 3423 hrs old. Mom resting in bed, with baby skin to skin.  Mom reports baby is cluster feeding, and fussy if not skin to skin on Mom's chest.  Suggested she alternate skin to skin with FOB, so she can rest.  Mom reports a deep latch, and swallowing heard.  Mom feeding often on cue.  Baby has fed 11 times in 24 hrs.  Output good, only 3% weight loss. Engorgement prevention and treatment discussed.  Reminded Mom of OP lactation services available to her, and encouraged to call prn.    Judee ClaraSmith, [place] Boccio E 05/20/2016, 11:50 AM

## 2016-05-20 NOTE — Discharge Summary (Signed)
OB Discharge Summary     Patient Name: Katherine Villegas DOB: 1991/04/29 MRN: [id number]  Date of admission: 05/18/2016 Delivering MD: Garth BignessIMBERLAKE, KATHRYN   Date of discharge: 05/20/2016  Admitting diagnosis: 39WKS,LEAKING Intrauterine pregnancy: 5574w3d     Secondary diagnosis:  Active Problems:   Full-term PROM with onset of labor within 24 hours of rupture  Additional problems: prev C/S due to PPROM/breech     Discharge diagnosis: Term Pregnancy Delivered and VBAC                                                                                                Post partum procedures:none  Augmentation: Pitocin and Foley Balloon  Complications: ROM>24 hours: total time x 37hrs; no s/s Triple I  Hospital course:  Induction of Labor With Vaginal Delivery   25 y.o. yo [id number] at 2174w3d was admitted to the hospital 05/18/2016 for induction of labor.  Indication for induction: PROM.  Patient had an uncomplicated labor course as follows: Membrane Rupture Time/Date: 10:30 PM ,05/18/2016   Intrapartum Procedures: Episiotomy: None [1]                                         Lacerations:  2nd degree [3];Perineal [11]  Patient had delivery of a Viable infant.  Information for the patient's newborn:  Clancy GourdUtter, [facility] [[id number]][[id number]]  Delivery Method: VBAC, Spontaneous (Filed from Delivery Summary)   05/19/2016  Details of delivery can be found in separate delivery note.  Patient had a routine postpartum course. Patient is discharged home 05/22/2016.   Physical exam  Filed Vitals:   05/19/16 1422 05/19/16 1534 05/19/16 1924 05/20/16 0610  BP: 117/66 120/67 108/56 107/63  Pulse: 64 73 62 59  Temp: 97.5 F (36.4 C) 97.6 F (36.4 C) 98 F (36.7 C) 98 F (36.7 C)  TempSrc: Oral Oral Axillary   Resp: 18 16  18   Height:      Weight:      SpO2: 100%      General: alert and cooperative Lochia: appropriate Uterine Fundus: firm Incision: N/A DVT Evaluation: No evidence of DVT seen on  physical exam. Labs: Lab Results  Component Value Date   WBC 11.8* 05/18/2016   HGB 12.1 05/18/2016   HCT 35.2* 05/18/2016   MCV 86.1 05/18/2016   PLT 146* 05/18/2016   CMP Latest Ref Rng 10/24/2014  Glucose 70 - 99 mg/dL 696(E114(H)  BUN 6 - 23 mg/dL 9  Creatinine 9.520.50 - 8.411.10 mg/dL 3.240.61  Sodium 401137 - 027147 mEq/L 138  Potassium 3.7 - 5.3 mEq/L 4.1  Chloride 96 - 112 mEq/L 103  CO2 19 - 32 mEq/L 22  Calcium 8.4 - 10.5 mg/dL 9.4    Discharge instruction: per After Visit Summary and "Baby and Me Booklet".  After visit meds:    Medication List    STOP taking these medications        acetaminophen 500 MG tablet  Commonly known as:  TYLENOL      TAKE  these medications        ibuprofen 100 MG/5ML suspension  Commonly known as:  ADVIL,MOTRIN  Take 30 mLs (600 mg total) by mouth every 6 (six) hours as needed.     prenatal multivitamin Tabs tablet  Take 2 tablets by mouth daily at 12 noon.        Diet: routine diet  Activity: Advance as tolerated. Pelvic rest for 6 weeks.   Outpatient follow up:6 weeks Follow up Appt:Future Appointments Date Time Provider Department Center  05/20/2016 3:30 PM Jacklyn Shell, CNM [facility]   Follow up Visit:No Follow-up on file.  Postpartum contraception: Condoms  Newborn Data: Live born female  Birth Weight: 7 lb 5.3 oz (3325 g) APGAR: 8, 9  Baby Feeding: Breast Disposition:home with mother   05/20/2016 Cam Hai, CNM

## 2016-07-01 ENCOUNTER — Ambulatory Visit: Payer: BLUE CROSS/BLUE SHIELD | Admitting: Obstetrics and Gynecology

## 2016-07-02 ENCOUNTER — Ambulatory Visit (INDEPENDENT_AMBULATORY_CARE_PROVIDER_SITE_OTHER): Payer: BLUE CROSS/BLUE SHIELD | Admitting: Obstetrics and Gynecology

## 2016-07-02 ENCOUNTER — Encounter: Payer: Self-pay | Admitting: Obstetrics and Gynecology

## 2016-07-02 VITALS — BP 104/76 | HR 68 | Wt 182.4 lb

## 2016-07-02 DIAGNOSIS — Z3493 Encounter for supervision of normal pregnancy, unspecified, third trimester: Secondary | ICD-10-CM

## 2016-07-02 DIAGNOSIS — O34219 Maternal care for unspecified type scar from previous cesarean delivery: Secondary | ICD-10-CM

## 2016-07-02 NOTE — Progress Notes (Signed)
   Subjective:  Katherine Villegas is a 25 y.o. female who presents for a 6 weeks postpartum visit. Patient has no acute concerns.  Pt notes she had an episiotomy during her vaginal delivery.  Prenatal and intrapartum course notable for nothing Patient is not sexually active. She is requesting nexplanon for birth control.   The following portions of the patient's history were reviewed and updated as appropriate: allergies, current medications, past family history, past medical history, past surgical history and problem list.  Review of Systems    See Subjective, otherwise negative ROS.  Objective:  BP 104/76 (BP Location: Right Arm, Patient Position: Sitting, Cuff Size: Normal)   Pulse 68   Wt 182 lb 6.4 oz (82.7 kg)   Breastfeeding? No   BMI 33.09 kg/m   General:  alert, cooperative and no distress     Lungs: clear to auscultation bilaterally  Heart:  regular rate and rhythm, S1, S2 normal, no murmur  Abdomen: soft, non-tender; bowel sounds normal; no masses,  no organomegaly   Vulva:  normal  Vagina: normal vagina  Cervix:  Normal; healthy secretions   Corpus: normal size, contour, position, consistency, mobility, non-tender  Adnexa:  normal adnexa           Assessment:  1.  Postpartum exam.  2. Contraception: Nexplanon  Plan:  Follow up within 1 week for nexplanon insertion.   By signing my name below, I, Freida BusmanDiana Omoyeni, attest that this documentation has been prepared under the direction and in the presence of Tilda BurrowJohn V Liam Bossman, MD . Electronically Signed: Freida Busmaniana Omoyeni, Scribe. 07/02/2016. 12:31 PM. I personally performed the services described in this documentation, which was SCRIBED in my presence. The recorded information has been reviewed and considered accurate. It has been edited as necessary during review. Tilda BurrowFERGUSON,Amiayah Giebel V, MD

## 2016-07-06 ENCOUNTER — Encounter: Payer: Self-pay | Admitting: Obstetrics and Gynecology

## 2016-07-21 ENCOUNTER — Ambulatory Visit (INDEPENDENT_AMBULATORY_CARE_PROVIDER_SITE_OTHER): Payer: BLUE CROSS/BLUE SHIELD | Admitting: Obstetrics and Gynecology

## 2016-07-21 ENCOUNTER — Encounter: Payer: Self-pay | Admitting: Obstetrics and Gynecology

## 2016-07-21 VITALS — BP 82/60 | HR 86 | Ht 62.0 in | Wt 181.2 lb

## 2016-07-21 DIAGNOSIS — O3680X0 Pregnancy with inconclusive fetal viability, not applicable or unspecified: Secondary | ICD-10-CM | POA: Insufficient documentation

## 2016-07-21 DIAGNOSIS — Z3202 Encounter for pregnancy test, result negative: Secondary | ICD-10-CM | POA: Diagnosis not present

## 2016-07-21 DIAGNOSIS — Z3049 Encounter for surveillance of other contraceptives: Secondary | ICD-10-CM | POA: Diagnosis not present

## 2016-07-21 DIAGNOSIS — Z30017 Encounter for initial prescription of implantable subdermal contraceptive: Secondary | ICD-10-CM

## 2016-07-21 LAB — POCT URINE PREGNANCY: PREG TEST UR: NEGATIVE

## 2016-07-21 NOTE — Progress Notes (Signed)
Patient ID: Katherine Villegas, female   DOB: [birth year], 25 y.o.   MRN: [id number]   GYNECOLOGY CLINIC PROCEDURE NOTE  Katherine NoonKatelynn Prill is a 25 y.o. [id number] here for Nexplanon insertion. Urine preg negative today. She states her contraception since delivery has been abstinence.   She does note gradually improving tailbone pain for 2 months s/p delivery, which is worsened with movement and direct pressure.   Nexplanon Insertion Procedure Patient identified, informed consent performed, consent signed.   Patient does understand that irregular bleeding is a very common side effect of this medication. She was advised to have backup contraception for one week after placement. Pregnancy test in clinic today was negative.  Appropriate time out taken.  Patient's left arm was prepped and draped in the usual sterile fashion. The ruler used to measure and mark insertion area.  Patient was prepped with alcohol swab and then injected with 3 ml of 1% lidocaine.  She was prepped with betadine, Nexplanon removed from packaging,  Device confirmed in needle, then inserted full length of needle and withdrawn per handbook instructions. Nexplanon was able to palpated in the patient's arm; patient palpated the insert herself. There was minimal blood loss.  Patient insertion site covered with guaze and a pressure bandage to reduce any bruising.  The patient tolerated the procedure well and was given post procedure instructions.    By signing my name below, I, Doreatha MartinEva Mathews, attest that this documentation has been prepared under the direction and in the presence of Tilda BurrowJohn V Byrdie Miyazaki, MD. Electronically Signed: Doreatha MartinEva Mathews, ED Scribe. 07/21/16. 11:45 AM.  I personally performed the services described in this documentation, which was SCRIBED in my presence. The recorded information has been reviewed and considered accurate. It has been edited as necessary during review. Tilda BurrowFERGUSON,Chae Oommen V, MD

## 2016-07-26 ENCOUNTER — Other Ambulatory Visit: Payer: Self-pay | Admitting: Neurology

## 2016-08-12 ENCOUNTER — Ambulatory Visit (INDEPENDENT_AMBULATORY_CARE_PROVIDER_SITE_OTHER): Payer: BLUE CROSS/BLUE SHIELD | Admitting: Obstetrics & Gynecology

## 2016-08-12 ENCOUNTER — Encounter: Payer: Self-pay | Admitting: Obstetrics & Gynecology

## 2016-08-12 VITALS — BP 100/60 | HR 72 | Wt 186.4 lb

## 2016-08-12 DIAGNOSIS — N9411 Superficial (introital) dyspareunia: Secondary | ICD-10-CM

## 2016-08-12 MED ORDER — ESTRADIOL 0.1 MG/GM VA CREA: 1.0000 | TOPICAL_CREAM | Freq: Every day | VAGINAL | 12 refills | Status: DC

## 2016-08-12 NOTE — Progress Notes (Signed)
Chief Complaint  Patient presents with  . check episiotomy    skin covering over vaginal area.    Blood pressure 100/60, pulse 72, weight 186 lb 6.4 oz (84.6 kg), last menstrual period 08/08/2016, not currently breastfeeding.  25 y.o. [id number] Patient's last menstrual period was 08/08/2016. The current method of family planning is Nexplanon.  Outpatient Encounter Prescriptions as of 08/12/2016  Medication Sig  . acetaminophen (TYLENOL) 325 MG tablet Take 650 mg by mouth every 6 (six) hours as needed.  Marland Kitchen. ibuprofen (ADVIL,MOTRIN) 100 MG/5ML suspension Take 30 mLs (600 mg total) by mouth every 6 (six) hours as needed.  . Prenatal Vit-Fe Fumarate-FA (PRENATAL MULTIVITAMIN) TABS tablet Take 2 tablets by mouth daily at 12 noon.   Marland Kitchen. estradiol (ESTRACE VAGINAL) 0.1 MG/GM vaginal cream Place 1 Applicatorful vaginally at bedtime.  . [DISCONTINUED] topiramate (TOPAMAX) 200 MG tablet Take 200 mg by mouth 2 (two) times daily.   No facility-administered encounter medications on file as of 08/12/2016.     Subjective Pt has not breast fed for 1 month She is using nexplanon Two weeks ago she noticed that when she tried to have sex for the first time, insertion hurt and she started crying.   She had her husband look and he/she noticed a"flap of skin" going across her vagina To put a tampon in she has to squat down and lean forward to insert it comfortably, it hurts to place it on the toilet as she normally does  Objective Gen WDWN female NAD Vulva/perineum there is a perineal tight band, looks like the way she healed, not the way she was repaired, she did not have any problems with sutures/repair Vagina non tender well healed  Pertinent ROS No burning with urination, frequency or urgency No nausea, vomiting or diarrhea Nor fever chills or other constitutional symptoms   Labs or studies     Impression Diagnoses this Encounter::   ICD-9-CM ICD-10-CM   1. Superficial (introital)  dyspareunia 625.0 N94.11     Established relevant diagnosis(es): Postpartum 3 months  Plan/Recommendations: Meds ordered this encounter  Medications  . acetaminophen (TYLENOL) 325 MG tablet    Sig: Take 650 mg by mouth every 6 (six) hours as needed.  Marland Kitchen. estradiol (ESTRACE VAGINAL) 0.1 MG/GM vaginal cream    Sig: Place 1 Applicatorful vaginally at bedtime.    Dispense:  42.5 g    Refill:  12    Labs or Scans Ordered: No orders of the defined types were placed in this encounter.   Management:: Will try some topical estrogen to see if the tissue can get healthier, even if we end up doing an incision with repair  Follow up Return in about 1 month (around 09/12/2016) for Follow up, with Dr Despina HiddenEure.       All questions were answered.  Past Medical History:  Diagnosis Date  . Headache     Past Surgical History:  Procedure Laterality Date  . CESAREAN SECTION    . EXTERNAL EAR SURGERY    . TONSILLECTOMY    . WISDOM TOOTH EXTRACTION      OB History    Gravida Para Term Preterm AB Living   4 2 1 1 2 2    SAB TAB Ectopic Multiple Live Births   2     0 2      Allergies  Allergen Reactions  . Amoxicillin Other (See Comments)    Patient states it doesn't work due to overuse.  . Latex  Itching, Swelling and Rash    Social History   Social History  . Marital status: Single    Spouse name: N/A  . Number of children: N/A  . Years of education: N/A   Social History Main Topics  . Smoking status: Current Every Day Smoker    Packs/day: 0.25    Years: 14.00    Types: Cigarettes  . Smokeless tobacco: Never Used     Comment: smokes 3 cig when she smokes  . Alcohol use No     Comment: rare  . Drug use: No  . Sexual activity: Not Currently    Partners: Male    Birth control/ protection: None   Other Topics Concern  . None   Social History Narrative  . None    Family History  Problem Relation Age of Onset  . Migraines Mother   . Hypertension Mother   .  Hyperlipidemia Mother   . Alcohol abuse Mother   . Diabetes Maternal Grandfather   . Alcohol abuse Father   . Arthritis Maternal Grandmother   . COPD Paternal Grandmother

## 2016-09-09 ENCOUNTER — Ambulatory Visit: Payer: BLUE CROSS/BLUE SHIELD | Admitting: Obstetrics & Gynecology

## 2016-09-14 ENCOUNTER — Ambulatory Visit (INDEPENDENT_AMBULATORY_CARE_PROVIDER_SITE_OTHER): Payer: BLUE CROSS/BLUE SHIELD | Admitting: Neurology

## 2016-09-14 ENCOUNTER — Encounter: Payer: Self-pay | Admitting: Neurology

## 2016-09-14 ENCOUNTER — Other Ambulatory Visit (INDEPENDENT_AMBULATORY_CARE_PROVIDER_SITE_OTHER): Payer: BLUE CROSS/BLUE SHIELD

## 2016-09-14 VITALS — HR 113 | Ht 62.0 in | Wt 184.0 lb

## 2016-09-14 DIAGNOSIS — G43009 Migraine without aura, not intractable, without status migrainosus: Secondary | ICD-10-CM

## 2016-09-14 LAB — BASIC METABOLIC PANEL
BUN: 9 mg/dL (ref 6–23)
CHLORIDE: 105 meq/L (ref 96–112)
CO2: 26 meq/L (ref 19–32)
CREATININE: 0.71 mg/dL (ref 0.40–1.20)
Calcium: 9.8 mg/dL (ref 8.4–10.5)
GFR: 106.06 mL/min (ref >60.00)
Glucose, Bld: 108 mg/dL — ABNORMAL HIGH (ref 70–99)
Potassium: 4 mEq/L (ref 3.5–5.1)
Sodium: 139 mEq/L (ref 135–145)

## 2016-09-14 MED ORDER — PREDNISONE 10 MG PO TABS: ORAL_TABLET | ORAL | 0 refills | Status: DC

## 2016-09-14 MED ORDER — SUMATRIPTAN SUCCINATE 100 MG PO TABS
ORAL_TABLET | ORAL | 2 refills | Status: DC
Start: 2016-09-14 — End: 2017-12-23

## 2016-09-14 MED ORDER — TOPIRAMATE 50 MG PO TABS: ORAL_TABLET | ORAL | 0 refills | Status: DC

## 2016-09-14 NOTE — Progress Notes (Signed)
Chart forwarded.  

## 2016-09-14 NOTE — Patient Instructions (Addendum)
1.  We will start topiramate (Topamax) 50mg  tablets:  Take 1 tablet at bedtime for 7 days  Then 2 tablets at bedtime for 7 days  Then 3 tablets at bedtime for 7 days  Then 4 tablets at bedtime.  Contact me and we can change dose to 100mg  tablets.  Possible side effects include: impaired thinking, sedation, paresthesias (numbness and tingling) and weight loss.  It may cause dehydration and there is a small risk for kidney stones, so make sure to stay hydrated with water during the day.  There is also a very small risk for glaucoma, so if you notice any change in your vision while taking this medication, see an ophthalmologist.   I RECOMMEND TAKING FOLIC ACID 1MG  DAILY WHILE ON TOPIRAMATE.  DO NOT GET PREGNANT WHILE ON TOPAMAX. 2.  At earliest onset of migraine, take sumatriptan 100mg .  May repeat dose once after 2 hours if needed.  Do not exceed 2 tablets in 24 hours. 3.  Stop caffeine and only drink cup when you have a migraine. 4.  To help break current constant headache, I will prescribe your a prednisone 10mg  tablet taper:  Take 6tabs x1day, then 5tabs x1day, then 4tabs x1day, then 3tabs x1day, then 2tabs x1day, then 1tab x1day, then STOP 5.  Follow up in 3 months.   Migraine Headache A migraine headache is an intense, throbbing pain on one or both sides of your head. A migraine can last for 30 minutes to several hours. CAUSES  The exact cause of a migraine headache is not always known. However, a migraine may be caused when nerves in the brain become irritated and release chemicals that cause inflammation. This causes pain. Certain things may also trigger migraines, such as:  Alcohol.  Smoking.  Stress.  Menstruation.  Aged cheeses.  Foods or drinks that contain nitrates, glutamate, aspartame, or tyramine.  Lack of sleep.  Chocolate.  Caffeine.  Hunger.  Physical exertion.  Fatigue.  Medicines used to treat chest pain (nitroglycerine), birth control pills, estrogen,  and some blood pressure medicines. SIGNS AND SYMPTOMS  Pain on one or both sides of your head.  Pulsating or throbbing pain.  Severe pain that prevents daily activities.  Pain that is aggravated by any physical activity.  Nausea, vomiting, or both.  Dizziness.  Pain with exposure to bright lights, loud noises, or activity.  General sensitivity to bright lights, loud noises, or smells. Before you get a migraine, you may get warning signs that a migraine is coming (aura). An aura may include:  Seeing flashing lights.  Seeing bright spots, halos, or zigzag lines.  Having tunnel vision or blurred vision.  Having feelings of numbness or tingling.  Having trouble talking.  Having muscle weakness. DIAGNOSIS  A migraine headache is often diagnosed based on:  Symptoms.  Physical exam.  A CT scan or MRI of your head. These imaging tests cannot diagnose migraines, but they can help rule out other causes of headaches. TREATMENT Medicines may be given for pain and nausea. Medicines can also be given to help prevent recurrent migraines.  HOME CARE INSTRUCTIONS  Only take over-the-counter or prescription medicines for pain or discomfort as directed by your health care provider. The use of long-term narcotics is not recommended.  Lie down in a dark, quiet room when you have a migraine.  Keep a journal to find out what may trigger your migraine headaches. For example, write down:  What you eat and drink.  How much sleep you  get.  Any change to your diet or medicines.  Limit alcohol consumption.  Quit smoking if you smoke.  Get 7-9 hours of sleep, or as recommended by your health care provider.  Limit stress.  Keep lights dim if bright lights bother you and make your migraines worse. SEEK IMMEDIATE MEDICAL CARE IF:   Your migraine becomes severe.  You have a fever.  You have a stiff neck.  You have vision loss.  You have muscular weakness or loss of muscle  control.  You start losing your balance or have trouble walking.  You feel faint or pass out.  You have severe symptoms that are different from your first symptoms. MAKE SURE YOU:   Understand these instructions.  Will watch your condition.  Will get help right away if you are not doing well or get worse.   This information is not intended to replace advice given to you by your health care provider. Make sure you discuss any questions you have with your health care provider.   Document Released: 10/25/2005 Document Revised: 11/15/2014 Document Reviewed: 07/02/2013 Elsevier Interactive Patient Education [employer] Inc[employer]
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
## 2016-10-19 ENCOUNTER — Other Ambulatory Visit: Payer: Self-pay | Admitting: Neurology

## 2017-01-03 ENCOUNTER — Ambulatory Visit (INDEPENDENT_AMBULATORY_CARE_PROVIDER_SITE_OTHER): Payer: BLUE CROSS/BLUE SHIELD | Admitting: Neurology

## 2017-01-03 ENCOUNTER — Encounter: Payer: Self-pay | Admitting: Neurology

## 2017-01-03 VITALS — BP 120/64 | HR 65 | Ht 62.0 in | Wt 176.0 lb

## 2017-01-03 DIAGNOSIS — Z720 Tobacco use: Secondary | ICD-10-CM | POA: Diagnosis not present

## 2017-01-03 DIAGNOSIS — G43109 Migraine with aura, not intractable, without status migrainosus: Secondary | ICD-10-CM | POA: Diagnosis not present

## 2017-01-03 MED ORDER — TOPIRAMATE 100 MG PO TABS: 200.0000 mg | ORAL_TABLET | Freq: Every day | ORAL | 3 refills | Status: DC

## 2017-01-03 NOTE — Patient Instructions (Signed)
1.  Continue topiramate 200mg  at bedtime 2.  Sumatriptan as needed 3.  Follow up in 6 months.

## 2017-01-03 NOTE — Progress Notes (Signed)
Note sent to Dr. Shah.  
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
## 2017-06-08 ENCOUNTER — Ambulatory Visit (INDEPENDENT_AMBULATORY_CARE_PROVIDER_SITE_OTHER): Payer: BLUE CROSS/BLUE SHIELD | Admitting: Neurology

## 2017-06-08 ENCOUNTER — Encounter: Payer: Self-pay | Admitting: Neurology

## 2017-06-08 VITALS — BP 104/72 | HR 76 | Ht 63.0 in | Wt 168.3 lb

## 2017-06-08 DIAGNOSIS — Z720 Tobacco use: Secondary | ICD-10-CM

## 2017-06-08 DIAGNOSIS — G43109 Migraine with aura, not intractable, without status migrainosus: Secondary | ICD-10-CM | POA: Diagnosis not present

## 2017-06-08 NOTE — Patient Instructions (Signed)
1.  I think you need to have the Nexplanon removed 2.  Continue topamax.  Take folic acid 1mg  daily 3.  Follow up in 6 months.
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
## 2017-06-28 ENCOUNTER — Ambulatory Visit (INDEPENDENT_AMBULATORY_CARE_PROVIDER_SITE_OTHER): Payer: BLUE CROSS/BLUE SHIELD | Admitting: Adult Health

## 2017-06-28 ENCOUNTER — Encounter: Payer: Self-pay | Admitting: Adult Health

## 2017-06-28 VITALS — BP 98/74 | HR 79 | Ht 62.5 in | Wt 165.5 lb

## 2017-06-28 DIAGNOSIS — Z3046 Encounter for surveillance of implantable subdermal contraceptive: Secondary | ICD-10-CM | POA: Diagnosis not present

## 2017-06-28 NOTE — Patient Instructions (Signed)
Use condoms x 2 weeks, keep clean and dry x 24 hours, no heavy lifting, keep steri strips on x 72 hours, Keep pressure dressing on x 24 hours. Follow up prn problems. F/u in 4 weeks for pap and physical

## 2017-06-28 NOTE — Progress Notes (Signed)
Subjective:     Patient ID: Katherine Villegas, female   DOB: May 21, 1991, 26 y.o.   MRN: [id number]  HPI Bona is a 26 year old white female in for nexplanon removal, has had headaches and irregular bleeding and is OK if gets pregnant, declines birht control.   Review of Systems For nexplanon removal +headaches irregular bleeding Reviewed past medical,surgical, social and family history. Reviewed medications and allergies.     Objective:   Physical Exam BP 98/74 (BP Location: Left Arm, Patient Position: Sitting, Cuff Size: Normal)   Pulse 79   Ht 5' 2.5" (1.588 m)   Wt 165 lb 8 oz (75.1 kg)   LMP 06/08/2017 (Exact Date)   Breastfeeding? No   BMI 29.79 kg/m  Consent signed, time out called. Left arm cleansed with betadine, and injected with 1.5 cc 1% lidocaine and waited til numb.Under sterile technique a #11 blade was used to make small vertical incision, and a curved forceps was used to easily remove rod. Steri strips applied. Pressure dressing applied.    Assessment:     Nexplanon removal    Plan:     Use condoms x 2 weeks, keep clean and dry x 24 hours, no heavy lifting, keep steri strips on x 72 hours, Keep pressure dressing on x 24 hours. Follow up prn problems. Pap and physical in 4 weeks

## 2017-07-04 ENCOUNTER — Ambulatory Visit: Payer: BLUE CROSS/BLUE SHIELD | Admitting: Neurology

## 2017-07-08 ENCOUNTER — Ambulatory Visit: Payer: BLUE CROSS/BLUE SHIELD | Admitting: Neurology

## 2017-08-02 ENCOUNTER — Ambulatory Visit (INDEPENDENT_AMBULATORY_CARE_PROVIDER_SITE_OTHER): Payer: BLUE CROSS/BLUE SHIELD | Admitting: Adult Health

## 2017-08-02 ENCOUNTER — Encounter: Payer: Self-pay | Admitting: Adult Health

## 2017-08-02 ENCOUNTER — Other Ambulatory Visit (HOSPITAL_COMMUNITY)
Admission: RE | Admit: 2017-08-02 | Discharge: 2017-08-02 | Disposition: A | Discharge status: Home / Self Care | Payer: BLUE CROSS/BLUE SHIELD | Source: Ambulatory Visit | Attending: Adult Health | Admitting: Adult Health

## 2017-08-02 VITALS — BP 110/80 | HR 96 | Ht 63.0 in | Wt 164.0 lb

## 2017-08-02 DIAGNOSIS — Z3202 Encounter for pregnancy test, result negative: Secondary | ICD-10-CM | POA: Diagnosis not present

## 2017-08-02 DIAGNOSIS — Z01419 Encounter for gynecological examination (general) (routine) without abnormal findings: Secondary | ICD-10-CM | POA: Diagnosis not present

## 2017-08-02 LAB — POCT URINE PREGNANCY: PREG TEST UR: NEGATIVE

## 2017-08-02 NOTE — Patient Instructions (Signed)
Physical in 1 year Pap in 3 if normal 

## 2017-08-02 NOTE — Progress Notes (Signed)
Patient ID: Katherine Villegas, female   DOB: [birth year], 26 y.o.   MRN: [id number] History of Present Illness: Katherine Villegas is a 26 year old white female, in for a well woman gyn exam and pap.Her period is late, and she had nexplanon removed in August.   Current Medications, Allergies, Past Medical History, Past Surgical History, Family History and Social History were reviewed in [employer] record.     Review of Systems: Patient denies any headaches, hearing loss, fatigue, blurred vision, shortness of breath, chest pain, abdominal pain, problems with bowel movements, urination, or intercourse. No joint pain or mood swings.Period late.    Physical Exam:BP 110/80 (BP Location: Left Arm, Patient Position: Sitting, Cuff Size: Normal)   Pulse 96   Ht  (1.6 m)   Wt 164 lb (74.4 kg)   LMP 07/01/2017   Breastfeeding? No   BMI 29.05 kg/m UPT negative. General:  Well developed, well nourished, no acute distress Skin:  Warm and dry Neck:  Midline trachea, normal thyroid, good ROM, no lymphadenopathy Lungs; Clear to auscultation bilaterally Breast:  No dominant palpable mass, retraction, or nipple discharge Cardiovascular: Regular rate and rhythm Abdomen:  Soft, non tender, no hepatosplenomegaly Pelvic:  External genitalia is normal in appearance, no lesions.  The vagina is normal in appearance. Urethra has no lesions or masses. The cervix is bulbous.Pap with GC/CHL performed.  Uterus is felt to be normal size, shape, and contour.  No adnexal masses or tenderness noted.Bladder is non tender, no masses felt. Extremities/musculoskeletal:  No swelling or varicosities noted, no clubbing or cyanosis Psych:  No mood changes, alert and cooperative,seems happy PHQ 2 score 0. Discussed periods could be 21-35 day cycles, if no period in a week check HPT.   Impression: 1. Encounter for gynecological examination with Papanicolaou smear of cervix   2. Pregnancy examination or test,  negative result       Plan: Physical in 1 year Pap in 3 if normal

## 2017-08-05 LAB — CYTOLOGY - PAP
Chlamydia: NEGATIVE
Diagnosis: NEGATIVE
Neisseria Gonorrhea: NEGATIVE

## 2017-08-23 ENCOUNTER — Encounter: Payer: Self-pay | Admitting: Women's Health

## 2017-08-23 ENCOUNTER — Ambulatory Visit (INDEPENDENT_AMBULATORY_CARE_PROVIDER_SITE_OTHER): Payer: BLUE CROSS/BLUE SHIELD | Admitting: Women's Health

## 2017-08-23 VITALS — BP 102/64 | HR 101 | Ht 62.5 in | Wt 166.0 lb

## 2017-08-23 DIAGNOSIS — M545 Low back pain, unspecified: Secondary | ICD-10-CM

## 2017-08-23 DIAGNOSIS — N898 Other specified noninflammatory disorders of vagina: Secondary | ICD-10-CM

## 2017-08-23 DIAGNOSIS — R309 Painful micturition, unspecified: Secondary | ICD-10-CM

## 2017-08-23 DIAGNOSIS — R350 Frequency of micturition: Secondary | ICD-10-CM | POA: Diagnosis not present

## 2017-08-23 LAB — POCT URINALYSIS DIPSTICK
GLUCOSE UA: NEGATIVE
Ketones, UA: NEGATIVE
LEUKOCYTES UA: NEGATIVE
NITRITE UA: NEGATIVE
Protein, UA: NEGATIVE
RBC UA: NEGATIVE

## 2017-08-23 NOTE — Progress Notes (Signed)
GYN VISIT Patient name: Katherine Villegas MRN [id number]  Date of birth: Apr 01, 1991 Chief Complaint:   Urinary Frequency (pain with urination; back pain )  History of Present Illness:   Katherine Villegas is a 26 y.o. [phone] Caucasian female being seen today for report of dysuria and back pain. Reports she has had some discomfort with urinating but also feels some vaginal irritation with urination as well as urinary urgency for past week or so. Denies urinary frequency, hesitancy, hematuria. Also some dull achy pain lower back for same duration. Has tried AZO cranberry which has helped some.  Denies fever/chills. Denies abnormal discharge, itching/odor/irritation. Just had neg gc/ct 3wks ago. In monogamous relationship.  Is also 'not preventing' pregnancy. Is taking pnv daily.   Patient's last menstrual period was 08/08/2017. The current method of family planning is none. Last pap 08/02/17. Results were:  normal Review of Systems:   Denies any headaches, blurred vision, fatigue, shortness of breath, chest pain, abdominal pain, abnormal vaginal discharge/itching/odor/irritation, problems with periods, bowel movements, or intercourse unless otherwise stated above.  Pertinent History Reviewed:  Reviewed past medical,surgical, social and family history.  Reviewed problem list, medications and allergies. Physical Assessment:   Vitals:   08/23/17 0929  BP: 102/64  Pulse: (!) 101  Weight: 166 lb (75.3 kg)  Height: 5' 2.5" (1.588 m)  Body mass index is 29.88 kg/m.       Physical Examination:   General appearance: alert, well appearing, and in no distress  Mental status: alert, oriented to person, place, and time  Skin: warm & dry   Cardiovascular: normal heart rate noted   Respiratory: normal respiratory effort, no distress   Abdomen: gravid, soft, non-tender    Pelvic: VULVA: normal appearing vulva with no masses, tenderness or lesions, VAGINA: normal appearing vagina with normal color and  discharge, no lesions, CERVIX: normal appearing cervix without discharge or lesions  Extremities:  no pedal edema noted  Results for orders placed or performed in visit on 08/23/17 (from the past 24 hour(s))  POCT urinalysis dipstick   Collection Time: 08/23/17  9:28 AM  Result Value Ref Range   Color, UA     Clarity, UA     Glucose, UA neg    Bilirubin, UA     Ketones, UA neg    Spec Grav, UA  1.010 - 1.025   Blood, UA neg    pH, UA  5.0 - 8.0   Protein, UA neg    Urobilinogen, UA  0.2 or 1.0 E.U./dL   Nitrite, UA neg    Leukocytes, UA Negative Negative  POCT Wet Prep [facility])   Collection Time: 08/24/17  8:39 AM  Result Value Ref Range   Source Wet Prep POC vaginal    WBC, Wet Prep HPF POC none    Bacteria Wet Prep HPF POC None (A) Few   BACTERIA WET PREP MORPHOLOGY POC     Clue Cells Wet Prep HPF POC None None   Clue Cells Wet Prep Whiff POC Negative Whiff    Yeast Wet Prep HPF POC None    KOH Wet Prep POC     Trichomonas Wet Prep HPF POC Absent Absent    Assessment & Plan:  1) Dysuria and urinary urgency> urine dipstick neg, will send urine cx. Push po water, continue AZO cranberry if helping, let us know if sx worsen  2) Low back pain> can try ibuprofen, heating pad  3) Vaginal irritation> exam normal, wet prep neg  4) Not preventing pregnancy> continue pnv, let us know when pregnant  Orders Placed This Encounter  Procedures  . Urine Culture  . POCT urinalysis dipstick  . POCT Wet Prep [facility])    Return for prn.  Marge Duncans CNM, [facility] 08/23/17

## 2017-08-24 ENCOUNTER — Encounter: Payer: Self-pay | Admitting: Women's Health

## 2017-08-24 LAB — POCT WET PREP (WET MOUNT)
CLUE CELLS WET PREP WHIFF POC: NEGATIVE
Trichomonas Wet Prep HPF POC: ABSENT

## 2017-08-25 ENCOUNTER — Telehealth: Payer: Self-pay | Admitting: Women's Health

## 2017-08-25 ENCOUNTER — Telehealth: Payer: Self-pay | Admitting: *Deleted

## 2017-08-25 LAB — URINE CULTURE: Organism ID, Bacteria: NO GROWTH

## 2017-08-25 NOTE — Telephone Encounter (Signed)
LM for pt to return call. Urine culture neg.  Cheral MarkerKimberly R. Squire Withey, CNM, WHNP-BC 08/25/2017 1:20 PM

## 2017-08-26 NOTE — Telephone Encounter (Signed)
Called pt, notified of neg urine cx, states sx have completely resolved.  Cheral MarkerKimberly R. Cliffard Hair, CNM, WHNP-BC 08/26/2017 1:40 PM

## 2017-11-08 NOTE — L&D Delivery Note (Signed)
  Patient admitted for SROM. Epidural given and pitocin started. Patient had quick progress to C/C, and excellent pushing effort.   Delivery Note At 12:15 PM a viable female was delivered via  (Presentation: ;  ).  APGAR: 8, 9; weight  .   Placenta status:complete , intact .  Cord: 3vc with the following complications: None .  Cord pH: na  Anesthesia:  Epidural  Episiotomy:  none Lacerations:  2nd degree Suture Repair: 3.0 monocryl  Est. Blood Loss (mL):  164  Mom to postpartum.  Baby to Couplet care / Skin to Skin.  Thressa Sheller 08/05/2018, 12:42 PM

## 2017-12-04 ENCOUNTER — Other Ambulatory Visit: Payer: Self-pay | Admitting: Neurology

## 2017-12-09 ENCOUNTER — Encounter: Payer: Self-pay | Admitting: Neurology

## 2017-12-09 ENCOUNTER — Ambulatory Visit (INDEPENDENT_AMBULATORY_CARE_PROVIDER_SITE_OTHER): Payer: PRIVATE HEALTH INSURANCE | Admitting: Neurology

## 2017-12-09 VITALS — BP 98/70 | HR 103 | Ht 62.0 in | Wt 172.0 lb

## 2017-12-09 DIAGNOSIS — Z720 Tobacco use: Secondary | ICD-10-CM

## 2017-12-09 DIAGNOSIS — G43009 Migraine without aura, not intractable, without status migrainosus: Secondary | ICD-10-CM

## 2017-12-09 MED ORDER — TOPIRAMATE 100 MG PO TABS: 200.0000 mg | ORAL_TABLET | Freq: Every day | ORAL | 3 refills | Status: DC

## 2017-12-09 NOTE — Patient Instructions (Signed)
1.  Continue topiramate 200mg  at bedtime. 2.  Use sumatriptan as needed/directed 3.  Try to quit smoking 4.  Follow the sleep hygiene sheet 5.  Follow up in 6 months.
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
## 2017-12-23 ENCOUNTER — Ambulatory Visit (INDEPENDENT_AMBULATORY_CARE_PROVIDER_SITE_OTHER): Payer: PRIVATE HEALTH INSURANCE | Admitting: Adult Health

## 2017-12-23 ENCOUNTER — Encounter: Payer: Self-pay | Admitting: Adult Health

## 2017-12-23 VITALS — BP 90/62 | HR 78 | Ht 62.5 in | Wt 176.0 lb

## 2017-12-23 DIAGNOSIS — Z3201 Encounter for pregnancy test, result positive: Secondary | ICD-10-CM | POA: Insufficient documentation

## 2017-12-23 DIAGNOSIS — N926 Irregular menstruation, unspecified: Secondary | ICD-10-CM

## 2017-12-23 DIAGNOSIS — R112 Nausea with vomiting, unspecified: Secondary | ICD-10-CM

## 2017-12-23 DIAGNOSIS — O219 Vomiting of pregnancy, unspecified: Secondary | ICD-10-CM

## 2017-12-23 DIAGNOSIS — R143 Flatulence: Secondary | ICD-10-CM

## 2017-12-23 DIAGNOSIS — O3680X0 Pregnancy with inconclusive fetal viability, not applicable or unspecified: Secondary | ICD-10-CM

## 2017-12-23 DIAGNOSIS — Z3A01 Less than 8 weeks gestation of pregnancy: Secondary | ICD-10-CM | POA: Insufficient documentation

## 2017-12-23 LAB — POCT URINE PREGNANCY: PREG TEST UR: POSITIVE — AB

## 2017-12-23 MED ORDER — DOXYLAMINE-PYRIDOXINE ER 20-20 MG PO TBCR: 1.0000 | EXTENDED_RELEASE_TABLET | Freq: Two times a day (BID) | ORAL | 0 refills | Status: DC

## 2017-12-23 NOTE — Progress Notes (Signed)
Subjective:     Patient ID: Katherine Villegas, female   DOB: 29-Sep-1991, 27 y.o.   MRN: [id number]  HPI [name] is a 27 year old white female, married in for UPT, has missed a period and had 2+HPTs and has nausea and vomiting and gas.She has 6719 month old at home.   Review of Systems +missed period with 2+HPTs +nausea, vomiting and gas Denies any bleeding or cramping  Reviewed past medical,surgical, social and family history. Reviewed medications and allergies.     Objective:   Physical Exam BP 90/62 (BP Location: Left Arm, Patient Position: Sitting, Cuff Size: Normal)   Pulse 78   Ht 5' 2.5" (1.588 m)   Wt 176 lb (79.8 kg)   LMP 11/16/2017   Breastfeeding? No   BMI 31.68 kg/m UPT +, about 5+2 weeks by LMP with EDD 08/23/18.Skin warm and dry. Neck: mid line trachea, normal thyroid, good ROM, no lymphadenopathy noted. Lungs: clear to ausculation bilaterally. Cardiovascular: regular rate and rhythm. Abdomen is soft and non tender.    Assessment:     1. Pregnancy examination or test, positive result   2. Less than [redacted] weeks gestation of pregnancy   3. Encounter to determine fetal viability of pregnancy, single or unspecified fetus       Plan:    Continue PN Gummies  Return in 2 weeks for dating US Review handout on First trimester and by [facility] Eat often    Given #18 bonjesta to try 1 bid, lot [id number] exp 05/07/18

## 2017-12-23 NOTE — Patient Instructions (Signed)
First Trimester of Pregnancy The first trimester of pregnancy is from week 1 until the end of week 13 (months 1 through 3). A week after a sperm fertilizes an egg, the egg will implant on the wall of the uterus. This embryo will begin to develop into a baby. Genes from you and your partner will form the baby. The female genes will determine whether the baby will be a boy or a girl. At 6-8 weeks, the eyes and face will be formed, and the heartbeat can be seen on ultrasound. At the end of 12 weeks, all the baby's organs will be formed. Now that you are pregnant, you will want to do everything you can to have a healthy baby. Two of the most important things are to get good prenatal care and to follow your health care provider's instructions. Prenatal care is all the medical care you receive before the baby's birth. This care will help prevent, find, and treat any problems during the pregnancy and childbirth. Body changes during your first trimester Your body goes through many changes during pregnancy. The changes vary from woman to woman.  You may gain or lose a couple of pounds at first.  You may feel sick to your stomach (nauseous) and you may throw up (vomit). If the vomiting is uncontrollable, call your health care provider.  You may tire easily.  You may develop headaches that can be relieved by medicines. All medicines should be approved by your health care provider.  You may urinate more often. Painful urination may mean you have a bladder infection.  You may develop heartburn as a result of your pregnancy.  You may develop constipation because certain hormones are causing the muscles that push stool through your intestines to slow down.  You may develop hemorrhoids or swollen veins (varicose veins).  Your breasts may begin to grow larger and become tender. Your nipples may stick out more, and the tissue that surrounds them (areola) may become darker.  Your gums may bleed and may be  sensitive to brushing and flossing.  Dark spots or blotches (chloasma, mask of pregnancy) may develop on your face. This will likely fade after the baby is born.  Your menstrual periods will stop.  You may have a loss of appetite.  You may develop cravings for certain kinds of food.  You may have changes in your emotions from day to day, such as being excited to be pregnant or being concerned that something may go wrong with the pregnancy and baby.  You may have more vivid and strange dreams.  You may have changes in your hair. These can include thickening of your hair, rapid growth, and changes in texture. Some women also have hair loss during or after pregnancy, or hair that feels dry or thin. Your hair will most likely return to normal after your baby is born.  What to expect at prenatal visits During a routine prenatal visit:  You will be weighed to make sure you and the baby are growing normally.  Your blood pressure will be taken.  Your abdomen will be measured to track your baby's growth.  The fetal heartbeat will be listened to between weeks 10 and 14 of your pregnancy.  Test results from any previous visits will be discussed.  Your health care provider may ask you:  How you are feeling.  If you are feeling the baby move.  If you have had any abnormal symptoms, such as leaking fluid, bleeding, severe headaches,   or abdominal cramping.  If you are using any tobacco products, including cigarettes, chewing tobacco, and electronic cigarettes.  If you have any questions.  Other tests that may be performed during your first trimester include:  Blood tests to find your blood type and to check for the presence of any previous infections. The tests will also be used to check for low iron levels (anemia) and protein on red blood cells (Rh antibodies). Depending on your risk factors, or if you previously had diabetes during pregnancy, you may have tests to check for high blood  sugar that affects pregnant women (gestational diabetes).  Urine tests to check for infections, diabetes, or protein in the urine.  An ultrasound to confirm the proper growth and development of the baby.  Fetal screens for spinal cord problems (spina bifida) and Down syndrome.  HIV (human immunodeficiency virus) testing. Routine prenatal testing includes screening for HIV, unless you choose not to have this test.  You may need other tests to make sure you and the baby are doing well.  Follow these instructions at home: Medicines  Follow your health care provider's instructions regarding medicine use. Specific medicines may be either safe or unsafe to take during pregnancy.  Take a prenatal vitamin that contains at least 600 micrograms (mcg) of folic acid.  If you develop constipation, try taking a stool softener if your health care provider approves. Eating and drinking  Eat a balanced diet that includes fresh fruits and vegetables, whole grains, good sources of protein such as meat, eggs, or tofu, and low-fat dairy. Your health care provider will help you determine the amount of weight gain that is right for you.  Avoid raw meat and uncooked cheese. These carry germs that can cause birth defects in the baby.  Eating four or five small meals rather than three large meals a day may help relieve nausea and vomiting. If you start to feel nauseous, eating a few soda crackers can be helpful. Drinking liquids between meals, instead of during meals, also seems to help ease nausea and vomiting.  Limit foods that are high in fat and processed sugars, such as fried and sweet foods.  To prevent constipation: ? Eat foods that are high in fiber, such as fresh fruits and vegetables, whole grains, and beans. ? Drink enough fluid to keep your urine clear or pale yellow. Activity  Exercise only as directed by your health care provider. Most women can continue their usual exercise routine during  pregnancy. Try to exercise for 30 minutes at least 5 days a week. Exercising will help you: ? Control your weight. ? Stay in shape. ? Be prepared for labor and delivery.  Experiencing pain or cramping in the lower abdomen or lower back is a good sign that you should stop exercising. Check with your health care provider before continuing with normal exercises.  Try to avoid standing for long periods of time. Move your legs often if you must stand in one place for a long time.  Avoid heavy lifting.  Wear low-heeled shoes and practice good posture.  You may continue to have sex unless your health care provider tells you not to. Relieving pain and discomfort  Wear a good support bra to relieve breast tenderness.  Take warm sitz baths to soothe any pain or discomfort caused by hemorrhoids. Use hemorrhoid cream if your health care provider approves.  Rest with your legs elevated if you have leg cramps or low back pain.  If you develop   varicose veins in your legs, wear support hose. Elevate your feet for 15 minutes, 3-4 times a day. Limit salt in your diet. Prenatal care  Schedule your prenatal visits by the twelfth week of pregnancy. They are usually scheduled monthly at first, then more often in the last 2 months before delivery.  Write down your questions. Take them to your prenatal visits.  Keep all your prenatal visits as told by your health care provider. This is important. Safety  Wear your seat belt at all times when driving.  Make a list of emergency phone numbers, including numbers for family, friends, the hospital, and police and fire departments. General instructions  Ask your health care provider for a referral to a local prenatal education class. Begin classes no later than the beginning of month 6 of your pregnancy.  Ask for help if you have counseling or nutritional needs during pregnancy. Your health care provider can offer advice or refer you to specialists for help  with various needs.  Do not use hot tubs, steam rooms, or saunas.  Do not douche or use tampons or scented sanitary pads.  Do not cross your legs for long periods of time.  Avoid cat litter boxes and soil used by cats. These carry germs that can cause birth defects in the baby and possibly loss of the fetus by miscarriage or stillbirth.  Avoid all smoking, herbs, alcohol, and medicines not prescribed by your health care provider. Chemicals in these products affect the formation and growth of the baby.  Do not use any products that contain nicotine or tobacco, such as cigarettes and e-cigarettes. If you need help quitting, ask your health care provider. You may receive counseling support and other resources to help you quit.  Schedule a dentist appointment. At home, brush your teeth with a soft toothbrush and be gentle when you floss. Contact a health care provider if:  You have dizziness.  You have mild pelvic cramps, pelvic pressure, or nagging pain in the abdominal area.  You have persistent nausea, vomiting, or diarrhea.  You have a bad smelling vaginal discharge.  You have pain when you urinate.  You notice increased swelling in your face, hands, legs, or ankles.  You are exposed to fifth disease or chickenpox.  You are exposed to German measles (rubella) and have never had it. Get help right away if:  You have a fever.  You are leaking fluid from your vagina.  You have spotting or bleeding from your vagina.  You have severe abdominal cramping or pain.  You have rapid weight gain or loss.  You vomit blood or material that looks like coffee grounds.  You develop a severe headache.  You have shortness of breath.  You have any kind of trauma, such as from a fall or a car accident. Summary  The first trimester of pregnancy is from week 1 until the end of week 13 (months 1 through 3).  Your body goes through many changes during pregnancy. The changes vary from  woman to woman.  You will have routine prenatal visits. During those visits, your health care provider will examine you, discuss any test results you may have, and talk with you about how you are feeling. This information is not intended to replace advice given to you by your health care provider. Make sure you discuss any questions you have with your health care provider. Document Released: 10/19/2001 Document Revised: 10/06/2016 Document Reviewed: 10/06/2016 Elsevier Interactive Patient Education  2018 Elsevier   Inc[employer]  

## 2018-01-04 ENCOUNTER — Other Ambulatory Visit: Payer: Self-pay | Admitting: Obstetrics & Gynecology

## 2018-01-04 DIAGNOSIS — O3680X0 Pregnancy with inconclusive fetal viability, not applicable or unspecified: Secondary | ICD-10-CM

## 2018-01-06 ENCOUNTER — Ambulatory Visit (INDEPENDENT_AMBULATORY_CARE_PROVIDER_SITE_OTHER): Payer: PRIVATE HEALTH INSURANCE

## 2018-01-06 DIAGNOSIS — Z3A01 Less than 8 weeks gestation of pregnancy: Secondary | ICD-10-CM | POA: Diagnosis not present

## 2018-01-06 DIAGNOSIS — O3680X0 Pregnancy with inconclusive fetal viability, not applicable or unspecified: Secondary | ICD-10-CM

## 2018-01-06 NOTE — Progress Notes (Signed)
US 7+2 wks,single IUP w/ys,positive fht 153 bpm,normal ovaries bilat,crl 13.39 mm,EDD 08/23/2018 by LMP

## 2018-01-20 ENCOUNTER — Ambulatory Visit (INDEPENDENT_AMBULATORY_CARE_PROVIDER_SITE_OTHER): Payer: PRIVATE HEALTH INSURANCE | Admitting: Women's Health

## 2018-01-20 ENCOUNTER — Encounter: Payer: Self-pay | Admitting: Women's Health

## 2018-01-20 ENCOUNTER — Ambulatory Visit: Payer: PRIVATE HEALTH INSURANCE | Admitting: *Deleted

## 2018-01-20 VITALS — BP 88/62 | HR 95 | Wt 183.0 lb

## 2018-01-20 DIAGNOSIS — Z1389 Encounter for screening for other disorder: Secondary | ICD-10-CM

## 2018-01-20 DIAGNOSIS — Z3481 Encounter for supervision of other normal pregnancy, first trimester: Secondary | ICD-10-CM

## 2018-01-20 DIAGNOSIS — O09899 Supervision of other high risk pregnancies, unspecified trimester: Secondary | ICD-10-CM

## 2018-01-20 DIAGNOSIS — O09211 Supervision of pregnancy with history of pre-term labor, first trimester: Secondary | ICD-10-CM

## 2018-01-20 DIAGNOSIS — O34219 Maternal care for unspecified type scar from previous cesarean delivery: Secondary | ICD-10-CM

## 2018-01-20 DIAGNOSIS — Z3490 Encounter for supervision of normal pregnancy, unspecified, unspecified trimester: Secondary | ICD-10-CM | POA: Insufficient documentation

## 2018-01-20 DIAGNOSIS — O09219 Supervision of pregnancy with history of pre-term labor, unspecified trimester: Secondary | ICD-10-CM

## 2018-01-20 DIAGNOSIS — Z331 Pregnant state, incidental: Secondary | ICD-10-CM

## 2018-01-20 DIAGNOSIS — Z3A09 9 weeks gestation of pregnancy: Secondary | ICD-10-CM | POA: Diagnosis not present

## 2018-01-20 DIAGNOSIS — O34211 Maternal care for low transverse scar from previous cesarean delivery: Secondary | ICD-10-CM | POA: Diagnosis not present

## 2018-01-20 DIAGNOSIS — Z3682 Encounter for antenatal screening for nuchal translucency: Secondary | ICD-10-CM

## 2018-01-20 LAB — POCT URINALYSIS DIPSTICK
GLUCOSE UA: NEGATIVE
Ketones, UA: NEGATIVE
Leukocytes, UA: NEGATIVE
Nitrite, UA: NEGATIVE
Protein, UA: NEGATIVE
RBC UA: NEGATIVE

## 2018-01-20 NOTE — Progress Notes (Addendum)
INITIAL OBSTETRICAL VISIT Patient name: Katherine Villegas MRN [id number]  Date of birth: 04-Jan-1991 Chief Complaint:   Initial Prenatal Visit  History of Present Illness:   Katherine Villegas is a 27 y.o. [id number] Caucasian female at [redacted]w[redacted]d by LMP c/w 7wk u/s, with an Estimated Date of Delivery: 08/23/18 being seen today for her initial obstetrical visit.   Her obstetrical history is significant for h/o 32wk PPROM w/ C/S @ 34wks d/t breech, then successful term VBAC- was on Makena, SAB x 2.   Today she reports trouble sleeping.  Patient's last menstrual period was 11/16/2017. Last pap 08/02/17. Results were: normal Review of Systems:   Pertinent items are noted in HPI Denies cramping/contractions, leakage of fluid, vaginal bleeding, abnormal vaginal discharge w/ itching/odor/irritation, headaches, visual changes, shortness of breath, chest pain, abdominal pain, severe nausea/vomiting, or problems with urination or bowel movements unless otherwise stated above.  Pertinent History Reviewed:  Reviewed past medical,surgical, social, obstetrical and family history.  Reviewed problem list, medications and allergies. OB History  Gravida Para Term Preterm AB Living  5 2 1 1 2 2   SAB TAB Ectopic Multiple Live Births  2     0 2    # Outcome Date GA Lbr Len/2nd Weight Sex Delivery Anes PTL Lv  5 Current           4 Term 05/19/16 [redacted]w[redacted]d 25:00 / 00:37 7 lb 5.3 oz (3.325 kg) F VBAC EPI N LIV     Birth Comments: none  3 SAB 2015          2 SAB 2013          1 Preterm 08/23/10 [redacted]w[redacted]d  4 lb 9 oz (2.07 kg) F CS-Unspec Spinal Y LIV     Complications: Preterm premature rupture of membranes (PPROM) with unknown onset of labor,Breech presentation     Physical Assessment:   Vitals:   01/20/18 0930  BP: (!) 88/62  Pulse: 95  Weight: 183 lb (83 kg)  Body mass index is 32.94 kg/m.       Physical Examination:  General appearance - well appearing, and in no distress  Mental status - alert, oriented to person,  place, and time  Psych:  She has a normal mood and affect  Skin - warm and dry, normal color, no suspicious lesions noted  Chest - effort normal, all lung fields clear to auscultation bilaterally  Heart - normal rate and regular rhythm  Abdomen - soft, nontender  Extremities:  No swelling or varicosities noted  Thin prep pap is not done   Fetal Heart Rate (bpm): +u/s via informal transabdominal u/s  Results for orders placed or performed in visit on 01/20/18 (from the past 24 hour(s))  POCT urinalysis dipstick   Collection Time: 01/20/18  9:58 AM  Result Value Ref Range   Color, UA     Clarity, UA     Glucose, UA neg    Bilirubin, UA     Ketones, UA neg    Spec Grav, UA  1.010 - 1.025   Blood, UA neg    pH, UA  5.0 - 8.0   Protein, UA neg    Urobilinogen, UA  0.2 or 1.0 E.U./dL   Nitrite, UA neg    Leukocytes, UA Negative Negative   Appearance     Odor      Assessment & Plan:  1) Low-Risk Pregnancy [id number] at [redacted]w[redacted]d with an Estimated Date of Delivery: 08/23/18   2) Initial OB visit  3) H/O 32wk PPROM w/ 34wk PTB> offered Makena, declines right now  3) Prev C/S> for breech  4) H/O successful VBAC> wants VBAC  Meds: No orders of the defined types were placed in this encounter.   Initial labs obtained Continue prenatal vitamins Reviewed n/v relief measures and warning s/s to report Reviewed recommended weight gain based on pre-gravid BMI Encouraged well-balanced diet Genetic Screening discussed Integrated Screen: requested Cystic fibrosis screening discussed declined Ultrasound discussed; fetal survey: requested CCNC completed>PCM not here  Follow-up: Return in about 4 weeks (around 02/17/2018) for LROB, US:NT+1stIT.   Orders Placed This Encounter  Procedures  . GC/Chlamydia Probe Amp  . Urine Culture  . US Fetal Nuchal Translucency Measurement  . Obstetric Panel, Including HIV  . Urinalysis, Routine w reflex microscopic  . Pain Management Screening Profile  (10S)  . POCT urinalysis dipstick    Cheral MarkerKimberly R Orlander [place] CNM, [facility] HospitalWHNP-BC 01/20/2018 10:15 AM

## 2018-01-20 NOTE — Addendum Note (Signed)
Addended by: Shawna ClampBOOKER, Ryenne Lynam R on: 01/20/2018 10:15 AM   Modules accepted: Orders

## 2018-01-20 NOTE — Patient Instructions (Signed)
Katherine NoonKatelynn Villegas, I greatly value your feedback.  If you receive a survey following your visit with us today, we appreciate you taking the time to fill it out.  Thanks, Joellyn HaffKim Miraj Truss, CNM, WHNP-BC   Nausea & Vomiting  Have saltine crackers or pretzels by your bed and eat a few bites before you raise your head out of bed in the morning  Eat small frequent meals throughout the day instead of large meals  Drink plenty of fluids throughout the day to stay hydrated, just don't drink a lot of fluids with your meals.  This can make your stomach fill up faster making you feel sick  Do not brush your teeth right after you eat  Products with real ginger are good for nausea, like ginger ale and ginger hard candy Make sure it says made with real ginger!  Sucking on sour candy like lemon heads is also good for nausea  If your prenatal vitamins make you nauseated, take them at night so you will sleep through the nausea  Sea Bands  If you feel like you need medicine for the nausea & vomiting please let us know  If you are unable to keep any fluids or food down please let us know   Constipation  Drink plenty of fluid, preferably water, throughout the day  Eat foods high in fiber such as fruits, vegetables, and grains  Exercise, such as walking, is a good way to keep your bowels regular  Drink warm fluids, especially warm prune juice, or decaf coffee  Eat a 1/2 cup of real oatmeal (not instant), 1/2 cup applesauce, and 1/2-1 cup warm prune juice every day  If needed, you may take Colace (docusate sodium) stool softener once or twice a day to help keep the stool soft. If you are pregnant, wait until you are out of your first trimester (12-14 weeks of pregnancy)  If you still are having problems with constipation, you may take Miralax once daily as needed to help keep your bowels regular.  If you are pregnant, wait until you are out of your first trimester (12-14 weeks of pregnancy)   First  Trimester of Pregnancy The first trimester of pregnancy is from week 1 until the end of week 12 (months 1 through 3). A week after a sperm fertilizes an egg, the egg will implant on the wall of the uterus. This embryo will begin to develop into a baby. Genes from you and your partner are forming the baby. The female genes determine whether the baby is a boy or a girl. At 6-8 weeks, the eyes and face are formed, and the heartbeat can be seen on ultrasound. At the end of 12 weeks, all the baby's organs are formed.  Now that you are pregnant, you will want to do everything you can to have a healthy baby. Two of the most important things are to get good prenatal care and to follow your health care provider's instructions. Prenatal care is all the medical care you receive before the baby's birth. This care will help prevent, find, and treat any problems during the pregnancy and childbirth. BODY CHANGES Your body goes through many changes during pregnancy. The changes vary from woman to woman.   You may gain or lose a couple of pounds at first.  You may feel sick to your stomach (nauseous) and throw up (vomit). If the vomiting is uncontrollable, call your health care provider.  You may tire easily.  You may develop headaches that  can be relieved by medicines approved by your health care provider.  You may urinate more often. Painful urination may mean you have a bladder infection.  You may develop heartburn as a result of your pregnancy.  You may develop constipation because certain hormones are causing the muscles that push waste through your intestines to slow down.  You may develop hemorrhoids or swollen, bulging veins (varicose veins).  Your breasts may begin to grow larger and become tender. Your nipples may stick out more, and the tissue that surrounds them (areola) may become darker.  Your gums may bleed and may be sensitive to brushing and flossing.  Dark spots or blotches (chloasma, mask  of pregnancy) may develop on your face. This will likely fade after the baby is born.  Your menstrual periods will stop.  You may have a loss of appetite.  You may develop cravings for certain kinds of food.  You may have changes in your emotions from day to day, such as being excited to be pregnant or being concerned that something may go wrong with the pregnancy and baby.  You may have more vivid and strange dreams.  You may have changes in your hair. These can include thickening of your hair, rapid growth, and changes in texture. Some women also have hair loss during or after pregnancy, or hair that feels dry or thin. Your hair will most likely return to normal after your baby is born. WHAT TO EXPECT AT YOUR PRENATAL VISITS During a routine prenatal visit:  You will be weighed to make sure you and the baby are growing normally.  Your blood pressure will be taken.  Your abdomen will be measured to track your baby's growth.  The fetal heartbeat will be listened to starting around week 10 or 12 of your pregnancy.  Test results from any previous visits will be discussed. Your health care provider may ask you:  How you are feeling.  If you are feeling the baby move.  If you have had any abnormal symptoms, such as leaking fluid, bleeding, severe headaches, or abdominal cramping.  If you have any questions. Other tests that may be performed during your first trimester include:  Blood tests to find your blood type and to check for the presence of any previous infections. They will also be used to check for low iron levels (anemia) and Rh antibodies. Later in the pregnancy, blood tests for diabetes will be done along with other tests if problems develop.  Urine tests to check for infections, diabetes, or protein in the urine.  An ultrasound to confirm the proper growth and development of the baby.  An amniocentesis to check for possible genetic problems.  Fetal screens for spina  bifida and Down syndrome.  You may need other tests to make sure you and the baby are doing well. HOME CARE INSTRUCTIONS  Medicines  Follow your health care provider's instructions regarding medicine use. Specific medicines may be either safe or unsafe to take during pregnancy.  Take your prenatal vitamins as directed.  If you develop constipation, try taking a stool softener if your health care provider approves. Diet  Eat regular, well-balanced meals. Choose a variety of foods, such as meat or vegetable-based protein, fish, milk and low-fat dairy products, vegetables, fruits, and whole grain breads and cereals. Your health care provider will help you determine the amount of weight gain that is right for you.  Avoid raw meat and uncooked cheese. These carry germs that can cause  birth defects in the baby.  Eating four or five small meals rather than three large meals a day may help relieve nausea and vomiting. If you start to feel nauseous, eating a few soda crackers can be helpful. Drinking liquids between meals instead of during meals also seems to help nausea and vomiting.  If you develop constipation, eat more high-fiber foods, such as fresh vegetables or fruit and whole grains. Drink enough fluids to keep your urine clear or pale yellow. Activity and Exercise  Exercise only as directed by your health care provider. Exercising will help you:  Control your weight.  Stay in shape.  Be prepared for labor and delivery.  Experiencing pain or cramping in the lower abdomen or low back is a good sign that you should stop exercising. Check with your health care provider before continuing normal exercises.  Try to avoid standing for long periods of time. Move your legs often if you must stand in one place for a long time.  Avoid heavy lifting.  Wear low-heeled shoes, and practice good posture.  You may continue to have sex unless your health care provider directs you  otherwise. Relief of Pain or Discomfort  Wear a good support bra for breast tenderness.   Take warm sitz baths to soothe any pain or discomfort caused by hemorrhoids. Use hemorrhoid cream if your health care provider approves.   Rest with your legs elevated if you have leg cramps or low back pain.  If you develop varicose veins in your legs, wear support hose. Elevate your feet for 15 minutes, 3-4 times a day. Limit salt in your diet. Prenatal Care  Schedule your prenatal visits by the twelfth week of pregnancy. They are usually scheduled monthly at first, then more often in the last 2 months before delivery.  Write down your questions. Take them to your prenatal visits.  Keep all your prenatal visits as directed by your health care provider. Safety  Wear your seat belt at all times when driving.  Make a list of emergency phone numbers, including numbers for family, friends, the hospital, and police and fire departments. General Tips  Ask your health care provider for a referral to a local prenatal education class. Begin classes no later than at the beginning of month 6 of your pregnancy.  Ask for help if you have counseling or nutritional needs during pregnancy. Your health care provider can offer advice or refer you to specialists for help with various needs.  Do not use hot tubs, steam rooms, or saunas.  Do not douche or use tampons or scented sanitary pads.  Do not cross your legs for long periods of time.  Avoid cat litter boxes and soil used by cats. These carry germs that can cause birth defects in the baby and possibly loss of the fetus by miscarriage or stillbirth.  Avoid all smoking, herbs, alcohol, and medicines not prescribed by your health care provider. Chemicals in these affect the formation and growth of the baby.  Schedule a dentist appointment. At home, brush your teeth with a soft toothbrush and be gentle when you floss. SEEK MEDICAL CARE IF:   You have  dizziness.  You have mild pelvic cramps, pelvic pressure, or nagging pain in the abdominal area.  You have persistent nausea, vomiting, or diarrhea.  You have a bad smelling vaginal discharge.  You have pain with urination.  You notice increased swelling in your face, hands, legs, or ankles. SEEK IMMEDIATE MEDICAL CARE IF:  You have a fever.  You are leaking fluid from your vagina.  You have spotting or bleeding from your vagina.  You have severe abdominal cramping or pain.  You have rapid weight gain or loss.  You vomit blood or material that looks like coffee grounds.  You are exposed to [place] measles and have never had them.  You are exposed to fifth disease or chickenpox.  You develop a severe headache.  You have shortness of breath.  You have any kind of trauma, such as from a fall or a car accident. Document Released: 10/19/2001 Document Revised: 03/11/2014 Document Reviewed: 09/04/2013 [facility] Patient Information 2015 [place], [place]. This information is not intended to replace advice given to you by your health care provider. Make sure you discuss any questions you have with your health care provider.

## 2018-01-21 LAB — OBSTETRIC PANEL, INCLUDING HIV
ANTIBODY SCREEN: NEGATIVE
BASOS: 0 %
Basophils Absolute: 0 10*3/uL (ref 0.0–0.2)
EOS (ABSOLUTE): 0.2 10*3/uL (ref 0.0–0.4)
Eos: 2 %
HEMATOCRIT: 36.4 % (ref 34.0–46.6)
HEMOGLOBIN: 12.5 g/dL (ref 11.1–15.9)
HIV Screen 4th Generation wRfx: NONREACTIVE
Hepatitis B Surface Ag: NEGATIVE
IMMATURE GRANS (ABS): 0 10*3/uL (ref 0.0–0.1)
IMMATURE GRANULOCYTES: 0 %
LYMPHS: 28 %
Lymphocytes Absolute: 2.5 10*3/uL (ref 0.7–3.1)
MCH: 29.8 pg (ref 26.6–33.0)
MCHC: 34.3 g/dL (ref 31.5–35.7)
MCV: 87 fL (ref 79–97)
MONOCYTES: 6 %
Monocytes Absolute: 0.5 10*3/uL (ref 0.1–0.9)
Neutrophils Absolute: 5.8 10*3/uL (ref 1.4–7.0)
Neutrophils: 64 %
Platelets: 229 10*3/uL (ref 150–379)
RBC: 4.19 x10E6/uL (ref 3.77–5.28)
RDW: 13.2 % (ref 12.3–15.4)
RPR Ser Ql: NONREACTIVE
RUBELLA: 1.8 {index} (ref >0.99)
Rh Factor: POSITIVE
WBC: 9 10*3/uL (ref 3.4–10.8)

## 2018-01-21 LAB — URINALYSIS, ROUTINE W REFLEX MICROSCOPIC
BILIRUBIN UA: NEGATIVE
Glucose, UA: NEGATIVE
KETONES UA: NEGATIVE
Leukocytes, UA: NEGATIVE
NITRITE UA: NEGATIVE
Protein, UA: NEGATIVE
RBC UA: NEGATIVE
Specific Gravity, UA: 1.021 (ref 1.005–1.030)
UUROB: 1 mg/dL (ref 0.2–1.0)
pH, UA: 7.5 (ref 5.0–7.5)

## 2018-01-21 LAB — MED LIST OPTION NOT SELECTED

## 2018-01-22 LAB — GC/CHLAMYDIA PROBE AMP
Chlamydia trachomatis, NAA: NEGATIVE
Neisseria gonorrhoeae by PCR: NEGATIVE

## 2018-01-22 LAB — URINE CULTURE: ORGANISM ID, BACTERIA: NO GROWTH

## 2018-01-23 LAB — PMP SCREEN PROFILE (10S), URINE
AMPHETAMINE SCREEN URINE: NEGATIVE ng/mL
BARBITURATE SCREEN URINE: NEGATIVE ng/mL
BENZODIAZEPINE SCREEN, URINE: NEGATIVE ng/mL
CANNABINOIDS UR QL SCN: NEGATIVE ng/mL
CREATININE(CRT), U: 92.7 mg/dL (ref 20.0–300.0)
Cocaine (Metab) Scrn, Ur: NEGATIVE ng/mL
METHADONE SCREEN, URINE: NEGATIVE ng/mL
OPIATE SCREEN URINE: NEGATIVE ng/mL
OXYCODONE+OXYMORPHONE UR QL SCN: NEGATIVE ng/mL
PHENCYCLIDINE QUANTITATIVE URINE: NEGATIVE ng/mL
PROPOXYPHENE SCREEN URINE: NEGATIVE ng/mL
Ph of Urine: 7.5 (ref 4.5–8.9)

## 2018-02-17 ENCOUNTER — Ambulatory Visit (INDEPENDENT_AMBULATORY_CARE_PROVIDER_SITE_OTHER): Payer: PRIVATE HEALTH INSURANCE

## 2018-02-17 ENCOUNTER — Ambulatory Visit (INDEPENDENT_AMBULATORY_CARE_PROVIDER_SITE_OTHER): Payer: PRIVATE HEALTH INSURANCE | Admitting: Women's Health

## 2018-02-17 ENCOUNTER — Encounter: Payer: Self-pay | Admitting: Women's Health

## 2018-02-17 VITALS — BP 100/60 | HR 95 | Wt 184.0 lb

## 2018-02-17 DIAGNOSIS — O09219 Supervision of pregnancy with history of pre-term labor, unspecified trimester: Secondary | ICD-10-CM

## 2018-02-17 DIAGNOSIS — Z3481 Encounter for supervision of other normal pregnancy, first trimester: Secondary | ICD-10-CM

## 2018-02-17 DIAGNOSIS — O09291 Supervision of pregnancy with other poor reproductive or obstetric history, first trimester: Secondary | ICD-10-CM

## 2018-02-17 DIAGNOSIS — Z3A13 13 weeks gestation of pregnancy: Secondary | ICD-10-CM | POA: Diagnosis not present

## 2018-02-17 DIAGNOSIS — Z3682 Encounter for antenatal screening for nuchal translucency: Secondary | ICD-10-CM

## 2018-02-17 DIAGNOSIS — Z1389 Encounter for screening for other disorder: Secondary | ICD-10-CM

## 2018-02-17 DIAGNOSIS — Z1379 Encounter for other screening for genetic and chromosomal anomalies: Secondary | ICD-10-CM

## 2018-02-17 DIAGNOSIS — Z331 Pregnant state, incidental: Secondary | ICD-10-CM

## 2018-02-17 DIAGNOSIS — O09899 Supervision of other high risk pregnancies, unspecified trimester: Secondary | ICD-10-CM

## 2018-02-17 DIAGNOSIS — O09211 Supervision of pregnancy with history of pre-term labor, first trimester: Secondary | ICD-10-CM

## 2018-02-17 LAB — POCT URINALYSIS DIPSTICK
Blood, UA: NEGATIVE
Glucose, UA: NEGATIVE
Ketones, UA: NEGATIVE
Leukocytes, UA: NEGATIVE
Nitrite, UA: NEGATIVE
Protein, UA: NEGATIVE

## 2018-02-17 NOTE — Progress Notes (Signed)
   LOW-RISK PREGNANCY VISIT Patient name: Katherine Villegas MRN [id number]  Date of birth: January 15, 1991 Chief Complaint:   Routine Prenatal Visit (NT/1IT)  History of Present Illness:   Katherine Villegas is a 27 y.o. [id number] female at 1153w2d with an Estimated Date of Delivery: 08/23/18 being seen today for ongoing management of a low-risk pregnancy.  Today she reports she has decided she wants to do weekly Makena for h/o PTB.  .  .  Movement: Absent. denies leaking of fluid. Review of Systems:   Pertinent items are noted in HPI Denies abnormal vaginal discharge w/ itching/odor/irritation, headaches, visual changes, shortness of breath, chest pain, abdominal pain, severe nausea/vomiting, or problems with urination or bowel movements unless otherwise stated above. Pertinent History Reviewed:  Reviewed past medical,surgical, social, obstetrical and family history.  Reviewed problem list, medications and allergies. Physical Assessment:   Vitals:   02/17/18 0916  BP: 100/60  Pulse: 95  Weight: 184 lb (83.5 kg)  Body mass index is 33.12 kg/m.        Physical Examination:   General appearance: Well appearing, and in no distress  Mental status: Alert, oriented to person, place, and time  Skin: Warm & dry  Cardiovascular: Normal heart rate noted  Respiratory: Normal respiratory effort, no distress  Abdomen: Soft, gravid, nontender  Pelvic: Cervical exam deferred         Extremities:    Fetal Status: Fetal Heart Rate (bpm): 164 u/s   Movement: Absent    Results for orders placed or performed in visit on 02/17/18 (from the past 24 hour(s))  POCT urinalysis dipstick   Collection Time: 02/17/18  9:19 AM  Result Value Ref Range   Color, UA     Clarity, UA     Glucose, UA neg    Bilirubin, UA     Ketones, UA neg    Spec Grav, UA  1.010 - 1.025   Blood, UA neg    pH, UA  5.0 - 8.0   Protein, UA neg    Urobilinogen, UA  0.2 or 1.0 E.U./dL   Nitrite, UA neg    Leukocytes, UA Negative Negative     Appearance     Odor      Assessment & Plan:  1) Low-risk pregnancy [id number] at 9053w2d with an Estimated Date of Delivery: 08/23/18   2) H/O PTB, wants Makena, ordered today, start @ 16wks   Meds: No orders of the defined types were placed in this encounter.  Labs/procedures today: 1st IT/NT  Plan:  Continue routine obstetrical care   Reviewed: Preterm labor symptoms and general obstetric precautions including but not limited to vaginal bleeding, contractions, leaking of fluid and fetal movement were reviewed in detail with the patient.  All questions were answered  Follow-up: Return in about 3 weeks (around 03/10/2018) for LROB, 2nd IT and start weekly Makena at that time.  Orders Placed This Encounter  Procedures  . Integrated 1  . POCT urinalysis dipstick   Cheral MarkerKimberly R Chi Woodham CNM, [facility] Services[facility]NP-BC 02/17/2018 9:48 AM

## 2018-02-17 NOTE — Progress Notes (Signed)
US 13+2 wks,measurements c/w dates,normal ovaries bilat,crl 72.52,NB present,NT 2.3 mm,fhr 164 bpm,posterior pl gr 0

## 2018-02-17 NOTE — Patient Instructions (Signed)
Katherine Villegas, I greatly value your feedback.  If you receive a survey following your visit with us today, we appreciate you taking the time to fill it out.  Thanks, Joellyn HaffKim Salayah Meares, CNM, WHNP-BC   Second Trimester of Pregnancy The second trimester is from week 14 through week 27 (months 4 through 6). The second trimester is often a time when you feel your best. Your body has adjusted to being pregnant, and you begin to feel better physically. Usually, morning sickness has lessened or quit completely, you may have more energy, and you may have an increase in appetite. The second trimester is also a time when the fetus is growing rapidly. At the end of the sixth month, the fetus is about 9 inches long and weighs about 1 pounds. You will likely begin to feel the baby move (quickening) between 16 and 20 weeks of pregnancy. Body changes during your second trimester Your body continues to go through many changes during your second trimester. The changes vary from woman to woman.  Your weight will continue to increase. You will notice your lower abdomen bulging out.  You may begin to get stretch marks on your hips, abdomen, and breasts.  You may develop headaches that can be relieved by medicines. The medicines should be approved by your health care provider.  You may urinate more often because the fetus is pressing on your bladder.  You may develop or continue to have heartburn as a result of your pregnancy.  You may develop constipation because certain hormones are causing the muscles that push waste through your intestines to slow down.  You may develop hemorrhoids or swollen, bulging veins (varicose veins).  You may have back pain. This is caused by: ? Weight gain. ? Pregnancy hormones that are relaxing the joints in your pelvis. ? A shift in weight and the muscles that support your balance.  Your breasts will continue to grow and they will continue to become tender.  Your gums may bleed and  may be sensitive to brushing and flossing.  Dark spots or blotches (chloasma, mask of pregnancy) may develop on your face. This will likely fade after the baby is born.  A dark line from your belly button to the pubic area (linea nigra) may appear. This will likely fade after the baby is born.  You may have changes in your hair. These can include thickening of your hair, rapid growth, and changes in texture. Some women also have hair loss during or after pregnancy, or hair that feels dry or thin. Your hair will most likely return to normal after your baby is born.  What to expect at prenatal visits During a routine prenatal visit:  You will be weighed to make sure you and the fetus are growing normally.  Your blood pressure will be taken.  Your abdomen will be measured to track your baby's growth.  The fetal heartbeat will be listened to.  Any test results from the previous visit will be discussed.  Your health care provider may ask you:  How you are feeling.  If you are feeling the baby move.  If you have had any abnormal symptoms, such as leaking fluid, bleeding, severe headaches, or abdominal cramping.  If you are using any tobacco products, including cigarettes, chewing tobacco, and electronic cigarettes.  If you have any questions.  Other tests that may be performed during your second trimester include:  Blood tests that check for: ? Low iron levels (anemia). ? High blood  sugar that affects pregnant women (gestational diabetes) between 55 and 28 weeks. ? Rh antibodies. This is to check for a protein on red blood cells (Rh factor).  Urine tests to check for infections, diabetes, or protein in the urine.  An ultrasound to confirm the proper growth and development of the baby.  An amniocentesis to check for possible genetic problems.  Fetal screens for spina bifida and Down syndrome.  HIV (human immunodeficiency virus) testing. Routine prenatal testing includes  screening for HIV, unless you choose not to have this test.  Follow these instructions at home: Medicines  Follow your health care provider's instructions regarding medicine use. Specific medicines may be either safe or unsafe to take during pregnancy.  Take a prenatal vitamin that contains at least 600 micrograms (mcg) of folic acid.  If you develop constipation, try taking a stool softener if your health care provider approves. Eating and drinking  Eat a balanced diet that includes fresh fruits and vegetables, whole grains, good sources of protein such as meat, eggs, or tofu, and low-fat dairy. Your health care provider will help you determine the amount of weight gain that is right for you.  Avoid raw meat and uncooked cheese. These carry germs that can cause birth defects in the baby.  If you have low calcium intake from food, talk to your health care provider about whether you should take a daily calcium supplement.  Limit foods that are high in fat and processed sugars, such as fried and sweet foods.  To prevent constipation: ? Drink enough fluid to keep your urine clear or pale yellow. ? Eat foods that are high in fiber, such as fresh fruits and vegetables, whole grains, and beans. Activity  Exercise only as directed by your health care provider. Most women can continue their usual exercise routine during pregnancy. Try to exercise for 30 minutes at least 5 days a week. Stop exercising if you experience uterine contractions.  Avoid heavy lifting, wear low heel shoes, and practice good posture.  A sexual relationship may be continued unless your health care provider directs you otherwise. Relieving pain and discomfort  Wear a good support bra to prevent discomfort from breast tenderness.  Take warm sitz baths to soothe any pain or discomfort caused by hemorrhoids. Use hemorrhoid cream if your health care provider approves.  Rest with your legs elevated if you have leg cramps  or low back pain.  If you develop varicose veins, wear support hose. Elevate your feet for 15 minutes, 3-4 times a day. Limit salt in your diet. Prenatal Care  Write down your questions. Take them to your prenatal visits.  Keep all your prenatal visits as told by your health care provider. This is important. Safety  Wear your seat belt at all times when driving.  Make a list of emergency phone numbers, including numbers for family, friends, the hospital, and police and fire departments. General instructions  Ask your health care provider for a referral to a local prenatal education class. Begin classes no later than the beginning of month 6 of your pregnancy.  Ask for help if you have counseling or nutritional needs during pregnancy. Your health care provider can offer advice or refer you to specialists for help with various needs.  Do not use hot tubs, steam rooms, or saunas.  Do not douche or use tampons or scented sanitary pads.  Do not cross your legs for long periods of time.  Avoid cat litter boxes and soil used  by cats. These carry germs that can cause birth defects in the baby and possibly loss of the fetus by miscarriage or stillbirth.  Avoid all smoking, herbs, alcohol, and unprescribed drugs. Chemicals in these products can affect the formation and growth of the baby.  Do not use any products that contain nicotine or tobacco, such as cigarettes and e-cigarettes. If you need help quitting, ask your health care provider.  Visit your dentist if you have not gone yet during your pregnancy. Use a soft toothbrush to brush your teeth and be gentle when you floss. Contact a health care provider if:  You have dizziness.  You have mild pelvic cramps, pelvic pressure, or nagging pain in the abdominal area.  You have persistent nausea, vomiting, or diarrhea.  You have a bad smelling vaginal discharge.  You have pain when you urinate. Get help right away if:  You have a  fever.  You are leaking fluid from your vagina.  You have spotting or bleeding from your vagina.  You have severe abdominal cramping or pain.  You have rapid weight gain or weight loss.  You have shortness of breath with chest pain.  You notice sudden or extreme swelling of your face, hands, ankles, feet, or legs.  You have not felt your baby move in over an hour.  You have severe headaches that do not go away when you take medicine.  You have vision changes. Summary  The second trimester is from week 14 through week 27 (months 4 through 6). It is also a time when the fetus is growing rapidly.  Your body goes through many changes during pregnancy. The changes vary from woman to woman.  Avoid all smoking, herbs, alcohol, and unprescribed drugs. These chemicals affect the formation and growth your baby.  Do not use any tobacco products, such as cigarettes, chewing tobacco, and e-cigarettes. If you need help quitting, ask your health care provider.  Contact your health care provider if you have any questions. Keep all prenatal visits as told by your health care provider. This is important. This information is not intended to replace advice given to you by your health care provider. Make sure you discuss any questions you have with your health care provider. Document Released: 10/19/2001 Document Revised: 04/01/2016 Document Reviewed: 12/26/2012 Elsevier Interactive Patient Education  2017 [employer].

## 2018-02-20 LAB — INTEGRATED 1
Crown Rump Length: 72.5 mm
Gest. Age on Collection Date: 13.1 weeks
Maternal Age at EDD: 27.6 yr
Nuchal Translucency (NT): 2.3 mm
Number of Fetuses: 1
PAPP-A Value: 420 ng/mL
Weight: 184 [lb_av]

## 2018-03-07 ENCOUNTER — Encounter: Payer: Self-pay | Admitting: Neurology

## 2018-03-10 ENCOUNTER — Encounter: Payer: Self-pay | Admitting: Women's Health

## 2018-03-10 ENCOUNTER — Other Ambulatory Visit: Payer: Self-pay

## 2018-03-10 ENCOUNTER — Ambulatory Visit (INDEPENDENT_AMBULATORY_CARE_PROVIDER_SITE_OTHER): Payer: PRIVATE HEALTH INSURANCE | Admitting: Women's Health

## 2018-03-10 VITALS — BP 104/66 | HR 88 | Wt 188.0 lb

## 2018-03-10 DIAGNOSIS — Z331 Pregnant state, incidental: Secondary | ICD-10-CM

## 2018-03-10 DIAGNOSIS — O09899 Supervision of other high risk pregnancies, unspecified trimester: Secondary | ICD-10-CM

## 2018-03-10 DIAGNOSIS — R52 Pain, unspecified: Secondary | ICD-10-CM

## 2018-03-10 DIAGNOSIS — Z8751 Personal history of pre-term labor: Secondary | ICD-10-CM

## 2018-03-10 DIAGNOSIS — O9989 Other specified diseases and conditions complicating pregnancy, childbirth and the puerperium: Secondary | ICD-10-CM

## 2018-03-10 DIAGNOSIS — Z1389 Encounter for screening for other disorder: Secondary | ICD-10-CM

## 2018-03-10 DIAGNOSIS — O09212 Supervision of pregnancy with history of pre-term labor, second trimester: Secondary | ICD-10-CM

## 2018-03-10 DIAGNOSIS — O09219 Supervision of pregnancy with history of pre-term labor, unspecified trimester: Secondary | ICD-10-CM

## 2018-03-10 DIAGNOSIS — Z1379 Encounter for other screening for genetic and chromosomal anomalies: Secondary | ICD-10-CM

## 2018-03-10 DIAGNOSIS — Z3A16 16 weeks gestation of pregnancy: Secondary | ICD-10-CM

## 2018-03-10 DIAGNOSIS — O34219 Maternal care for unspecified type scar from previous cesarean delivery: Secondary | ICD-10-CM

## 2018-03-10 DIAGNOSIS — Z3482 Encounter for supervision of other normal pregnancy, second trimester: Secondary | ICD-10-CM

## 2018-03-10 LAB — POCT URINALYSIS DIPSTICK
Blood, UA: NEGATIVE
Glucose, UA: NEGATIVE
Ketones, UA: NEGATIVE
LEUKOCYTES UA: NEGATIVE
Nitrite, UA: NEGATIVE
Protein, UA: NEGATIVE

## 2018-03-10 MED ORDER — HYDROXYPROGESTERONE CAPROATE 275 MG/1.1ML ~~LOC~~ SOAJ
275.0000 mg | Freq: Once | SUBCUTANEOUS | Status: AC
  Administered 2018-03-10: 275 mg via SUBCUTANEOUS

## 2018-03-10 NOTE — Progress Notes (Signed)
Pt given Makena 275mg SQ left arm without complications.  

## 2018-03-10 NOTE — Progress Notes (Signed)
   LOW-RISK PREGNANCY VISIT Patient name: Katherine Villegas MRN [id number]  Date of birth: Dec 17, 1990 Chief Complaint:   Routine Prenatal Visit (2nd IT)  History of Present Illness:   Katherine Villegas is a 27 y.o. [id number] female at [redacted]w[redacted]d with an Estimated Date of Delivery: 08/23/18 being seen today for ongoing management of a low-risk pregnancy.  Today she reports body aches all over- just aches and pains- wants to know if ok to see chiropractor.  . Vag. Bleeding: None.  Movement: Present. denies leaking of fluid. Review of Systems:   Pertinent items are noted in HPI Denies abnormal vaginal discharge w/ itching/odor/irritation, headaches, visual changes, shortness of breath, chest pain, abdominal pain, severe nausea/vomiting, or problems with urination or bowel movements unless otherwise stated above. Pertinent History Reviewed:  Reviewed past medical,surgical, social, obstetrical and family history.  Reviewed problem list, medications and allergies. Physical Assessment:   Vitals:   03/10/18 0916  BP: 104/66  Pulse: 88  Weight: 188 lb (85.3 kg)  Body mass index is 33.84 kg/m.        Physical Examination:   General appearance: Well appearing, and in no distress  Mental status: Alert, oriented to person, place, and time  Skin: Warm & dry  Cardiovascular: Normal heart rate noted  Respiratory: Normal respiratory effort, no distress  Abdomen: Soft, gravid, nontender  Pelvic: Cervical exam deferred         Extremities: Edema: None  Fetal Status: Fetal Heart Rate (bpm): 156   Movement: Present    Results for orders placed or performed in visit on 03/10/18 (from the past 24 hour(s))  POCT urinalysis dipstick   Collection Time: 03/10/18  9:17 AM  Result Value Ref Range   Color, UA     Clarity, UA     Glucose, UA neg    Bilirubin, UA     Ketones, UA neg    Spec Grav, UA  1.010 - 1.025   Blood, UA neg    pH, UA  5.0 - 8.0   Protein, UA neg    Urobilinogen, UA  0.2 or 1.0 E.U./dL   Nitrite, UA neg    Leukocytes, UA Negative Negative   Appearance     Odor      Assessment & Plan:  1) Low-risk pregnancy [id number] at [redacted]w[redacted]d with an Estimated Date of Delivery: 08/23/18   2) Overall aches & pains, is ok to see chiropractor  3) H/O PTB> 1st dose Makena today  4) Prev c/s then VBAC> wants TOLAC   Meds:  Meds ordered this encounter  Medications  . HYDROXYprogesterone Caproate SOAJ 275 mg   Labs/procedures today: Makena  Plan:  Continue routine obstetrical care   Reviewed: Preterm labor symptoms and general obstetric precautions including but not limited to vaginal bleeding, contractions, leaking of fluid and fetal movement were reviewed in detail with the patient.  All questions were answered  Follow-up: Return in about 2 weeks (around 03/24/2018) for LROB, AO:[id number], Weekly 17P.  Orders Placed This Encounter  Procedures  . INTEGRATED 2  . POCT urinalysis dipstick   Cheral Marker CNM, [facility] 03/10/2018 9:41 AM

## 2018-03-10 NOTE — Patient Instructions (Signed)
Katherine Villegas, I greatly value your feedback.  If you receive a survey following your visit with [place] today, we appreciate you taking the time to fill it out.  Thanks, [name], CNM, WHNP-BC   Second Trimester of Pregnancy The second trimester is from week 14 through week 27 (months 4 through 6). The second trimester is often a time when you feel your best. Your body has adjusted to being pregnant, and you begin to feel better physically. Usually, morning sickness has lessened or quit completely, you may have more energy, and you may have an increase in appetite. The second trimester is also a time when the fetus is growing rapidly. At the end of the sixth month, the fetus is about 9 inches long and weighs about 1 pounds. You will likely begin to feel the baby move (quickening) between 16 and 20 weeks of pregnancy. Body changes during your second trimester Your body continues to go through many changes during your second trimester. The changes vary from woman to woman.  Your weight will continue to increase. You will notice your lower abdomen bulging out.  You may begin to get stretch marks on your hips, abdomen, and breasts.  You may develop headaches that can be relieved by medicines. The medicines should be approved by your health care provider.  You may urinate more often because the fetus is pressing on your bladder.  You may develop or continue to have heartburn as a result of your pregnancy.  You may develop constipation because certain hormones are causing the muscles that push waste through your intestines to slow down.  You may develop hemorrhoids or swollen, bulging veins (varicose veins).  You may have back pain. This is caused by: ? Weight gain. ? Pregnancy hormones that are relaxing the joints in your pelvis. ? A shift in weight and the muscles that support your balance.  Your breasts will continue to grow and they will continue to become tender.  Your gums may bleed and  may be sensitive to brushing and flossing.  Dark spots or blotches (chloasma, mask of pregnancy) may develop on your face. This will likely fade after the baby is born.  A dark line from your belly button to the pubic area (linea nigra) may appear. This will likely fade after the baby is born.  You may have changes in your hair. These can include thickening of your hair, rapid growth, and changes in texture. Some women also have hair loss during or after pregnancy, or hair that feels dry or thin. Your hair will most likely return to normal after your baby is born.  What to expect at prenatal visits During a routine prenatal visit:  You will be weighed to make sure you and the fetus are growing normally.  Your blood pressure will be taken.  Your abdomen will be measured to track your baby's growth.  The fetal heartbeat will be listened to.  Any test results from the previous visit will be discussed.  Your health care provider may ask you:  How you are feeling.  If you are feeling the baby move.  If you have had any abnormal symptoms, such as leaking fluid, bleeding, severe headaches, or abdominal cramping.  If you are using any tobacco products, including cigarettes, chewing tobacco, and electronic cigarettes.  If you have any questions.  Other tests that may be performed during your second trimester include:  Blood tests that check for: ? Low iron levels (anemia). ? High blood  sugar that affects pregnant women (gestational diabetes) between 55 and 28 weeks. ? Rh antibodies. This is to check for a protein on red blood cells (Rh factor).  Urine tests to check for infections, diabetes, or protein in the urine.  An ultrasound to confirm the proper growth and development of the baby.  An amniocentesis to check for possible genetic problems.  Fetal screens for spina bifida and Down syndrome.  HIV (human immunodeficiency virus) testing. Routine prenatal testing includes  screening for HIV, unless you choose not to have this test.  Follow these instructions at home: Medicines  Follow your health care provider's instructions regarding medicine use. Specific medicines may be either safe or unsafe to take during pregnancy.  Take a prenatal vitamin that contains at least 600 micrograms (mcg) of folic acid.  If you develop constipation, try taking a stool softener if your health care provider approves. Eating and drinking  Eat a balanced diet that includes fresh fruits and vegetables, whole grains, good sources of protein such as meat, eggs, or tofu, and low-fat dairy. Your health care provider will help you determine the amount of weight gain that is right for you.  Avoid raw meat and uncooked cheese. These carry germs that can cause birth defects in the baby.  If you have low calcium intake from food, talk to your health care provider about whether you should take a daily calcium supplement.  Limit foods that are high in fat and processed sugars, such as fried and sweet foods.  To prevent constipation: ? Drink enough fluid to keep your urine clear or pale yellow. ? Eat foods that are high in fiber, such as fresh fruits and vegetables, whole grains, and beans. Activity  Exercise only as directed by your health care provider. Most women can continue their usual exercise routine during pregnancy. Try to exercise for 30 minutes at least 5 days a week. Stop exercising if you experience uterine contractions.  Avoid heavy lifting, wear low heel shoes, and practice good posture.  A sexual relationship may be continued unless your health care provider directs you otherwise. Relieving pain and discomfort  Wear a good support bra to prevent discomfort from breast tenderness.  Take warm sitz baths to soothe any pain or discomfort caused by hemorrhoids. Use hemorrhoid cream if your health care provider approves.  Rest with your legs elevated if you have leg cramps  or low back pain.  If you develop varicose veins, wear support hose. Elevate your feet for 15 minutes, 3-4 times a day. Limit salt in your diet. Prenatal Care  Write down your questions. Take them to your prenatal visits.  Keep all your prenatal visits as told by your health care provider. This is important. Safety  Wear your seat belt at all times when driving.  Make a list of emergency phone numbers, including numbers for family, friends, the hospital, and police and fire departments. General instructions  Ask your health care provider for a referral to a local prenatal education class. Begin classes no later than the beginning of month 6 of your pregnancy.  Ask for help if you have counseling or nutritional needs during pregnancy. Your health care provider can offer advice or refer you to specialists for help with various needs.  Do not use hot tubs, steam rooms, or saunas.  Do not douche or use tampons or scented sanitary pads.  Do not cross your legs for long periods of time.  Avoid cat litter boxes and soil used  by cats. These carry germs that can cause birth defects in the baby and possibly loss of the fetus by miscarriage or stillbirth.  Avoid all smoking, herbs, alcohol, and unprescribed drugs. Chemicals in these products can affect the formation and growth of the baby.  Do not use any products that contain nicotine or tobacco, such as cigarettes and e-cigarettes. If you need help quitting, ask your health care provider.  Visit your dentist if you have not gone yet during your pregnancy. Use a soft toothbrush to brush your teeth and be gentle when you floss. Contact a health care provider if:  You have dizziness.  You have mild pelvic cramps, pelvic pressure, or nagging pain in the abdominal area.  You have persistent nausea, vomiting, or diarrhea.  You have a bad smelling vaginal discharge.  You have pain when you urinate. Get help right away if:  You have a  fever.  You are leaking fluid from your vagina.  You have spotting or bleeding from your vagina.  You have severe abdominal cramping or pain.  You have rapid weight gain or weight loss.  You have shortness of breath with chest pain.  You notice sudden or extreme swelling of your face, hands, ankles, feet, or legs.  You have not felt your baby move in over an hour.  You have severe headaches that do not go away when you take medicine.  You have vision changes. Summary  The second trimester is from week 14 through week 27 (months 4 through 6). It is also a time when the fetus is growing rapidly.  Your body goes through many changes during pregnancy. The changes vary from woman to woman.  Avoid all smoking, herbs, alcohol, and unprescribed drugs. These chemicals affect the formation and growth your baby.  Do not use any tobacco products, such as cigarettes, chewing tobacco, and e-cigarettes. If you need help quitting, ask your health care provider.  Contact your health care provider if you have any questions. Keep all prenatal visits as told by your health care provider. This is important. This information is not intended to replace advice given to you by your health care provider. Make sure you discuss any questions you have with your health care provider. Document Released: 10/19/2001 Document Revised: 04/01/2016 Document Reviewed: 12/26/2012 Elsevier Interactive Patient Education  2017 [employer].

## 2018-03-16 ENCOUNTER — Ambulatory Visit (INDEPENDENT_AMBULATORY_CARE_PROVIDER_SITE_OTHER): Payer: PRIVATE HEALTH INSURANCE

## 2018-03-16 ENCOUNTER — Telehealth: Payer: Self-pay | Admitting: *Deleted

## 2018-03-16 VITALS — BP 100/60 | HR 96 | Wt 187.4 lb

## 2018-03-16 DIAGNOSIS — O09892 Supervision of other high risk pregnancies, second trimester: Secondary | ICD-10-CM

## 2018-03-16 DIAGNOSIS — Z3A17 17 weeks gestation of pregnancy: Secondary | ICD-10-CM | POA: Diagnosis not present

## 2018-03-16 DIAGNOSIS — Z331 Pregnant state, incidental: Secondary | ICD-10-CM

## 2018-03-16 DIAGNOSIS — O09212 Supervision of pregnancy with history of pre-term labor, second trimester: Secondary | ICD-10-CM | POA: Diagnosis not present

## 2018-03-16 DIAGNOSIS — Z3482 Encounter for supervision of other normal pregnancy, second trimester: Secondary | ICD-10-CM

## 2018-03-16 DIAGNOSIS — Z1389 Encounter for screening for other disorder: Secondary | ICD-10-CM

## 2018-03-16 LAB — INTEGRATED 2
AFP MOM: 0.88
Alpha-Fetoprotein: 23.9 ng/mL
CROWN RUMP LENGTH: 72.5 mm
DIA MoM: 1.73
DIA VALUE: 257.4 pg/mL
Estriol, Unconjugated: 0.91 ng/mL
GESTATIONAL AGE: 16.1 wk
Gest. Age on Collection Date: 13.1 weeks
MATERNAL AGE AT EDD: 27.6 a
Nuchal Translucency (NT): 2.3 mm
Nuchal Translucency MoM: 1.39
Number of Fetuses: 1
PAPP-A MOM: 0.45
PAPP-A VALUE: 420 ng/mL
TEST RESULTS: NEGATIVE
WEIGHT: 184 [lb_av]
Weight: 184 [lb_av]
hCG MoM: 0.34
hCG Value: 9.9 IU/mL
uE3 MoM: 1.22

## 2018-03-16 LAB — POCT URINALYSIS DIPSTICK
Blood, UA: NEGATIVE
GLUCOSE UA: NEGATIVE
Ketones, UA: NEGATIVE
LEUKOCYTES UA: NEGATIVE
NITRITE UA: NEGATIVE

## 2018-03-16 MED ORDER — HYDROXYPROGESTERONE CAPROATE 250 MG/ML IM OIL
250.0000 mg | TOPICAL_OIL | Freq: Once | INTRAMUSCULAR | Status: AC
  Administered 2018-03-16: 250 mg via INTRAMUSCULAR

## 2018-03-16 NOTE — Progress Notes (Addendum)
Pt here for Makena 250 mg IM given rt arm at patient request. Have back and nerve problem.Spoke Bethann Berkshire about given in rt deltoid, stated okay to give.T.olerated well . Return 1 week for next injection. Pad CMA

## 2018-03-16 NOTE — Telephone Encounter (Signed)
Patient informed she needs to call [employer] to consent to [facility] in order for it to be shipped to our office.  Number given to patient who stated she would call.

## 2018-03-21 ENCOUNTER — Other Ambulatory Visit: Payer: Self-pay | Admitting: Women's Health

## 2018-03-21 DIAGNOSIS — Z363 Encounter for antenatal screening for malformations: Secondary | ICD-10-CM

## 2018-03-22 ENCOUNTER — Telehealth: Payer: Self-pay | Admitting: *Deleted

## 2018-03-22 NOTE — Telephone Encounter (Signed)
Makena to be shipped on 5/15. Should arrive on 5/16.

## 2018-03-24 ENCOUNTER — Ambulatory Visit (INDEPENDENT_AMBULATORY_CARE_PROVIDER_SITE_OTHER): Payer: PRIVATE HEALTH INSURANCE

## 2018-03-24 ENCOUNTER — Ambulatory Visit (INDEPENDENT_AMBULATORY_CARE_PROVIDER_SITE_OTHER): Payer: PRIVATE HEALTH INSURANCE | Admitting: Women's Health

## 2018-03-24 ENCOUNTER — Encounter: Payer: Self-pay | Admitting: Women's Health

## 2018-03-24 VITALS — BP 100/60 | HR 95 | Wt 191.0 lb

## 2018-03-24 DIAGNOSIS — O09212 Supervision of pregnancy with history of pre-term labor, second trimester: Secondary | ICD-10-CM

## 2018-03-24 DIAGNOSIS — Z363 Encounter for antenatal screening for malformations: Secondary | ICD-10-CM

## 2018-03-24 DIAGNOSIS — Z1389 Encounter for screening for other disorder: Secondary | ICD-10-CM

## 2018-03-24 DIAGNOSIS — O09899 Supervision of other high risk pregnancies, unspecified trimester: Secondary | ICD-10-CM

## 2018-03-24 DIAGNOSIS — Z3482 Encounter for supervision of other normal pregnancy, second trimester: Secondary | ICD-10-CM

## 2018-03-24 DIAGNOSIS — O09219 Supervision of pregnancy with history of pre-term labor, unspecified trimester: Secondary | ICD-10-CM

## 2018-03-24 DIAGNOSIS — Z331 Pregnant state, incidental: Secondary | ICD-10-CM

## 2018-03-24 DIAGNOSIS — Z3A18 18 weeks gestation of pregnancy: Secondary | ICD-10-CM

## 2018-03-24 DIAGNOSIS — Z3402 Encounter for supervision of normal first pregnancy, second trimester: Secondary | ICD-10-CM

## 2018-03-24 DIAGNOSIS — O34219 Maternal care for unspecified type scar from previous cesarean delivery: Secondary | ICD-10-CM

## 2018-03-24 LAB — POCT URINALYSIS DIPSTICK
GLUCOSE UA: NEGATIVE
Ketones, UA: NEGATIVE
Leukocytes, UA: NEGATIVE
Nitrite, UA: NEGATIVE
Protein, UA: NEGATIVE
RBC UA: NEGATIVE

## 2018-03-24 MED ORDER — HYDROXYPROGESTERONE CAPROATE 275 MG/1.1ML ~~LOC~~ SOAJ
275.0000 mg | Freq: Once | SUBCUTANEOUS | Status: AC
  Administered 2018-03-24: 275 mg via SUBCUTANEOUS

## 2018-03-24 NOTE — Patient Instructions (Signed)
Katherine Villegas, I greatly value your feedback.  If you receive a survey following your visit with [place] today, we appreciate you taking the time to fill it out.  Thanks, [name], CNM, WHNP-BC   Second Trimester of Pregnancy The second trimester is from week 14 through week 27 (months 4 through 6). The second trimester is often a time when you feel your best. Your body has adjusted to being pregnant, and you begin to feel better physically. Usually, morning sickness has lessened or quit completely, you may have more energy, and you may have an increase in appetite. The second trimester is also a time when the fetus is growing rapidly. At the end of the sixth month, the fetus is about 9 inches long and weighs about 1 pounds. You will likely begin to feel the baby move (quickening) between 16 and 20 weeks of pregnancy. Body changes during your second trimester Your body continues to go through many changes during your second trimester. The changes vary from woman to woman.  Your weight will continue to increase. You will notice your lower abdomen bulging out.  You may begin to get stretch marks on your hips, abdomen, and breasts.  You may develop headaches that can be relieved by medicines. The medicines should be approved by your health care provider.  You may urinate more often because the fetus is pressing on your bladder.  You may develop or continue to have heartburn as a result of your pregnancy.  You may develop constipation because certain hormones are causing the muscles that push waste through your intestines to slow down.  You may develop hemorrhoids or swollen, bulging veins (varicose veins).  You may have back pain. This is caused by: ? Weight gain. ? Pregnancy hormones that are relaxing the joints in your pelvis. ? A shift in weight and the muscles that support your balance.  Your breasts will continue to grow and they will continue to become tender.  Your gums may bleed and  may be sensitive to brushing and flossing.  Dark spots or blotches (chloasma, mask of pregnancy) may develop on your face. This will likely fade after the baby is born.  A dark line from your belly button to the pubic area (linea nigra) may appear. This will likely fade after the baby is born.  You may have changes in your hair. These can include thickening of your hair, rapid growth, and changes in texture. Some women also have hair loss during or after pregnancy, or hair that feels dry or thin. Your hair will most likely return to normal after your baby is born.  What to expect at prenatal visits During a routine prenatal visit:  You will be weighed to make sure you and the fetus are growing normally.  Your blood pressure will be taken.  Your abdomen will be measured to track your baby's growth.  The fetal heartbeat will be listened to.  Any test results from the previous visit will be discussed.  Your health care provider may ask you:  How you are feeling.  If you are feeling the baby move.  If you have had any abnormal symptoms, such as leaking fluid, bleeding, severe headaches, or abdominal cramping.  If you are using any tobacco products, including cigarettes, chewing tobacco, and electronic cigarettes.  If you have any questions.  Other tests that may be performed during your second trimester include:  Blood tests that check for: ? Low iron levels (anemia). ? High blood  sugar that affects pregnant women (gestational diabetes) between 55 and 28 weeks. ? Rh antibodies. This is to check for a protein on red blood cells (Rh factor).  Urine tests to check for infections, diabetes, or protein in the urine.  An ultrasound to confirm the proper growth and development of the baby.  An amniocentesis to check for possible genetic problems.  Fetal screens for spina bifida and Down syndrome.  HIV (human immunodeficiency virus) testing. Routine prenatal testing includes  screening for HIV, unless you choose not to have this test.  Follow these instructions at home: Medicines  Follow your health care provider's instructions regarding medicine use. Specific medicines may be either safe or unsafe to take during pregnancy.  Take a prenatal vitamin that contains at least 600 micrograms (mcg) of folic acid.  If you develop constipation, try taking a stool softener if your health care provider approves. Eating and drinking  Eat a balanced diet that includes fresh fruits and vegetables, whole grains, good sources of protein such as meat, eggs, or tofu, and low-fat dairy. Your health care provider will help you determine the amount of weight gain that is right for you.  Avoid raw meat and uncooked cheese. These carry germs that can cause birth defects in the baby.  If you have low calcium intake from food, talk to your health care provider about whether you should take a daily calcium supplement.  Limit foods that are high in fat and processed sugars, such as fried and sweet foods.  To prevent constipation: ? Drink enough fluid to keep your urine clear or pale yellow. ? Eat foods that are high in fiber, such as fresh fruits and vegetables, whole grains, and beans. Activity  Exercise only as directed by your health care provider. Most women can continue their usual exercise routine during pregnancy. Try to exercise for 30 minutes at least 5 days a week. Stop exercising if you experience uterine contractions.  Avoid heavy lifting, wear low heel shoes, and practice good posture.  A sexual relationship may be continued unless your health care provider directs you otherwise. Relieving pain and discomfort  Wear a good support bra to prevent discomfort from breast tenderness.  Take warm sitz baths to soothe any pain or discomfort caused by hemorrhoids. Use hemorrhoid cream if your health care provider approves.  Rest with your legs elevated if you have leg cramps  or low back pain.  If you develop varicose veins, wear support hose. Elevate your feet for 15 minutes, 3-4 times a day. Limit salt in your diet. Prenatal Care  Write down your questions. Take them to your prenatal visits.  Keep all your prenatal visits as told by your health care provider. This is important. Safety  Wear your seat belt at all times when driving.  Make a list of emergency phone numbers, including numbers for family, friends, the hospital, and police and fire departments. General instructions  Ask your health care provider for a referral to a local prenatal education class. Begin classes no later than the beginning of month 6 of your pregnancy.  Ask for help if you have counseling or nutritional needs during pregnancy. Your health care provider can offer advice or refer you to specialists for help with various needs.  Do not use hot tubs, steam rooms, or saunas.  Do not douche or use tampons or scented sanitary pads.  Do not cross your legs for long periods of time.  Avoid cat litter boxes and soil used  by cats. These carry germs that can cause birth defects in the baby and possibly loss of the fetus by miscarriage or stillbirth.  Avoid all smoking, herbs, alcohol, and unprescribed drugs. Chemicals in these products can affect the formation and growth of the baby.  Do not use any products that contain nicotine or tobacco, such as cigarettes and e-cigarettes. If you need help quitting, ask your health care provider.  Visit your dentist if you have not gone yet during your pregnancy. Use a soft toothbrush to brush your teeth and be gentle when you floss. Contact a health care provider if:  You have dizziness.  You have mild pelvic cramps, pelvic pressure, or nagging pain in the abdominal area.  You have persistent nausea, vomiting, or diarrhea.  You have a bad smelling vaginal discharge.  You have pain when you urinate. Get help right away if:  You have a  fever.  You are leaking fluid from your vagina.  You have spotting or bleeding from your vagina.  You have severe abdominal cramping or pain.  You have rapid weight gain or weight loss.  You have shortness of breath with chest pain.  You notice sudden or extreme swelling of your face, hands, ankles, feet, or legs.  You have not felt your baby move in over an hour.  You have severe headaches that do not go away when you take medicine.  You have vision changes. Summary  The second trimester is from week 14 through week 27 (months 4 through 6). It is also a time when the fetus is growing rapidly.  Your body goes through many changes during pregnancy. The changes vary from woman to woman.  Avoid all smoking, herbs, alcohol, and unprescribed drugs. These chemicals affect the formation and growth your baby.  Do not use any tobacco products, such as cigarettes, chewing tobacco, and e-cigarettes. If you need help quitting, ask your health care provider.  Contact your health care provider if you have any questions. Keep all prenatal visits as told by your health care provider. This is important. This information is not intended to replace advice given to you by your health care provider. Make sure you discuss any questions you have with your health care provider. Document Released: 10/19/2001 Document Revised: 04/01/2016 Document Reviewed: 12/26/2012 Elsevier Interactive Patient Education  2017 [employer].

## 2018-03-24 NOTE — Progress Notes (Signed)
[place] 18+2 wks,cephalic,cx 3.4 cm,posterior pl gr 0,normal ovaries bilat,svp of fluid 4.4 cm,fhr 148 bpm,efw 251 g 67 %,anatomy complete,no obvious abnormalities

## 2018-03-24 NOTE — Progress Notes (Signed)
   LOW-RISK PREGNANCY VISIT Patient name: Katherine Villegas MRN [id number]  Date of birth: 1991-05-28 Chief Complaint:   low risk ob (ultrasound)  History of Present Illness:   Katherine Villegas is a 27 y.o. [id number] female at [redacted]w[redacted]d with an Estimated Date of Delivery: 08/23/18 being seen today for ongoing management of a low-risk pregnancy.  Today she reports doesn't like autoinjector Makena, wants to switch to regular.  .  .  Movement: Present. denies leaking of fluid. Review of Systems:   Pertinent items are noted in HPI Denies abnormal vaginal discharge w/ itching/odor/irritation, headaches, visual changes, shortness of breath, chest pain, abdominal pain, severe nausea/vomiting, or problems with urination or bowel movements unless otherwise stated above. Pertinent History Reviewed:  Reviewed past medical,surgical, social, obstetrical and family history.  Reviewed problem list, medications and allergies. Physical Assessment:   Vitals:   03/24/18 0915  BP: 100/60  Pulse: 95  Weight: 191 lb (86.6 kg)  Body mass index is 34.38 kg/m.        Physical Examination:   General appearance: Well appearing, and in no distress  Mental status: Alert, oriented to person, place, and time  Skin: Warm & dry  Cardiovascular: Normal heart rate noted  Respiratory: Normal respiratory effort, no distress  Abdomen: Soft, gravid, nontender  Pelvic: Cervical exam deferred         Extremities: Edema: None  Fetal Status: Fetal Heart Rate (bpm): 148 u/s   Movement: Present    [place] 18+2 wks,cephalic,cx 3.4 cm,posterior pl gr 0,normal ovaries bilat,svp of fluid 4.4 cm,fhr 148 bpm,efw 251 g 67 %,anatomy complete,no obvious abnormalities   Results for orders placed or performed in visit on 03/24/18 (from the past 24 hour(s))  POCT urinalysis dipstick   Collection Time: 03/24/18  9:23 AM  Result Value Ref Range   Color, UA     Clarity, UA     Glucose, UA neg    Bilirubin, UA     Ketones, UA neg    Spec Grav, UA   1.010 - 1.025   Blood, UA neg    pH, UA  5.0 - 8.0   Protein, UA neg    Urobilinogen, UA  0.2 or 1.0 E.U./dL   Nitrite, UA neg    Leukocytes, UA Negative Negative   Appearance     Odor      Assessment & Plan:  1) Low-risk pregnancy [id number] at [redacted]w[redacted]d with an Estimated Date of Delivery: 08/23/18   2) H/O 32wk PTB, continue weekly Makena, Tish to see if we can switch to regular  3) Prev c/s> then VBAC, wants VBAC, discussed risks/benefits, consent signed today   Meds:  Meds ordered this encounter  Medications  . HYDROXYprogesterone Caproate SOAJ 275 mg   Labs/procedures today: anatomy u/s  Plan:  Continue routine obstetrical care   Reviewed: Preterm labor symptoms and general obstetric precautions including but not limited to vaginal bleeding, contractions, leaking of fluid and fetal movement were reviewed in detail with the patient.  All questions were answered  Follow-up: Return in about 1 month (around 04/21/2018) for LROB, Weekly 17P.  Orders Placed This Encounter  Procedures  . POCT urinalysis dipstick   Cheral Marker CNM, [facility] 03/24/2018 9:47 AM

## 2018-03-31 ENCOUNTER — Ambulatory Visit (INDEPENDENT_AMBULATORY_CARE_PROVIDER_SITE_OTHER): Payer: PRIVATE HEALTH INSURANCE | Admitting: *Deleted

## 2018-03-31 ENCOUNTER — Encounter: Payer: Self-pay | Admitting: *Deleted

## 2018-03-31 VITALS — BP 108/62 | HR 105 | Ht 63.0 in | Wt 195.0 lb

## 2018-03-31 DIAGNOSIS — O09212 Supervision of pregnancy with history of pre-term labor, second trimester: Secondary | ICD-10-CM

## 2018-03-31 DIAGNOSIS — Z3A19 19 weeks gestation of pregnancy: Secondary | ICD-10-CM

## 2018-03-31 DIAGNOSIS — Z8751 Personal history of pre-term labor: Secondary | ICD-10-CM

## 2018-03-31 DIAGNOSIS — Z1389 Encounter for screening for other disorder: Secondary | ICD-10-CM

## 2018-03-31 DIAGNOSIS — Z331 Pregnant state, incidental: Secondary | ICD-10-CM

## 2018-03-31 LAB — POCT URINALYSIS DIPSTICK
Blood, UA: NEGATIVE
GLUCOSE UA: NEGATIVE
Ketones, UA: NEGATIVE
LEUKOCYTES UA: NEGATIVE
Nitrite, UA: NEGATIVE
Protein, UA: NEGATIVE

## 2018-03-31 MED ORDER — HYDROXYPROGESTERONE CAPROATE 275 MG/1.1ML ~~LOC~~ SOAJ
275.0000 mg | SUBCUTANEOUS | Status: DC
  Administered 2018-03-31: 275 mg via SUBCUTANEOUS

## 2018-03-31 NOTE — Progress Notes (Signed)
Pt given Makena 275g SQ right arm without complications. Advised to return in 1 week for next injection.

## 2018-04-05 ENCOUNTER — Telehealth: Payer: Self-pay | Admitting: Women's Health

## 2018-04-05 NOTE — Telephone Encounter (Signed)
Patient called stating that she would like to speak with a nurse or someone regarding her 17P shots. Pt would like to stop it. Please contact pt

## 2018-04-05 NOTE — Telephone Encounter (Signed)
Pt reports that since taking the Makena injections she is having itching and red spots on her forearm and stomach. She does not want to take them anymore. She has tried benadryl to help but it doesn't help a lot. Advised that I would pass her message along to [facility] and let her know of any recommendations.

## 2018-04-06 NOTE — Telephone Encounter (Signed)
Advised patient that Selena Batten said she can use hydrocortisone cream for the itching. Pt prefers to no longer receive injections. I told her that I would let the front desk know to cancel future 17 P shots.

## 2018-04-07 ENCOUNTER — Ambulatory Visit: Payer: PRIVATE HEALTH INSURANCE

## 2018-04-12 ENCOUNTER — Telehealth: Payer: Self-pay | Admitting: Women's Health

## 2018-04-12 NOTE — Telephone Encounter (Signed)
Attempted to call patient back. Left message that I am returning her call.  

## 2018-04-12 NOTE — Telephone Encounter (Signed)
Patient called stating that she went to the ER because she was not feeling well and she got diagnosed with Bronchitis. Pt was given medication and was told to follow up with us. I made patient appointment for Friday but pt would like a call back from either the doctor or a nurse to see what else she can do for it. 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

## 2018-04-12 NOTE — Telephone Encounter (Signed)
Pt reports that she was treated for bronchitis in emergency room. She was given augmentin. She wanted to see if we can prescribe anything for her cough. She has a visit with Dr. Emelda FearFerguson on Friday. Advised patient I will send this message to him to see if he can give her anything else.

## 2018-04-13 MED ORDER — DEXTROMETHORPHAN HBR 15 MG/5ML PO SYRP: 10.0000 mL | ORAL_SOLUTION | Freq: Four times a day (QID) | ORAL | 0 refills | Status: DC | PRN

## 2018-04-13 MED ORDER — HYDROCODONE-HOMATROPINE 5-1.5 MG/5ML PO SYRP: 5.0000 mL | ORAL_SOLUTION | Freq: Four times a day (QID) | ORAL | 0 refills | Status: DC | PRN

## 2018-04-13 NOTE — Telephone Encounter (Signed)
PLACED ON ROBITUSSIN DM

## 2018-04-14 ENCOUNTER — Encounter: Payer: Self-pay | Admitting: Obstetrics and Gynecology

## 2018-04-14 ENCOUNTER — Ambulatory Visit (INDEPENDENT_AMBULATORY_CARE_PROVIDER_SITE_OTHER): Payer: PRIVATE HEALTH INSURANCE | Admitting: Obstetrics and Gynecology

## 2018-04-14 ENCOUNTER — Other Ambulatory Visit: Payer: PRIVATE HEALTH INSURANCE

## 2018-04-14 ENCOUNTER — Ambulatory Visit: Payer: PRIVATE HEALTH INSURANCE

## 2018-04-14 VITALS — BP 104/60 | HR 83 | Wt 193.0 lb

## 2018-04-14 DIAGNOSIS — Z3402 Encounter for supervision of normal first pregnancy, second trimester: Secondary | ICD-10-CM

## 2018-04-14 DIAGNOSIS — Z3A21 21 weeks gestation of pregnancy: Secondary | ICD-10-CM

## 2018-04-14 DIAGNOSIS — Z3482 Encounter for supervision of other normal pregnancy, second trimester: Secondary | ICD-10-CM

## 2018-04-14 DIAGNOSIS — Z331 Pregnant state, incidental: Secondary | ICD-10-CM

## 2018-04-14 DIAGNOSIS — Z1389 Encounter for screening for other disorder: Secondary | ICD-10-CM

## 2018-04-14 LAB — POCT URINALYSIS DIPSTICK
Glucose, UA: NEGATIVE
Ketones, UA: NEGATIVE
LEUKOCYTES UA: NEGATIVE
Nitrite, UA: NEGATIVE
Protein, UA: NEGATIVE
RBC UA: NEGATIVE

## 2018-04-14 NOTE — Progress Notes (Signed)
Patient ID: Katherine NoonKatelynn Swarm, female   DOB: 05-11-1991, 27 y.o.   MRN: [id number]    LOW-RISK PREGNANCY VISIT Patient name: Katherine Villegas MRN [id number]  Date of birth: 05-11-1991 Chief Complaint:   low risk ob (not taking 17p any more)  History of Present Illness:   Katherine NoonKatelynn Laughery is a 27 y.o. [id number] female at 15106w2d with an Estimated Date of Delivery: 08/23/18 being seen today for ongoing management of a low-risk pregnancy. Pt had a persistent cough and  tightness in her chest 2 days ago but since taking medicine it has gotten better. [facility] and started on Delsym and Augmentin after 5 d of cough and d/c Today she reports no complaints. Contractions: Not present.  .  Movement: Present. denies leaking of fluid. Review of Systems:   Pertinent items are noted in HPI Denies abnormal vaginal discharge w/ itching/odor/irritation, headaches, visual changes, shortness of breath, chest pain, abdominal pain, severe nausea/vomiting, or problems with urination or bowel movements unless otherwise stated above. Pertinent History Reviewed:  Reviewed past medical,surgical, social, obstetrical and family history.  Reviewed problem list, medications and allergies. Physical Assessment:   Vitals:   04/14/18 1001  BP: 104/60  Pulse: 83  Weight: 193 lb (87.5 kg)  Body mass index is 34.19 kg/m.        Physical Examination:   General appearance: Well appearing, and in no distress  Mental status: Alert, oriented to person, place, and time  Skin: Warm & dry  Cardiovascular: Normal heart rate noted  Respiratory: Normal respiratory effort, no distress  Abdomen: Soft, gravid, nontender  Pelvic: Cervical exam deferred         Extremities: Edema: None  Fetal Status: Fetal Heart Rate (bpm): 140 doppler Fundal Height: 21 cm Movement: Present    Results for orders placed or performed in visit on 04/14/18 (from the past 24 hour(s))  POCT urinalysis dipstick   Collection Time: 04/14/18 10:07 AM   Result Value Ref Range   Color, UA     Clarity, UA     Glucose, UA Negative Negative   Bilirubin, UA     Ketones, UA neg    Spec Grav, UA  1.010 - 1.025   Blood, UA neg    pH, UA  5.0 - 8.0   Protein, UA Negative Negative   Urobilinogen, UA  0.2 or 1.0 E.U./dL   Nitrite, UA neg    Leukocytes, UA Negative Negative   Appearance     Odor      Assessment & Plan:  1) Low-risk pregnancy [id number] at 55106w2d with an Estimated Date of Delivery: 08/23/18   Meds: No orders of the defined types were placed in this encounter.  Labs/procedures today: none  Plan:  Continue routine obstetrical care  Continue Delsym cough medicine& Augmentin   Follow-up: Return in about 1 month (around 05/12/2018) for LROB, NP2.  Orders Placed This Encounter  Procedures  . POCT urinalysis dipstick   By signing my name below, I, Arnette NorrisMari Johnson, attest that this documentation has been prepared under the direction and in the presence of Tilda BurrowFerguson, Lizette Pazos V, MD. Electronically Signed: Arnette NorrisMari Johnson Medical Scribe. 04/14/18. 10:34 AM.  I personally performed the services described in this documentation, which was SCRIBED in my presence. The recorded information has been reviewed and considered accurate. It has been edited as necessary during review. Tilda BurrowJohn V Loida Calamia, MD

## 2018-04-21 ENCOUNTER — Encounter: Payer: PRIVATE HEALTH INSURANCE | Admitting: Women's Health

## 2018-05-12 ENCOUNTER — Other Ambulatory Visit: Payer: PRIVATE HEALTH INSURANCE

## 2018-05-12 ENCOUNTER — Ambulatory Visit (INDEPENDENT_AMBULATORY_CARE_PROVIDER_SITE_OTHER): Payer: PRIVATE HEALTH INSURANCE | Admitting: Obstetrics & Gynecology

## 2018-05-12 ENCOUNTER — Encounter: Payer: Self-pay | Admitting: Obstetrics & Gynecology

## 2018-05-12 VITALS — BP 102/64 | HR 96 | Wt 203.5 lb

## 2018-05-12 DIAGNOSIS — Z331 Pregnant state, incidental: Secondary | ICD-10-CM

## 2018-05-12 DIAGNOSIS — Z131 Encounter for screening for diabetes mellitus: Secondary | ICD-10-CM

## 2018-05-12 DIAGNOSIS — Z3A25 25 weeks gestation of pregnancy: Secondary | ICD-10-CM

## 2018-05-12 DIAGNOSIS — Z3482 Encounter for supervision of other normal pregnancy, second trimester: Secondary | ICD-10-CM

## 2018-05-12 DIAGNOSIS — Z1389 Encounter for screening for other disorder: Secondary | ICD-10-CM

## 2018-05-12 DIAGNOSIS — Z3402 Encounter for supervision of normal first pregnancy, second trimester: Secondary | ICD-10-CM

## 2018-05-12 LAB — POCT URINALYSIS DIPSTICK
Glucose, UA: NEGATIVE
Ketones, UA: NEGATIVE
NITRITE UA: NEGATIVE
PROTEIN UA: NEGATIVE

## 2018-05-12 NOTE — Progress Notes (Signed)
[id number] 4453w2d Estimated Date of Delivery: 08/23/18  Blood pressure 102/64, pulse 96, weight 203 lb 8 oz (92.3 kg), last menstrual period 11/16/2017, not currently breastfeeding.   BP weight and urine results all reviewed and noted.  Please refer to the obstetrical flow sheet for the fundal height and fetal heart rate documentation:  Patient reports good fetal movement, denies any bleeding and no rupture of membranes symptoms or regular contractions. Patient is without complaints. All questions were answered.  Orders Placed This Encounter  Procedures  . POCT urinalysis dipstick    Plan:  Continued routine obstetrical care, PN2 today  Return in about 1 month (around 06/09/2018) for LROB.

## 2018-05-13 LAB — GLUCOSE TOLERANCE, 2 HOURS W/ 1HR
Glucose, 1 hour: 162 mg/dL (ref 65–179)
Glucose, 2 hour: 151 mg/dL (ref 65–152)
Glucose, Fasting: 88 mg/dL (ref 65–91)

## 2018-05-13 LAB — CBC
Hematocrit: 34.1 % (ref 34.0–46.6)
Hemoglobin: 11.9 g/dL (ref 11.1–15.9)
MCH: 30.3 pg (ref 26.6–33.0)
MCHC: 34.9 g/dL (ref 31.5–35.7)
MCV: 87 fL (ref 79–97)
PLATELETS: 162 10*3/uL (ref 150–450)
RBC: 3.93 x10E6/uL (ref 3.77–5.28)
RDW: 14 % (ref 12.3–15.4)
WBC: 10.8 10*3/uL (ref 3.4–10.8)

## 2018-05-13 LAB — HIV ANTIBODY (ROUTINE TESTING W REFLEX): HIV SCREEN 4TH GENERATION: NONREACTIVE

## 2018-05-13 LAB — RPR: RPR: NONREACTIVE

## 2018-05-13 LAB — ANTIBODY SCREEN: Antibody Screen: NEGATIVE

## 2018-06-08 ENCOUNTER — Ambulatory Visit: Payer: PRIVATE HEALTH INSURANCE | Admitting: Neurology

## 2018-06-09 ENCOUNTER — Ambulatory Visit (INDEPENDENT_AMBULATORY_CARE_PROVIDER_SITE_OTHER): Payer: PRIVATE HEALTH INSURANCE | Admitting: Women's Health

## 2018-06-09 ENCOUNTER — Encounter: Payer: Self-pay | Admitting: Women's Health

## 2018-06-09 VITALS — BP 113/66 | HR 81 | Wt 211.0 lb

## 2018-06-09 DIAGNOSIS — Z1389 Encounter for screening for other disorder: Secondary | ICD-10-CM

## 2018-06-09 DIAGNOSIS — Z23 Encounter for immunization: Secondary | ICD-10-CM

## 2018-06-09 DIAGNOSIS — Z3A29 29 weeks gestation of pregnancy: Secondary | ICD-10-CM

## 2018-06-09 DIAGNOSIS — Z3483 Encounter for supervision of other normal pregnancy, third trimester: Secondary | ICD-10-CM

## 2018-06-09 DIAGNOSIS — Z331 Pregnant state, incidental: Secondary | ICD-10-CM

## 2018-06-09 LAB — POCT URINALYSIS DIPSTICK OB
Blood, UA: NEGATIVE
GLUCOSE, UA: NEGATIVE — AB
Ketones, UA: NEGATIVE
LEUKOCYTES UA: NEGATIVE
Nitrite, UA: NEGATIVE
POC,PROTEIN,UA: NEGATIVE

## 2018-06-09 NOTE — Patient Instructions (Signed)
Katherine Villegas, I greatly value your feedback.  If you receive a survey following your visit with [place] today, we appreciate you taking the time to fill it out.  Thanks, [name], CNM, WHNP-BC   Call the office [phone]) or go to [facility] if:  You begin to have strong, frequent contractions  Your water breaks.  Sometimes it is a big gush of fluid, sometimes it is just a trickle that keeps getting your panties wet or running down your legs  You have vaginal bleeding.  It is normal to have a small amount of spotting if your cervix was checked.   You don't feel your baby moving like normal.  If you don't, get you something to eat and drink and lay down and focus on feeling your baby move.  You should feel at least 10 movements in 2 hours.  If you don't, you should call the office or go to [facility].    Tdap Vaccine  It is recommended that you get the Tdap vaccine during the third trimester of EACH pregnancy to help protect your baby from getting pertussis (whooping cough)  27-36 weeks is the BEST time to do this so that you can pass the protection on to your baby. During pregnancy is better than after pregnancy, but if you are unable to get it during pregnancy it will be offered at the hospital.   You can get this vaccine with [place], at the health department, your family doctor, or some local pharmacies  Everyone who will be around your baby should also be up-to-date on their vaccines before the baby comes. Adults (who are not pregnant) only need 1 dose of Tdap during adulthood.   For Headaches:   Stay well hydrated, drink enough water so that your urine is clear, sometimes if you are dehydrated you can get headaches  Eat small frequent meals and snacks, sometimes if you are hungry you can get headaches  Sometimes you get headaches during pregnancy from the pregnancy hormones  You can try tylenol (1-2 regular strength 325mg  or 1-2 extra strength 500mg ) as directed on the box.  The least amount of medication that works is best.   Cool compresses (cool wet washcloth or ice pack) to area of head that is hurting  You can also try drinking a caffeinated drink to see if this will help  If not helping, try below:  For Prevention of Headaches/Migraines:  CoQ10 100mg  three times daily  Vitamin B2 400mg  daily  Magnesium Oxide 400-600mg  daily  If You Get a Bad Headache/Migraine:  Benadryl 25mg    Magnesium Oxide  1 large Gatorade  2 extra strength Tylenol (1,000mg  total)  1 cup coffee or Coke  If this doesn't help please call us @ [phone]    Third Trimester of Pregnancy The third trimester is from week 29 through week 42, months 7 through 9. The third trimester is a time when the fetus is growing rapidly. At the end of the ninth month, the fetus is about 20 inches in length and weighs 6-10 pounds.  BODY CHANGES Your body goes through many changes during pregnancy. The changes vary from woman to woman.   Your weight will continue to increase. You can expect to gain 25-35 pounds (11-16 kg) by the end of the pregnancy.  You may begin to get stretch marks on your hips, abdomen, and breasts.  You may urinate more often because the fetus is moving lower into your pelvis and pressing on your  bladder.  You may develop or continue to have heartburn as a result of your pregnancy.  You may develop constipation because certain hormones are causing the muscles that push waste through your intestines to slow down.  You may develop hemorrhoids or swollen, bulging veins (varicose veins).  You may have pelvic pain because of the weight gain and pregnancy hormones relaxing your joints between the bones in your pelvis. Backaches may result from overexertion of the muscles supporting your posture.  You may have changes in your hair. These can include thickening of your hair, rapid growth, and changes in texture. Some women also have hair loss during or after  pregnancy, or hair that feels dry or thin. Your hair will most likely return to normal after your baby is born.  Your breasts will continue to grow and be tender. A yellow discharge may leak from your breasts called colostrum.  Your belly button may stick out.  You may feel short of breath because of your expanding uterus.  You may notice the fetus "dropping," or moving lower in your abdomen.  You may have a bloody mucus discharge. This usually occurs a few days to a week before labor begins.  Your cervix becomes thin and soft (effaced) near your due date. WHAT TO EXPECT AT YOUR PRENATAL EXAMS  You will have prenatal exams every 2 weeks until week 36. Then, you will have weekly prenatal exams. During a routine prenatal visit:  You will be weighed to make sure you and the fetus are growing normally.  Your blood pressure is taken.  Your abdomen will be measured to track your baby's growth.  The fetal heartbeat will be listened to.  Any test results from the previous visit will be discussed.  You may have a cervical check near your due date to see if you have effaced. At around 36 weeks, your caregiver will check your cervix. At the same time, your caregiver will also perform a test on the secretions of the vaginal tissue. This test is to determine if a type of bacteria, Group B streptococcus, is present. Your caregiver will explain this further. Your caregiver may ask you:  What your birth plan is.  How you are feeling.  If you are feeling the baby move.  If you have had any abnormal symptoms, such as leaking fluid, bleeding, severe headaches, or abdominal cramping.  If you have any questions. Other tests or screenings that may be performed during your third trimester include:  Blood tests that check for low iron levels (anemia).  Fetal testing to check the health, activity level, and growth of the fetus. Testing is done if you have certain medical conditions or if there are  problems during the pregnancy. FALSE LABOR You may feel small, irregular contractions that eventually go away. These are called Braxton Hicks contractions, or false labor. Contractions may last for hours, days, or even weeks before true labor sets in. If contractions come at regular intervals, intensify, or become painful, it is best to be seen by your caregiver.  SIGNS OF LABOR   Menstrual-like cramps.  Contractions that are 5 minutes apart or less.  Contractions that start on the top of the uterus and spread down to the lower abdomen and back.  A sense of increased pelvic pressure or back pain.  A watery or bloody mucus discharge that comes from the vagina. If you have any of these signs before the 37th week of pregnancy, call your caregiver right away. You  need to go to the hospital to get checked immediately. HOME CARE INSTRUCTIONS   Avoid all smoking, herbs, alcohol, and unprescribed drugs. These chemicals affect the formation and growth of the baby.  Follow your caregiver's instructions regarding medicine use. There are medicines that are either safe or unsafe to take during pregnancy.  Exercise only as directed by your caregiver. Experiencing uterine cramps is a good sign to stop exercising.  Continue to eat regular, healthy meals.  Wear a good support bra for breast tenderness.  Do not use hot tubs, steam rooms, or saunas.  Wear your seat belt at all times when driving.  Avoid raw meat, uncooked cheese, cat litter boxes, and soil used by cats. These carry germs that can cause birth defects in the baby.  Take your prenatal vitamins.  Try taking a stool softener (if your caregiver approves) if you develop constipation. Eat more high-fiber foods, such as fresh vegetables or fruit and whole grains. Drink plenty of fluids to keep your urine clear or pale yellow.  Take warm sitz baths to soothe any pain or discomfort caused by hemorrhoids. Use hemorrhoid cream if your caregiver  approves.  If you develop varicose veins, wear support hose. Elevate your feet for 15 minutes, 3-4 times a day. Limit salt in your diet.  Avoid heavy lifting, wear low heal shoes, and practice good posture.  Rest a lot with your legs elevated if you have leg cramps or low back pain.  Visit your dentist if you have not gone during your pregnancy. Use a soft toothbrush to brush your teeth and be gentle when you floss.  A sexual relationship may be continued unless your caregiver directs you otherwise.  Do not travel far distances unless it is absolutely necessary and only with the approval of your caregiver.  Take prenatal classes to understand, practice, and ask questions about the labor and delivery.  Make a trial run to the hospital.  Pack your hospital bag.  Prepare the baby's nursery.  Continue to go to all your prenatal visits as directed by your caregiver. SEEK MEDICAL CARE IF:  You are unsure if you are in labor or if your water has broken.  You have dizziness.  You have mild pelvic cramps, pelvic pressure, or nagging pain in your abdominal area.  You have persistent nausea, vomiting, or diarrhea.  You have a bad smelling vaginal discharge.  You have pain with urination. SEEK IMMEDIATE MEDICAL CARE IF:   You have a fever.  You are leaking fluid from your vagina.  You have spotting or bleeding from your vagina.  You have severe abdominal cramping or pain.  You have rapid weight loss or gain.  You have shortness of breath with chest pain.  You notice sudden or extreme swelling of your face, hands, ankles, feet, or legs.  You have not felt your baby move in over an hour.  You have severe headaches that do not go away with medicine.  You have vision changes. Document Released: 10/19/2001 Document Revised: 10/30/2013 Document Reviewed: 12/26/2012 [employer] Patient Information 2015 [place], [place]. This information is not intended to replace advice given to  you by your health care provider. Make sure you discuss any questions you have with your health care provider.

## 2018-06-09 NOTE — Progress Notes (Signed)
LOW-RISK PREGNANCY VISIT Patient name: Katherine Villegas MRN [id number]  Date of birth: [birth year] Chief Complaint:   Routine Prenatal Visit (headache x3 days, heartburn, vaginal pressure)  History of Present Illness:   Katherine Villegas is a 27 y.o. [id number] female at [redacted]w[redacted]d with an Estimated Date of Delivery: 08/23/18 being seen today for ongoing management of a low-risk pregnancy.  Today she reports frontal ha x 3 days, 2 reg strength apap doesn't help. Heartburn, hasn't tried TUMS b/c she would 'eat the whole bottle', doesn't want daily meds. Vaginal pressure, does have h/o 32wk PPROM so would like to be examined, doesn't 'feel' like her body did last time. Contractions: Not present. Vag. Bleeding: None.  Movement: Present. denies leaking of fluid. Review of Systems:   Pertinent items are noted in HPI Denies abnormal vaginal discharge w/ itching/odor/irritation, headaches, visual changes, shortness of breath, chest pain, abdominal pain, severe nausea/vomiting, or problems with urination or bowel movements unless otherwise stated above. Pertinent History Reviewed:  Reviewed past medical,surgical, social, obstetrical and family history.  Reviewed problem list, medications and allergies. Physical Assessment:   Vitals:   06/09/18 0952  BP: 113/66  Pulse: 81  Weight: 211 lb (95.7 kg)  Body mass index is 37.38 kg/m.        Physical Examination:   General appearance: Well appearing, and in no distress  Mental status: Alert, oriented to person, place, and time  Skin: Warm & dry  Cardiovascular: Normal heart rate noted  Respiratory: Normal respiratory effort, no distress  Abdomen: Soft, gravid, nontender  Pelvic: cx visually long and closed, SVE deferred         Extremities: Edema: Trace  Fetal Status:     Movement: Present    Results for orders placed or performed in visit on 06/09/18 (from the past 24 hour(s))  POC Urinalysis Dipstick OB   Collection Time: 06/09/18  9:53 AM  Result  Value Ref Range   Color, UA     Clarity, UA     Glucose, UA Negative (A) (none)   Bilirubin, UA     Ketones, UA neg    Spec Grav, UA  1.010 - 1.025   Blood, UA neg    pH, UA  5.0 - 8.0   POC Protein UA Negative Negative, Trace   Urobilinogen, UA  0.2 or 1.0 E.U./dL   Nitrite, UA neg    Leukocytes, UA Negative Negative   Appearance     Odor      Assessment & Plan:  1) Low-risk pregnancy [id number] at [redacted]w[redacted]d with an Estimated Date of Delivery: 08/23/18   2) H/O 34wk PTB d/t 32wk PPROM, had to stop Makena d/t rash, reviewed ptl s/s  3) Prev c/s then VBAC, desires VBAC> consent previously signed 5/17, do not see scanned in, re-signed today  4) Desires BTL> discussed risks/benefits, high incidence of regret <30yo, LARCs just as effective, still wants BTL, consent signed today  5) Headaches> gave printed prevention/relief measures   6) Heartburn> declines TUMS/meds   Meds: No orders of the defined types were placed in this encounter.  Labs/procedures today: spec exam, tdap  Plan:  Continue routine obstetrical care   Reviewed: Preterm labor symptoms and general obstetric precautions including but not limited to vaginal bleeding, contractions, leaking of fluid and fetal movement were reviewed in detail with the patient.  All questions were answered  Follow-up: Return in about 2 weeks (around 06/23/2018) for LROB, Sign BTL consent today.  Orders Placed This Encounter  Procedures  .  Tdap vaccine greater than or equal to 7yo IM  . POC Urinalysis Dipstick OB   Cheral MarkerKimberly R Lavance Beazer CNM, [facility] NorthWHNP-BC 06/09/2018 10:33 AM

## 2018-06-23 ENCOUNTER — Ambulatory Visit (INDEPENDENT_AMBULATORY_CARE_PROVIDER_SITE_OTHER): Payer: PRIVATE HEALTH INSURANCE | Admitting: Obstetrics and Gynecology

## 2018-06-23 VITALS — BP 107/66 | HR 104 | Wt 212.0 lb

## 2018-06-23 DIAGNOSIS — Z302 Encounter for sterilization: Secondary | ICD-10-CM | POA: Insufficient documentation

## 2018-06-23 DIAGNOSIS — Z331 Pregnant state, incidental: Secondary | ICD-10-CM

## 2018-06-23 DIAGNOSIS — Z3483 Encounter for supervision of other normal pregnancy, third trimester: Secondary | ICD-10-CM

## 2018-06-23 DIAGNOSIS — Z3A31 31 weeks gestation of pregnancy: Secondary | ICD-10-CM

## 2018-06-23 DIAGNOSIS — O34219 Maternal care for unspecified type scar from previous cesarean delivery: Secondary | ICD-10-CM

## 2018-06-23 DIAGNOSIS — Z1389 Encounter for screening for other disorder: Secondary | ICD-10-CM

## 2018-06-23 LAB — POCT URINALYSIS DIPSTICK OB
Blood, UA: NEGATIVE
Glucose, UA: NEGATIVE — AB
KETONES UA: NEGATIVE
Leukocytes, UA: NEGATIVE
Nitrite, UA: NEGATIVE
POC,PROTEIN,UA: NEGATIVE

## 2018-06-23 NOTE — Progress Notes (Signed)
Patient ID: Katherine NoonKatelynn Kernodle, female   DOB: 1991-08-10, 27 y.o.   MRN: [id number]    LOW-RISK PREGNANCY VISIT Patient name: Katherine Villegas MRN [id number]  Date of birth: 1991-08-10 Chief Complaint:   Routine Prenatal Visit  History of Present Illness:   Katherine Villegas is a 27 y.o. [id number] female at 8925w2d with an Estimated Date of Delivery: 08/23/18 being seen today for ongoing management of a low-risk pregnancy, TOLAC, desires in hospital BTL tubal. Today she reports some aches and pains when she eats or the baby moves. BPs are good. Her first child was a C section and the second was vaginal with an epidural.    Contractions: Not present. Vag. Bleeding: None.  Movement: Present. denies leaking of fluid. Review of Systems:   Pertinent items are noted in HPI Denies abnormal vaginal discharge w/ itching/odor/irritation, headaches, visual changes, shortness of breath, chest pain, abdominal pain, severe nausea/vomiting, or problems with urination or bowel movements unless otherwise stated above. Pertinent History Reviewed:  Reviewed past medical,surgical, social, obstetrical and family history.  Reviewed problem list, medications and allergies. Physical Assessment:   Vitals:   06/23/18 0836  BP: 107/66  Pulse: (!) 104  Weight: 212 lb (96.2 kg)  Body mass index is 37.55 kg/m.        Physical Examination:   General appearance: Well appearing, and in no distress  Mental status: Alert, oriented to person, place, and time  Skin: Warm & dry  Cardiovascular: Normal heart rate noted  Respiratory: Normal respiratory effort, no distress  Abdomen: Soft, gravid, nontender  Pelvic: Cervical exam deferred         Extremities: Edema: Trace  Fetal Status: Fetal Heart Rate (bpm): 145  FH 32  Movement: Present    Results for orders placed or performed in visit on 06/23/18 (from the past 24 hour(s))  POC Urinalysis Dipstick OB   Collection Time: 06/23/18  8:40 AM  Result Value Ref Range   Color, UA      Clarity, UA     Glucose, UA Negative (A) (none)   Bilirubin, UA     Ketones, UA neg    Spec Grav, UA     Blood, UA neg    pH, UA     POC Protein UA Negative Negative, Trace   Urobilinogen, UA     Nitrite, UA neg    Leukocytes, UA Negative Negative   Appearance     Odor      Assessment & Plan:  1) Low-risk pregnancy [id number] at 6625w2d with an Estimated Date of Delivery: 08/23/18   2) Desires BTL in hospital: BTL consent papers have been signed   Meds: No orders of the defined types were placed in this encounter.  Labs/procedures today: none  Plan:  Continue routine obstetrical care  Follow-up: Return in about 2 weeks (around 07/07/2018) for LROB.  Orders Placed This Encounter  Procedures  . POC Urinalysis Dipstick OB   By signing my name below, I, Pietro CassisEmily Tufford, attest that this documentation has been prepared under the direction and in the presence of Tilda BurrowFerguson, Shaleta Ruacho V, MD. Electronically Signed: Pietro CassisEmily Tufford, Medical Scribe. 06/23/18. 8:50 AM.  I personally performed the services described in this documentation, which was SCRIBED in my presence. The recorded information has been reviewed and considered accurate. It has been edited as necessary during review. Tilda BurrowJohn V Herman Mell, MD

## 2018-07-07 ENCOUNTER — Ambulatory Visit (INDEPENDENT_AMBULATORY_CARE_PROVIDER_SITE_OTHER): Payer: PRIVATE HEALTH INSURANCE | Admitting: Obstetrics and Gynecology

## 2018-07-07 ENCOUNTER — Encounter: Payer: Self-pay | Admitting: Obstetrics and Gynecology

## 2018-07-07 VITALS — BP 105/71 | HR 106 | Wt 216.4 lb

## 2018-07-07 DIAGNOSIS — Z331 Pregnant state, incidental: Secondary | ICD-10-CM

## 2018-07-07 DIAGNOSIS — Z1389 Encounter for screening for other disorder: Secondary | ICD-10-CM

## 2018-07-07 DIAGNOSIS — Z3A33 33 weeks gestation of pregnancy: Secondary | ICD-10-CM

## 2018-07-07 DIAGNOSIS — Z3483 Encounter for supervision of other normal pregnancy, third trimester: Secondary | ICD-10-CM

## 2018-07-07 LAB — POCT URINALYSIS DIPSTICK OB
Glucose, UA: NEGATIVE
KETONES UA: NEGATIVE
Leukocytes, UA: NEGATIVE
NITRITE UA: NEGATIVE
PROTEIN: NEGATIVE
RBC UA: NEGATIVE

## 2018-07-07 NOTE — Progress Notes (Signed)
Patient ID: Katherine Villegas, female   DOB: 1991/06/03, 27 y.o.   MRN: [id number]    LOW-RISK PREGNANCY VISIT Patient name: Katherine Villegas MRN [id number]  Date of birth: 1991/06/03 Chief Complaint:   Routine Prenatal Visit  History of Present Illness:   Katherine Villegas Hopkinson is a 27 y.o. [id number] female at 6661w2d with an Estimated Date of Delivery: 08/23/18 being seen today for ongoing management of a low-risk pregnancy.[name] had previous c-section and 1 VBAC. Has signed her tubal papers. Today she reports no complaints. Contractions: Not present.  .  Movement: Present. denies leaking of fluid. Review of Systems:   Pertinent items are noted in HPI Denies abnormal vaginal discharge w/ itching/odor/irritation, headaches, visual changes, shortness of breath, chest pain, abdominal pain, severe nausea/vomiting, or problems with urination or bowel movements unless otherwise stated above. Pertinent History Reviewed:  Reviewed past medical,surgical, social, obstetrical and family history.  Reviewed problem list, medications and allergies. Physical Assessment:   Vitals:   07/07/18 0836  BP: 105/71  Pulse: (!) 106  Weight: 216 lb 6.4 oz (98.2 kg)  Body mass index is 38.33 kg/m.        Physical Examination:   General appearance: Well appearing, and in no distress  Mental status: Alert, oriented to person, place, and time  Skin: Warm & dry  Cardiovascular: Normal heart rate noted  Respiratory: Normal respiratory effort, no distress  Abdomen: Soft, gravid, nontender  Pelvic: Cervical exam deferred         Extremities: Edema: Trace  Fetal Status: Fetal Heart Rate (bpm): 161 Fundal Height: 36 cm Movement: Present    Results for orders placed or performed in visit on 07/07/18 (from the past 24 hour(s))  POC Urinalysis Dipstick OB   Collection Time: 07/07/18  8:40 AM  Result Value Ref Range   Color, UA     Clarity, UA     Glucose, UA Negative Negative   Bilirubin, UA     Ketones, UA neg    Spec Grav,  UA     Blood, UA neg    pH, UA     POC Protein UA Negative Negative, Trace   Urobilinogen, UA     Nitrite, UA neg    Leukocytes, UA Negative Negative   Appearance     Odor      Assessment & Plan:  1) Low-risk pregnancy [id number] at 2361w2d with an Estimated Date of Delivery: 08/23/18     Meds: No orders of the defined types were placed in this encounter.  Labs/procedures today: None  Plan:  1. F/u in 2 weeks LROB 2. Continue routine obstetrical care   Follow-up: Return in about 2 weeks (around 07/21/2018) for LROB.  Orders Placed This Encounter  Procedures  . POC Urinalysis Dipstick OB   By signing my name below, I, Arnette NorrisMari Johnson, attest that this documentation has been prepared under the direction and in the presence of Tilda BurrowFerguson, Emmalene Kattner V, MD. Electronically Signed: Arnette NorrisMari Johnson Medical Scribe. 07/07/18. 9:04 AM.  I personally performed the services described in this documentation, which was SCRIBED in my presence. The recorded information has been reviewed and considered accurate. It has been edited as necessary during review. Tilda BurrowJohn V Gjon Letarte, MD

## 2018-07-21 ENCOUNTER — Ambulatory Visit (INDEPENDENT_AMBULATORY_CARE_PROVIDER_SITE_OTHER): Payer: PRIVATE HEALTH INSURANCE | Admitting: Obstetrics and Gynecology

## 2018-07-21 ENCOUNTER — Encounter: Payer: Self-pay | Admitting: Obstetrics and Gynecology

## 2018-07-21 VITALS — BP 112/74 | HR 96 | Wt 214.6 lb

## 2018-07-21 DIAGNOSIS — Z3A35 35 weeks gestation of pregnancy: Secondary | ICD-10-CM

## 2018-07-21 DIAGNOSIS — Z331 Pregnant state, incidental: Secondary | ICD-10-CM

## 2018-07-21 DIAGNOSIS — Z1389 Encounter for screening for other disorder: Secondary | ICD-10-CM

## 2018-07-21 DIAGNOSIS — Z3483 Encounter for supervision of other normal pregnancy, third trimester: Secondary | ICD-10-CM

## 2018-07-21 LAB — POCT URINALYSIS DIPSTICK OB
GLUCOSE, UA: NEGATIVE
LEUKOCYTES UA: NEGATIVE
Nitrite, UA: NEGATIVE
RBC UA: NEGATIVE

## 2018-07-21 NOTE — Progress Notes (Signed)
Patient ID: Katherine Villegas, female   DOB: 06-14-1991, 27 y.o.   MRN: [id number]    LOW-RISK PREGNANCY VISIT Patient name: Katherine Villegas MRN [id number]  Date of birth: 06-14-1991 Chief Complaint:   Routine Prenatal Visit (stop taking 17p injection)  History of Present Illness:   Katherine Villegas is a 27 y.o. [id number] female at 4533w2d with an Estimated Date of Delivery: 08/23/18 being seen today for ongoing management of a low-risk pregnancy. Has stopped receiving 17p injection at 21 weeks. Has headaches every day and sleeps 12 hours and still has to take a nap during the day Today she reports fatigue and headache. Contractions: Irregular.  .  Movement: Present. denies leaking of fluid. Review of Systems:   Pertinent items are noted in HPI Denies abnormal vaginal discharge w/ itching/odor/irritation, headaches, visual changes, shortness of breath, chest pain, abdominal pain, severe nausea/vomiting, or problems with urination or bowel movements unless otherwise stated above. Pertinent History Reviewed:  Reviewed past medical,surgical, social, obstetrical and family history.  Reviewed problem list, medications and allergies. Physical Assessment:   Vitals:   07/21/18 0827  BP: 112/74  Pulse: 96  Weight: 214 lb 9.6 oz (97.3 kg)  Body mass index is 38.01 kg/m.        Physical Examination:   General appearance: Well appearing, and in no distress  Mental status: Alert, oriented to person, place, and time  Skin: Warm & dry  Cardiovascular: Normal heart rate noted  Respiratory: Normal respiratory effort, no distress  Abdomen: Soft, gravid, nontender  Pelvic: Cervical exam deferred         Extremities: Edema: Trace  Fetal Status: Fetal Heart Rate (bpm): 142 Fundal Height: 36 cm Movement: Present    Results for orders placed or performed in visit on 07/21/18 (from the past 24 hour(s))  POC Urinalysis Dipstick OB   Collection Time: 07/21/18  8:37 AM  Result Value Ref Range   Color, UA     Clarity, UA     Glucose, UA Negative Negative   Bilirubin, UA     Ketones, UA small    Spec Grav, UA     Blood, UA neg    pH, UA     POC Protein UA Trace Negative, Trace   Urobilinogen, UA     Nitrite, UA neg    Leukocytes, UA Negative Negative   Appearance     Odor      Assessment & Plan:  1) Low-risk pregnancy [id number] at 3333w2d with an Estimated Date of Delivery: 08/23/18    Meds: No orders of the defined types were placed in this encounter.  Labs/procedures today: None  Plan:  Continue routine obstetrical care  F/u in 11 days for LROB   Follow-up: Return in about 11 days (around 08/01/2018) for LROB.  Orders Placed This Encounter  Procedures  . POC Urinalysis Dipstick OB   By signing my name below, I, Arnette NorrisMari Johnson, attest that this documentation has been prepared under the direction and in the presence of Tilda BurrowFerguson, Gulianna Hornsby V, MD. Electronically Signed: Arnette NorrisMari Johnson Medical Scribe. 07/21/18. 9:03 AM.  I personally performed the services described in this documentation, which was SCRIBED in my presence. The recorded information has been reviewed and considered accurate. It has been edited as necessary during review. Tilda BurrowJohn V Samanvi Cuccia, MD

## 2018-07-28 ENCOUNTER — Telehealth: Payer: Self-pay | Admitting: *Deleted

## 2018-07-28 ENCOUNTER — Inpatient Hospital Stay (HOSPITAL_COMMUNITY)
Admission: AD | Admit: 2018-07-28 | Discharge: 2018-07-28 | Disposition: A | Discharge status: Home / Self Care | Payer: PRIVATE HEALTH INSURANCE | Source: Ambulatory Visit | Attending: Obstetrics & Gynecology | Admitting: Obstetrics & Gynecology

## 2018-07-28 ENCOUNTER — Other Ambulatory Visit: Payer: Self-pay

## 2018-07-28 ENCOUNTER — Encounter (HOSPITAL_COMMUNITY): Payer: Self-pay

## 2018-07-28 DIAGNOSIS — Z880 Allergy status to penicillin: Secondary | ICD-10-CM | POA: Diagnosis not present

## 2018-07-28 DIAGNOSIS — G43909 Migraine, unspecified, not intractable, without status migrainosus: Secondary | ICD-10-CM | POA: Diagnosis not present

## 2018-07-28 DIAGNOSIS — Z3A36 36 weeks gestation of pregnancy: Secondary | ICD-10-CM | POA: Diagnosis not present

## 2018-07-28 DIAGNOSIS — O4703 False labor before 37 completed weeks of gestation, third trimester: Secondary | ICD-10-CM | POA: Diagnosis not present

## 2018-07-28 DIAGNOSIS — O99333 Smoking (tobacco) complicating pregnancy, third trimester: Secondary | ICD-10-CM | POA: Diagnosis not present

## 2018-07-28 DIAGNOSIS — O26893 Other specified pregnancy related conditions, third trimester: Secondary | ICD-10-CM | POA: Diagnosis present

## 2018-07-28 DIAGNOSIS — Z3483 Encounter for supervision of other normal pregnancy, third trimester: Secondary | ICD-10-CM

## 2018-07-28 DIAGNOSIS — F17210 Nicotine dependence, cigarettes, uncomplicated: Secondary | ICD-10-CM | POA: Insufficient documentation

## 2018-07-28 DIAGNOSIS — G43009 Migraine without aura, not intractable, without status migrainosus: Secondary | ICD-10-CM

## 2018-07-28 DIAGNOSIS — O479 False labor, unspecified: Secondary | ICD-10-CM

## 2018-07-28 LAB — URINALYSIS, ROUTINE W REFLEX MICROSCOPIC
BACTERIA UA: NONE SEEN
Bilirubin Urine: NEGATIVE
Glucose, UA: NEGATIVE mg/dL
HGB URINE DIPSTICK: NEGATIVE
Ketones, ur: 80 mg/dL — AB
NITRITE: NEGATIVE
Protein, ur: NEGATIVE mg/dL
SPECIFIC GRAVITY, URINE: 1.016 (ref 1.005–1.030)
pH: 6 (ref 5.0–8.0)

## 2018-07-28 NOTE — MAU Provider Note (Signed)
History     CSN: [id number]  Arrival date and time: 07/28/18 1158   First Provider Initiated Contact with Patient 07/28/18 1301      Chief Complaint  Patient presents with  . Headache  . Contractions  . Dizziness  . Facial Swelling  . Foot Swelling   HPI  Katherine Villegas is a 27 y.o. [id number] at [redacted]w[redacted]d who presents to MAU today with complaint of contractions, headache, blurred vision, dizziness and swelling. She states contractions started last night and resolved and then restarted this morning. They have been very painful and frequent today. She has had a headache for 9 days and blurred vision that started yesterday. She states she is "swelling all over" but denies any issues with BP in this pregnancy or otherwise.   OB History    Gravida  5   Para  2   Term  1   Preterm  1   AB  2   Living  2     SAB  2   TAB      Ectopic      Multiple  0   Live Births  2           Past Medical History:  Diagnosis Date  . Headache     Past Surgical History:  Procedure Laterality Date  . CESAREAN SECTION    . EXTERNAL EAR SURGERY    . TONSILLECTOMY    . WISDOM TOOTH EXTRACTION      Family History  Problem Relation Age of Onset  . Migraines Mother   . Hypertension Mother   . Hyperlipidemia Mother   . Alcohol abuse Mother   . Diabetes Maternal Grandfather   . Dementia Maternal Grandfather   . Alcohol abuse Maternal Grandfather   . Alcohol abuse Father   . Arthritis Maternal Grandmother   . COPD Paternal Grandmother   . Migraines Maternal Aunt   . Alcohol abuse Paternal Aunt   . Arthritis Paternal Aunt   . Arthritis Paternal Uncle     Social History   Tobacco Use  . Smoking status: Current Some Day Smoker    Packs/day: 0.25    Years: 15.00    Pack years: 3.75    Types: Cigarettes  . Smokeless tobacco: Never Used  Substance Use Topics  . Alcohol use: No    Alcohol/week: 0.0 standard drinks    Comment: rarely  . Drug use: No    Allergies:   Allergies  Allergen Reactions  . Amoxicillin Other (See Comments)    Patient states it doesn't work due to overuse.  . Latex Itching, Swelling and Rash    Facility-Administered Medications Prior to Admission  Medication Dose Route Frequency Provider Last Rate Last Dose  . HYDROXYprogesterone Caproate SOAJ 275 mg  275 mg Subcutaneous Weekly Lazaro Arms, MD   275 mg at 03/31/18 1478   Medications Prior to Admission  Medication Sig Dispense Refill Last Dose  . acetaminophen (TYLENOL) 325 MG tablet Take 650 mg by mouth every 6 (six) hours as needed for mild pain or headache.    07/27/2018 at Unknown time  . Prenatal MV & Min w/FA-DHA (PRENATAL ADULT GUMMY/DHA/FA PO) Take 2 each by mouth daily.    07/28/2018 at Unknown time  . guaifenesin (ROBITUSSIN) 100 MG/5ML syrup Take 200 mg by mouth 3 (three) times daily as needed for cough.   Taking    Review of Systems  Constitutional: Negative for fever.  Eyes: Positive for visual disturbance.  Cardiovascular: Positive for leg swelling.  Gastrointestinal: Negative for abdominal pain, constipation, diarrhea, nausea and vomiting.  Genitourinary: Negative for vaginal bleeding and vaginal discharge.  Neurological: Positive for headaches.   Physical Exam   Blood pressure 110/71, pulse 95, temperature 98.2 F (36.8 C), temperature source Oral, resp. rate 20, weight 98.1 kg, last menstrual period 11/16/2017, SpO2 99 %.  Physical Exam  Nursing note and vitals reviewed. Constitutional: She is oriented to person, place, and time. She appears well-developed and well-nourished. No distress.  HENT:  Head: Normocephalic and atraumatic.  Cardiovascular: Normal rate.  Respiratory: Effort normal.  GI: Soft. She exhibits no distension and no mass. There is no tenderness. There is no rebound and no guarding.  Musculoskeletal: She exhibits no edema.  Neurological: She is alert and oriented to person, place, and time. She has normal reflexes.  No clonus   Skin: Skin is warm and dry. No erythema.  Psychiatric: She has a normal mood and affect.   Cervical Exam: Dilation: 1 Effacement (%): 50 Cervical Position: Anterior Station: -1 Presentation: Vertex Exam by:: jaton burgess   Results for orders placed or performed during the hospital encounter of 07/28/18 (from the past 24 hour(s))  Urinalysis, Routine w reflex microscopic     Status: Abnormal   Collection Time: 07/28/18 12:26 PM  Result Value Ref Range   Color, Urine YELLOW YELLOW   APPearance HAZY (A) CLEAR   Specific Gravity, Urine 1.016 1.005 - 1.030   pH 6.0 5.0 - 8.0   Glucose, UA NEGATIVE NEGATIVE mg/dL   Hgb urine dipstick NEGATIVE NEGATIVE   Bilirubin Urine NEGATIVE NEGATIVE   Ketones, ur 80 (A) NEGATIVE mg/dL   Protein, ur NEGATIVE NEGATIVE mg/dL   Nitrite NEGATIVE NEGATIVE   Leukocytes, UA TRACE (A) NEGATIVE   RBC / HPF 0-5 0 - 5 RBC/hpf   WBC, UA 6-10 0 - 5 WBC/hpf   Bacteria, UA NONE SEEN NONE SEEN   Squamous Epithelial / LPF 6-10 0 - 5   Mucus PRESENT     MAU Course  Procedures None  MDM UA today  Patient is normotensive and without proteinuria.  Will recheck for labor progress in 1 hour No change after 1 hour. Patient offered pain medication again and declines. Patient offered Procardia to help with pain/contractions and declines. Patient does not qualify for BMZ at this time given cervical exam. Preterm labor precautions reviewed.   Assessment and Plan  A: SIUP at 524w2d Braxton hicks contractions  Migraine   P: Discharge home Preterm labor and pre-eclampsia precautions discussed Patient advised to follow-up with [facility] as planned on Monday Patient may return to MAU as needed or if her condition were to change or worsen  Vonzella NippleJulie Galena Logie, PA-C 07/28/2018, 2:20 PM

## 2018-07-28 NOTE — Discharge Instructions (Signed)
Braxton Hicks Contractions °Contractions of the uterus can occur throughout pregnancy, but they are not always a sign that you are in labor. You may have practice contractions called Braxton Hicks contractions. These false labor contractions are sometimes confused with true labor. °What are Braxton Hicks contractions? °Braxton Hicks contractions are tightening movements that occur in the muscles of the uterus before labor. Unlike true labor contractions, these contractions do not result in opening (dilation) and thinning of the cervix. Toward the end of pregnancy (32-34 weeks), Braxton Hicks contractions can happen more often and may become stronger. These contractions are sometimes difficult to tell apart from true labor because they can be very uncomfortable. You should not feel embarrassed if you go to the hospital with false labor. °Sometimes, the only way to tell if you are in true labor is for your health care provider to look for changes in the cervix. The health care provider will do a physical exam and may monitor your contractions. If you are not in true labor, the exam should show that your cervix is not dilating and your water has not broken. °If there are other health problems associated with your pregnancy, it is completely safe for you to be sent home with false labor. You may continue to have Braxton Hicks contractions until you go into true labor. °How to tell the difference between true labor and false labor °True labor °· Contractions last 30-70 seconds. °· Contractions become very regular. °· Discomfort is usually felt in the top of the uterus, and it spreads to the lower abdomen and low back. °· Contractions do not go away with walking. °· Contractions usually become more intense and increase in frequency. °· The cervix dilates and gets thinner. °False labor °· Contractions are usually shorter and not as strong as true labor contractions. °· Contractions are usually irregular. °· Contractions  are often felt in the front of the lower abdomen and in the groin. °· Contractions may go away when you walk around or change positions while lying down. °· Contractions get weaker and are shorter-lasting as time goes on. °· The cervix usually does not dilate or become thin. °Follow these instructions at home: °· Take over-the-counter and prescription medicines only as told by your health care provider. °· Keep up with your usual exercises and follow other instructions from your health care provider. °· Eat and drink lightly if you think you are going into labor. °· If Braxton Hicks contractions are making you uncomfortable: °? Change your position from lying down or resting to walking, or change from walking to resting. °? Sit and rest in a tub of warm water. °? Drink enough fluid to keep your urine pale yellow. Dehydration may cause these contractions. °? Do slow and deep breathing several times an hour. °· Keep all follow-up prenatal visits as told by your health care provider. This is important. °Contact a health care provider if: °· You have a fever. °· You have continuous pain in your abdomen. °Get help right away if: °· Your contractions become stronger, more regular, and closer together. °· You have fluid leaking or gushing from your vagina. °· You pass blood-tinged mucus (bloody show). °· You have bleeding from your vagina. °· You have low back pain that you never had before. °· You feel your baby’s head pushing down and causing pelvic pressure. °· Your baby is not moving inside you as much as it used to. °Summary °· Contractions that occur before labor are called Braxton   Hicks contractions, false labor, or practice contractions. °· Braxton Hicks contractions are usually shorter, weaker, farther apart, and less regular than true labor contractions. True labor contractions usually become progressively stronger and regular and they become more frequent. °· Manage discomfort from Braxton Hicks contractions by  changing position, resting in a warm bath, drinking plenty of water, or practicing deep breathing. °This information is not intended to replace advice given to you by your health care provider. Make sure you discuss any questions you have with your health care provider. °Document Released: 03/10/2017 Document Revised: 03/10/2017 Document Reviewed: 03/10/2017 °Elsevier Interactive Patient Education © 2018 [employer] ° °Fetal Movement Counts °Patient Name: ________________________________________________ Patient Due Date: ____________________ °What is a fetal movement count? °A fetal movement count is the number of times that you feel your baby move during a certain amount of time. This may also be called a fetal kick count. A fetal movement count is recommended for every pregnant woman. You may be asked to start counting fetal movements as early as week 28 of your pregnancy. °Pay attention to when your baby is most active. You may notice your baby's sleep and wake cycles. You may also notice things that make your baby move more. You should do a fetal movement count: °· When your baby is normally most active. °· At the same time each day. ° °A good time to count movements is while you are resting, after having something to eat and drink. °How do I count fetal movements? °1. Find a quiet, comfortable area. Sit, or lie down on your side. °2. Write down the date, the start time and stop time, and the number of movements that you felt between those two times. Take this information with you to your health care visits. °3. For 2 hours, count kicks, flutters, swishes, rolls, and jabs. You should feel at least 10 movements during 2 hours. °4. You may stop counting after you have felt 10 movements. °5. If you do not feel 10 movements in 2 hours, have something to eat and drink. Then, keep resting and counting for 1 hour. If you feel at least 4 movements during that hour, you may stop counting. °Contact a health care  provider if: °· You feel fewer than 4 movements in 2 hours. °· Your baby is not moving like he or she usually does. °Date: ____________ Start time: ____________ Stop time: ____________ Movements: ____________ °Date: ____________ Start time: ____________ Stop time: ____________ Movements: ____________ °Date: ____________ Start time: ____________ Stop time: ____________ Movements: ____________ °Date: ____________ Start time: ____________ Stop time: ____________ Movements: ____________ °Date: ____________ Start time: ____________ Stop time: ____________ Movements: ____________ °Date: ____________ Start time: ____________ Stop time: ____________ Movements: ____________ °Date: ____________ Start time: ____________ Stop time: ____________ Movements: ____________ °Date: ____________ Start time: ____________ Stop time: ____________ Movements: ____________ °Date: ____________ Start time: ____________ Stop time: ____________ Movements: ____________ °This information is not intended to replace advice given to you by your health care provider. Make sure you discuss any questions you have with your health care provider. °Document Released: 11/24/2006 Document Revised: 06/23/2016 Document Reviewed: 12/04/2015 °Elsevier Interactive Patient Education © 2018 [employer] ° °

## 2018-07-28 NOTE — MAU Note (Signed)
Called office this morning, because of the HA. Light and movement hurts.  If she moves to fast, she gets dizzy, vision is blurring. Denies epigastric pain  Reports increased swelling.  Is contracting- just irregular.

## 2018-07-28 NOTE — Telephone Encounter (Signed)
36 wks preg.  Contracting 7 to 9 minutes apart.  Pt complaining of headache that has lasted for days with no relief.  Pt says eyes water when she opens them and the light hurts them.  Discussed with Tish and [facility]  Pt voiced understanding with me.  07-28-18  AS

## 2018-07-30 ENCOUNTER — Inpatient Hospital Stay (HOSPITAL_COMMUNITY)
Admission: AD | Admit: 2018-07-30 | Discharge: 2018-07-30 | Disposition: A | Discharge status: Home / Self Care | Payer: PRIVATE HEALTH INSURANCE | Source: Ambulatory Visit | Attending: Obstetrics and Gynecology | Admitting: Obstetrics and Gynecology

## 2018-07-30 ENCOUNTER — Encounter (HOSPITAL_COMMUNITY): Payer: Self-pay | Admitting: *Deleted

## 2018-07-30 ENCOUNTER — Inpatient Hospital Stay (HOSPITAL_COMMUNITY)
Admission: AD | Admit: 2018-07-30 | Discharge: 2018-07-30 | Disposition: A | Discharge status: Home / Self Care | Payer: PRIVATE HEALTH INSURANCE | Source: Ambulatory Visit | Attending: Family Medicine | Admitting: Family Medicine

## 2018-07-30 DIAGNOSIS — O479 False labor, unspecified: Secondary | ICD-10-CM

## 2018-07-30 DIAGNOSIS — Z3A36 36 weeks gestation of pregnancy: Secondary | ICD-10-CM | POA: Diagnosis not present

## 2018-07-30 DIAGNOSIS — O26893 Other specified pregnancy related conditions, third trimester: Secondary | ICD-10-CM | POA: Diagnosis present

## 2018-07-30 DIAGNOSIS — Z3483 Encounter for supervision of other normal pregnancy, third trimester: Secondary | ICD-10-CM

## 2018-07-30 LAB — POCT FERN TEST: POCT FERN TEST: NEGATIVE

## 2018-07-30 LAB — AMNISURE RUPTURE OF MEMBRANE (ROM) NOT AT ARMC: AMNISURE: NEGATIVE

## 2018-07-30 NOTE — MAU Provider Note (Signed)
S: [name] is a 27 y.o. [id number] at 2730w4d  who presents to MAU today complaining of leaking of fluid since last night. She denies vaginal bleeding. She endorses contractions. She reports normal fetal movement.    O: BP 117/78 (BP Location: Right Arm)   Pulse 90   Temp 97.6 F (36.4 C) (Oral)   Resp 19   Wt 97.2 kg   LMP 11/16/2017   SpO2 100% Comment: ra  BMI 37.96 kg/m  GENERAL: Well-developed, well-nourished female in no acute distress.  HEAD: Normocephalic, atraumatic.  CHEST: Normal effort of breathing, regular heart rate ABDOMEN: Soft, nontender, gravid PELVIC: Perineum dry by RN. Fern Negative.   Cervical exam:  Dilation: 3 Effacement (%): 50 Cervical Position: Middle Station: -3 Presentation: Vertex Exam by:: Katherine ParodyA. Gagliardo, RN   Same re-check after 1 hour  Fetal Monitoring: Baseline: 125 Variability: moderate Accelerations: present Decelerations: absent Contractions: Irregular every 2-6 mins  Results for orders placed or performed during the hospital encounter of 07/30/18 (from the past 24 hour(s))  [facility]rom[facility]     Status: None   Collection Time: 07/30/18  9:56 AM  Result Value Ref Range   Amnisure ROM NEGATIVE   POCT fern test     Status: None   Collection Time: 07/30/18 10:03 AM  Result Value Ref Range   POCT Fern Test Negative = intact amniotic membranes      A: SIUP at 6630w4d  Membranes intact  P: No leakage of amniotic fluid Call Labor & delivery team for further instructions  Raelyn MoraRolitta Nimisha Rathel, CNM 07/30/2018, 11:08 AM

## 2018-07-30 NOTE — MAU Note (Signed)
Pt c/o u/'c's Q 2-4 min for the past couple hours . States her baby is moving and she denies any vag bleeding or leakning.

## 2018-07-30 NOTE — Discharge Instructions (Signed)
Braxton Hicks Contractions °Contractions of the uterus can occur throughout pregnancy, but they are not always a sign that you are in labor. You may have practice contractions called Braxton Hicks contractions. These false labor contractions are sometimes confused with true labor. °What are Braxton Hicks contractions? °Braxton Hicks contractions are tightening movements that occur in the muscles of the uterus before labor. Unlike true labor contractions, these contractions do not result in opening (dilation) and thinning of the cervix. Toward the end of pregnancy (32-34 weeks), Braxton Hicks contractions can happen more often and may become stronger. These contractions are sometimes difficult to tell apart from true labor because they can be very uncomfortable. You should not feel embarrassed if you go to the hospital with false labor. °Sometimes, the only way to tell if you are in true labor is for your health care provider to look for changes in the cervix. The health care provider will do a physical exam and may monitor your contractions. If you are not in true labor, the exam should show that your cervix is not dilating and your water has not broken. °If there are other health problems associated with your pregnancy, it is completely safe for you to be sent home with false labor. You may continue to have Braxton Hicks contractions until you go into true labor. °How to tell the difference between true labor and false labor °True labor °· Contractions last 30-70 seconds. °· Contractions become very regular. °· Discomfort is usually felt in the top of the uterus, and it spreads to the lower abdomen and low back. °· Contractions do not go away with walking. °· Contractions usually become more intense and increase in frequency. °· The cervix dilates and gets thinner. °False labor °· Contractions are usually shorter and not as strong as true labor contractions. °· Contractions are usually irregular. °· Contractions  are often felt in the front of the lower abdomen and in the groin. °· Contractions may go away when you walk around or change positions while lying down. °· Contractions get weaker and are shorter-lasting as time goes on. °· The cervix usually does not dilate or become thin. °Follow these instructions at home: °· Take over-the-counter and prescription medicines only as told by your health care provider. °· Keep up with your usual exercises and follow other instructions from your health care provider. °· Eat and drink lightly if you think you are going into labor. °· If Braxton Hicks contractions are making you uncomfortable: °? Change your position from lying down or resting to walking, or change from walking to resting. °? Sit and rest in a tub of warm water. °? Drink enough fluid to keep your urine pale yellow. Dehydration may cause these contractions. °? Do slow and deep breathing several times an hour. °· Keep all follow-up prenatal visits as told by your health care provider. This is important. °Contact a health care provider if: °· You have a fever. °· You have continuous pain in your abdomen. °Get help right away if: °· Your contractions become stronger, more regular, and closer together. °· You have fluid leaking or gushing from your vagina. °· You pass blood-tinged mucus (bloody show). °· You have bleeding from your vagina. °· You have low back pain that you never had before. °· You feel your baby’s head pushing down and causing pelvic pressure. °· Your baby is not moving inside you as much as it used to. °Summary °· Contractions that occur before labor are called Braxton   Hicks contractions, false labor, or practice contractions. °· Braxton Hicks contractions are usually shorter, weaker, farther apart, and less regular than true labor contractions. True labor contractions usually become progressively stronger and regular and they become more frequent. °· Manage discomfort from Braxton Hicks contractions by  changing position, resting in a warm bath, drinking plenty of water, or practicing deep breathing. °This information is not intended to replace advice given to you by your health care provider. Make sure you discuss any questions you have with your health care provider. °Document Released: 03/10/2017 Document Revised: 03/10/2017 Document Reviewed: 03/10/2017 °Elsevier Interactive Patient Education © 2018 [employer] Inc. ° °

## 2018-07-30 NOTE — Discharge Instructions (Signed)
Braxton Hicks Contractions °Contractions of the uterus can occur throughout pregnancy, but they are not always a sign that you are in labor. You may have practice contractions called Braxton Hicks contractions. These false labor contractions are sometimes confused with true labor. °What are Braxton Hicks contractions? °Braxton Hicks contractions are tightening movements that occur in the muscles of the uterus before labor. Unlike true labor contractions, these contractions do not result in opening (dilation) and thinning of the cervix. Toward the end of pregnancy (32-34 weeks), Braxton Hicks contractions can happen more often and may become stronger. These contractions are sometimes difficult to tell apart from true labor because they can be very uncomfortable. You should not feel embarrassed if you go to the hospital with false labor. °Sometimes, the only way to tell if you are in true labor is for your health care provider to look for changes in the cervix. The health care provider will do a physical exam and may monitor your contractions. If you are not in true labor, the exam should show that your cervix is not dilating and your water has not broken. °If there are other health problems associated with your pregnancy, it is completely safe for you to be sent home with false labor. You may continue to have Braxton Hicks contractions until you go into true labor. °How to tell the difference between true labor and false labor °True labor °· Contractions last 30-70 seconds. °· Contractions become very regular. °· Discomfort is usually felt in the top of the uterus, and it spreads to the lower abdomen and low back. °· Contractions do not go away with walking. °· Contractions usually become more intense and increase in frequency. °· The cervix dilates and gets thinner. °False labor °· Contractions are usually shorter and not as strong as true labor contractions. °· Contractions are usually irregular. °· Contractions  are often felt in the front of the lower abdomen and in the groin. °· Contractions may go away when you walk around or change positions while lying down. °· Contractions get weaker and are shorter-lasting as time goes on. °· The cervix usually does not dilate or become thin. °Follow these instructions at home: °· Take over-the-counter and prescription medicines only as told by your health care provider. °· Keep up with your usual exercises and follow other instructions from your health care provider. °· Eat and drink lightly if you think you are going into labor. °· If Braxton Hicks contractions are making you uncomfortable: °? Change your position from lying down or resting to walking, or change from walking to resting. °? Sit and rest in a tub of warm water. °? Drink enough fluid to keep your urine pale yellow. Dehydration may cause these contractions. °? Do slow and deep breathing several times an hour. °· Keep all follow-up prenatal visits as told by your health care provider. This is important. °Contact a health care provider if: °· You have a fever. °· You have continuous pain in your abdomen. °Get help right away if: °· Your contractions become stronger, more regular, and closer together. °· You have fluid leaking or gushing from your vagina. °· You pass blood-tinged mucus (bloody show). °· You have bleeding from your vagina. °· You have low back pain that you never had before. °· You feel your baby’s head pushing down and causing pelvic pressure. °· Your baby is not moving inside you as much as it used to. °Summary °· Contractions that occur before labor are called Braxton   Hicks contractions, false labor, or practice contractions. °· Braxton Hicks contractions are usually shorter, weaker, farther apart, and less regular than true labor contractions. True labor contractions usually become progressively stronger and regular and they become more frequent. °· Manage discomfort from Braxton Hicks contractions by  changing position, resting in a warm bath, drinking plenty of water, or practicing deep breathing. °This information is not intended to replace advice given to you by your health care provider. Make sure you discuss any questions you have with your health care provider. °Document Released: 03/10/2017 Document Revised: 03/10/2017 Document Reviewed: 03/10/2017 °Elsevier Interactive Patient Education © 2018 [employer] ° °Fetal Movement Counts °Patient Name: ________________________________________________ Patient Due Date: ____________________ °What is a fetal movement count? °A fetal movement count is the number of times that you feel your baby move during a certain amount of time. This may also be called a fetal kick count. A fetal movement count is recommended for every pregnant woman. You may be asked to start counting fetal movements as early as week 28 of your pregnancy. °Pay attention to when your baby is most active. You may notice your baby's sleep and wake cycles. You may also notice things that make your baby move more. You should do a fetal movement count: °· When your baby is normally most active. °· At the same time each day. ° °A good time to count movements is while you are resting, after having something to eat and drink. °How do I count fetal movements? °1. Find a quiet, comfortable area. Sit, or lie down on your side. °2. Write down the date, the start time and stop time, and the number of movements that you felt between those two times. Take this information with you to your health care visits. °3. For 2 hours, count kicks, flutters, swishes, rolls, and jabs. You should feel at least 10 movements during 2 hours. °4. You may stop counting after you have felt 10 movements. °5. If you do not feel 10 movements in 2 hours, have something to eat and drink. Then, keep resting and counting for 1 hour. If you feel at least 4 movements during that hour, you may stop counting. °Contact a health care  provider if: °· You feel fewer than 4 movements in 2 hours. °· Your baby is not moving like he or she usually does. °Date: ____________ Start time: ____________ Stop time: ____________ Movements: ____________ °Date: ____________ Start time: ____________ Stop time: ____________ Movements: ____________ °Date: ____________ Start time: ____________ Stop time: ____________ Movements: ____________ °Date: ____________ Start time: ____________ Stop time: ____________ Movements: ____________ °Date: ____________ Start time: ____________ Stop time: ____________ Movements: ____________ °Date: ____________ Start time: ____________ Stop time: ____________ Movements: ____________ °Date: ____________ Start time: ____________ Stop time: ____________ Movements: ____________ °Date: ____________ Start time: ____________ Stop time: ____________ Movements: ____________ °Date: ____________ Start time: ____________ Stop time: ____________ Movements: ____________ °This information is not intended to replace advice given to you by your health care provider. Make sure you discuss any questions you have with your health care provider. °Document Released: 11/24/2006 Document Revised: 06/23/2016 Document Reviewed: 12/04/2015 °Elsevier Interactive Patient Education © 2018 [employer] ° °

## 2018-07-30 NOTE — MAU Note (Signed)
Katherine Villegas is a 27 y.o. at 2759w4d here in MAU reporting: LOF clear Onset of complaint: last night at 10pm. Felt a pop then a gush of fluid which has continued. Pain score: 6/10 with contractions. +lower abdominal and back pain Vitals:   07/30/18 0938 07/30/18 1345  BP: 117/78 121/60  Pulse: 90 (!) 102  Resp: 19   Temp: 97.6 F (36.4 C)   SpO2: 100%      Lab orders placed from triage: mau labor triage standing orders

## 2018-07-30 NOTE — MAU Note (Signed)
I have communicated with Philipp DeputyKim Shaw CNM and reviewed vital signs:  Vitals:   07/30/18 2102 07/30/18 2310  BP: 106/72 114/77  Pulse: 100 96  Resp: 17 16  Temp: 98 F (36.7 C)     Vaginal exam:  Dilation: 3 Effacement (%): 70 Cervical Position: Anterior Station: -2 Presentation: Vertex Exam by:: K. WeissRN,   Also reviewed contraction pattern and that non-stress test is reactive.  It has been documented that patient is contracting every 2-5 minutes with no cervical change over 1 1/2  hours not indicating active labor.  Patient denies any other complaints.  Based on this report provider has given order for discharge.  A discharge order and diagnosis entered by a provider.   Labor discharge instructions reviewed with patient.

## 2018-07-30 NOTE — Progress Notes (Signed)
I have communicated with Zerita Boersarlene Lawson, CNM and reviewed vital signs:  Vitals:   07/30/18 0938 07/30/18 1345  BP: 117/78 121/60  Pulse: 90 (!) 102  Resp: 19   Temp: 97.6 F (36.4 C)   SpO2: 100%     Vaginal exam:  Dilation: 3.5 Effacement (%): 70 Cervical Position: Posterior Station: -2 Presentation: Vertex Exam by:: A. Aydrian Halpin, RN,   Also reviewed contraction pattern and that non-stress test is reactive.  It has been documented that patient is contracting every 2-5 minutes with minimal cervical change over 2.5 hours not indicating active labor.  Patient denies any other complaints.  Based on this report provider has given order for discharge.  A discharge order and diagnosis entered by a provider.   Labor discharge instructions reviewed with patient.

## 2018-07-30 NOTE — Progress Notes (Signed)
Notified Raelyn Moraolitta Dawson, CNM of cervical exam 3/50/-3 will recheck in an hour.

## 2018-07-31 ENCOUNTER — Ambulatory Visit (INDEPENDENT_AMBULATORY_CARE_PROVIDER_SITE_OTHER): Payer: PRIVATE HEALTH INSURANCE | Admitting: Women's Health

## 2018-07-31 ENCOUNTER — Encounter: Payer: Self-pay | Admitting: Women's Health

## 2018-07-31 VITALS — BP 113/78 | HR 106 | Wt 213.0 lb

## 2018-07-31 DIAGNOSIS — Z3A36 36 weeks gestation of pregnancy: Secondary | ICD-10-CM | POA: Diagnosis not present

## 2018-07-31 DIAGNOSIS — Z3483 Encounter for supervision of other normal pregnancy, third trimester: Secondary | ICD-10-CM

## 2018-07-31 DIAGNOSIS — O479 False labor, unspecified: Secondary | ICD-10-CM

## 2018-07-31 DIAGNOSIS — Z1389 Encounter for screening for other disorder: Secondary | ICD-10-CM

## 2018-07-31 DIAGNOSIS — Z331 Pregnant state, incidental: Secondary | ICD-10-CM

## 2018-07-31 DIAGNOSIS — O4700 False labor before 37 completed weeks of gestation, unspecified trimester: Secondary | ICD-10-CM

## 2018-07-31 LAB — POCT URINALYSIS DIPSTICK OB
Glucose, UA: NEGATIVE
Ketones, UA: NEGATIVE
LEUKOCYTES UA: NEGATIVE
Nitrite, UA: NEGATIVE

## 2018-07-31 LAB — OB RESULTS CONSOLE GBS: GBS: NEGATIVE

## 2018-07-31 NOTE — Patient Instructions (Signed)
[name], I greatly value your feedback.  If you receive a survey following your visit with us today, we appreciate you taking the time to fill it out.  Thanks, Joellyn HaffKim Ac Colan, CNM, WHNP-BC   Call the office [phone]) or go to [facility] if:  You begin to have strong, frequent contractions  Your water breaks.  Sometimes it is a big gush of fluid, sometimes it is just a trickle that keeps getting your panties wet or running down your legs  You have vaginal bleeding.  It is normal to have a small amount of spotting if your cervix was checked.   You don't feel your baby moving like normal.  If you don't, get you something to eat and drink and lay down and focus on feeling your baby move.  You should feel at least 10 movements in 2 hours.  If you don't, you should call the office or go to [facility].     Preterm Labor and Birth Information The normal length of a pregnancy is 39-41 weeks. Preterm labor is when labor starts before 37 completed weeks of pregnancy. What are the risk factors for preterm labor? Preterm labor is more likely to occur in women who:  Have certain infections during pregnancy such as a bladder infection, sexually transmitted infection, or infection inside the uterus (chorioamnionitis).  Have a shorter-than-normal cervix.  Have gone into preterm labor before.  Have had surgery on their cervix.  Are younger than age 27 or older than age 27.  Are African American.  Are pregnant with twins or multiple babies (multiple gestation).  Take street drugs or smoke while pregnant.  Do not gain enough weight while pregnant.  Became pregnant shortly after having been pregnant.  What are the symptoms of preterm labor? Symptoms of preterm labor include:  Cramps similar to those that can happen during a menstrual period. The cramps may happen with diarrhea.  Pain in the abdomen or lower back.  Regular uterine contractions that may feel like tightening of  the abdomen.  A feeling of increased pressure in the pelvis.  Increased watery or bloody mucus discharge from the vagina.  Water breaking (ruptured amniotic sac).  Why is it important to recognize signs of preterm labor? It is important to recognize signs of preterm labor because babies who are born prematurely may not be fully developed. This can put them at an increased risk for:  Long-term (chronic) heart and lung problems.  Difficulty immediately after birth with regulating body systems, including blood sugar, body temperature, heart rate, and breathing rate.  Bleeding in the brain.  Cerebral palsy.  Learning difficulties.  Death.  These risks are highest for babies who are born before 34 weeks of pregnancy. How is preterm labor treated? Treatment depends on the length of your pregnancy, your condition, and the health of your baby. It may involve:  Having a stitch (suture) placed in your cervix to prevent your cervix from opening too early (cerclage).  Taking or being given medicines, such as: ? Hormone medicines. These may be given early in pregnancy to help support the pregnancy. ? Medicine to stop contractions. ? Medicines to help mature the baby's lungs. These may be prescribed if the risk of delivery is high. ? Medicines to prevent your baby from developing cerebral palsy.  If the labor happens before 34 weeks of pregnancy, you may need to stay in the hospital. What should I do if I think I am in preterm labor? If you  think that you are going into preterm labor, call your health care provider right away. How can I prevent preterm labor in future pregnancies? To increase your chance of having a full-term pregnancy:  Do not use any tobacco products, such as cigarettes, chewing tobacco, and e-cigarettes. If you need help quitting, ask your health care provider.  Do not use street drugs or medicines that have not been prescribed to you during your pregnancy.  Talk  with your health care provider before taking any herbal supplements, even if you have been taking them regularly.  Make sure you gain a healthy amount of weight during your pregnancy.  Watch for infection. If you think that you might have an infection, get it checked right away.  Make sure to tell your health care provider if you have gone into preterm labor before.  This information is not intended to replace advice given to you by your health care provider. Make sure you discuss any questions you have with your health care provider. Document Released: 01/15/2004 Document Revised: 04/06/2016 Document Reviewed: 03/17/2016 Elsevier Interactive Patient Education  2018 [employer].

## 2018-07-31 NOTE — Progress Notes (Signed)
LOW-RISK PREGNANCY VISIT Patient name: Katherine Villegas MRN [id number]  Date of birth: [birth year] Chief Complaint:   Routine Prenatal Visit (GBS; GC/CHL; went to [facility] yesterday with contractions; lost mucus plug )  History of Present Illness:   Katherine Villegas is a 27 y.o. [id number] female at [redacted]w[redacted]d with an Estimated Date of Delivery: 08/23/18 being seen today for ongoing management of a low-risk pregnancy.  Today she reports regular uc's q 2-53mins, went to [facility] yesterday am w/ r/o rom, amnisure neg. Went back last night for r/o labor, states she was 3/80 and was d/c'd. UCs continued through night, worse this am. Good fm, no lof, some mucous d/c w/ brownish blood w/ it. Contractions: Regular. Vag. Bleeding: None.  Movement: Present. denies leaking of fluid. Review of Systems:   Pertinent items are noted in HPI Denies abnormal vaginal discharge w/ itching/odor/irritation, headaches, visual changes, shortness of breath, chest pain, abdominal pain, severe nausea/vomiting, or problems with urination or bowel movements unless otherwise stated above. Pertinent History Reviewed:  Reviewed past medical,surgical, social, obstetrical and family history.  Reviewed problem list, medications and allergies. Physical Assessment:   Vitals:   07/31/18 1204  BP: 113/78  Pulse: (!) 106  Weight: 213 lb (96.6 kg)  Body mass index is 38.96 kg/m.        Physical Examination:   General appearance: Well appearing, and in no distress  Mental status: Alert, oriented to person, place, and time  Skin: Warm & dry  Cardiovascular: Normal heart rate noted  Respiratory: Normal respiratory effort, no distress  Abdomen: Soft, gravid, nontender  Pelvic: Cervical exam performed  Dilation: 3 Effacement (%): 50 Station: -2, declined 2nd exam  Extremities: Edema: Trace  Fetal Status: Fetal Heart Rate (bpm): 120 Fundal Height: 36 cm Movement: Present Presentation: Vertex  NST: FHR baseline 120 bpm, Variability: moderate,  Accelerations:present, Decelerations:  Small early/variable at beginning of strip, none after turning to Lt lateral = Cat 1/Reactive Toco: q 1-67mins    Results for orders placed or performed in visit on 07/31/18 (from the past 24 hour(s))  POC Urinalysis Dipstick OB   Collection Time: 07/31/18 12:04 PM  Result Value Ref Range   Color, UA     Clarity, UA     Glucose, UA Negative Negative   Bilirubin, UA     Ketones, UA neg    Spec Grav, UA     Blood, UA trace    pH, UA     POC Protein UA Trace Negative, Trace   Urobilinogen, UA     Nitrite, UA neg    Leukocytes, UA Negative Negative   Appearance     Odor      Assessment & Plan:  1) Low-risk pregnancy [id number] at [redacted]w[redacted]d with an Estimated Date of Delivery: 08/23/18   2) Preterm uc's, no cervical change from last night in MAU, to go to [facility] if worsens/rom/decreased fm, etc   Meds: No orders of the defined types were placed in this encounter.  Labs/procedures today: nst, sve, gbs, gc/ct  Plan:  Continue routine obstetrical care   Reviewed: Preterm labor symptoms and general obstetric precautions including but not limited to vaginal bleeding, contractions, leaking of fluid and fetal movement were reviewed in detail with the patient.  All questions were answered  Follow-up: Return in about 1 week (around 08/07/2018) for LROB.  Orders Placed This Encounter  Procedures  . GC/Chlamydia Probe Amp  . Strep Gp B NAA+Rflx  . POC Urinalysis Dipstick OB   Cala Bradford  Mady GemmaR Irish Piech CNM, WHNP-BC 07/31/2018 1:40 PM

## 2018-08-02 LAB — GC/CHLAMYDIA PROBE AMP
CHLAMYDIA, DNA PROBE: NEGATIVE
Neisseria gonorrhoeae by PCR: NEGATIVE

## 2018-08-02 LAB — STREP GP B NAA+RFLX: Strep Gp B NAA+Rflx: NEGATIVE

## 2018-08-05 ENCOUNTER — Inpatient Hospital Stay (HOSPITAL_COMMUNITY): Payer: PRIVATE HEALTH INSURANCE | Admitting: Anesthesiology

## 2018-08-05 ENCOUNTER — Encounter (HOSPITAL_COMMUNITY): Payer: Self-pay

## 2018-08-05 ENCOUNTER — Inpatient Hospital Stay (HOSPITAL_COMMUNITY)
Admission: AD | Admit: 2018-08-05 | Discharge: 2018-08-06 | DRG: 798 | Disposition: A | Discharge status: Home / Self Care | Payer: PRIVATE HEALTH INSURANCE | Attending: Family Medicine | Admitting: Family Medicine

## 2018-08-05 DIAGNOSIS — O34219 Maternal care for unspecified type scar from previous cesarean delivery: Secondary | ICD-10-CM | POA: Diagnosis present

## 2018-08-05 DIAGNOSIS — Z3A37 37 weeks gestation of pregnancy: Secondary | ICD-10-CM | POA: Diagnosis not present

## 2018-08-05 DIAGNOSIS — F17210 Nicotine dependence, cigarettes, uncomplicated: Secondary | ICD-10-CM | POA: Diagnosis present

## 2018-08-05 DIAGNOSIS — O99334 Smoking (tobacco) complicating childbirth: Secondary | ICD-10-CM | POA: Diagnosis present

## 2018-08-05 DIAGNOSIS — O4290 Premature rupture of membranes, unspecified as to length of time between rupture and onset of labor, unspecified weeks of gestation: Secondary | ICD-10-CM | POA: Diagnosis present

## 2018-08-05 DIAGNOSIS — Z880 Allergy status to penicillin: Secondary | ICD-10-CM | POA: Diagnosis not present

## 2018-08-05 DIAGNOSIS — Z302 Encounter for sterilization: Secondary | ICD-10-CM

## 2018-08-05 DIAGNOSIS — Z91040 Latex allergy status: Secondary | ICD-10-CM | POA: Diagnosis not present

## 2018-08-05 DIAGNOSIS — Z3483 Encounter for supervision of other normal pregnancy, third trimester: Secondary | ICD-10-CM

## 2018-08-05 DIAGNOSIS — O4292 Full-term premature rupture of membranes, unspecified as to length of time between rupture and onset of labor: Principal | ICD-10-CM | POA: Diagnosis present

## 2018-08-05 LAB — CBC
HEMATOCRIT: 36.7 % (ref 36.0–46.0)
HEMOGLOBIN: 12.2 g/dL (ref 12.0–15.0)
MCH: 29.5 pg (ref 26.0–34.0)
MCHC: 33.2 g/dL (ref 30.0–36.0)
MCV: 88.6 fL (ref 78.0–100.0)
Platelets: 163 10*3/uL (ref 150–400)
RBC: 4.14 MIL/uL (ref 3.87–5.11)
RDW: 14.9 % (ref 11.5–15.5)
WBC: 12.2 10*3/uL — ABNORMAL HIGH (ref 4.0–10.5)

## 2018-08-05 LAB — TYPE AND SCREEN
ABO/RH(D): O POS
ANTIBODY SCREEN: NEGATIVE

## 2018-08-05 LAB — POCT FERN TEST: POCT Fern Test: POSITIVE

## 2018-08-05 MED ORDER — OXYTOCIN BOLUS FROM INFUSION
500.0000 mL | Freq: Once | INTRAVENOUS | Status: AC
  Administered 2018-08-05: 500 mL via INTRAVENOUS

## 2018-08-05 MED ORDER — OXYCODONE-ACETAMINOPHEN 5-325 MG PO TABS: 1.0000 | ORAL_TABLET | ORAL | Status: DC | PRN

## 2018-08-05 MED ORDER — LIDOCAINE HCL (PF) 1 % IJ SOLN
30.0000 mL | INTRAMUSCULAR | Status: DC | PRN
  Administered 2018-08-05: 30 mL via SUBCUTANEOUS
  Filled 2018-08-05: qty 30

## 2018-08-05 MED ORDER — FLEET ENEMA 7-19 GM/118ML RE ENEM: 1.0000 | ENEMA | RECTAL | Status: DC | PRN

## 2018-08-05 MED ORDER — ONDANSETRON HCL 4 MG PO TABS: 4.0000 mg | ORAL_TABLET | ORAL | Status: DC | PRN

## 2018-08-05 MED ORDER — DIPHENHYDRAMINE HCL 50 MG/ML IJ SOLN: 12.5000 mg | INTRAMUSCULAR | Status: DC | PRN

## 2018-08-05 MED ORDER — WITCH HAZEL-GLYCERIN EX PADS: 1.0000 "application " | MEDICATED_PAD | CUTANEOUS | Status: DC | PRN

## 2018-08-05 MED ORDER — BENZOCAINE-MENTHOL 20-0.5 % EX AERO
1.0000 "application " | INHALATION_SPRAY | CUTANEOUS | Status: DC | PRN
  Filled 2018-08-05: qty 56

## 2018-08-05 MED ORDER — LIDOCAINE HCL (PF) 1 % IJ SOLN
INTRAMUSCULAR | Status: DC | PRN
  Administered 2018-08-05: 10 mL via EPIDURAL

## 2018-08-05 MED ORDER — TERBUTALINE SULFATE 1 MG/ML IJ SOLN
0.2500 mg | Freq: Once | INTRAMUSCULAR | Status: DC | PRN
  Filled 2018-08-05: qty 1

## 2018-08-05 MED ORDER — ACETAMINOPHEN 325 MG PO TABS: 650.0000 mg | ORAL_TABLET | ORAL | Status: DC | PRN

## 2018-08-05 MED ORDER — SIMETHICONE 80 MG PO CHEW: 80.0000 mg | CHEWABLE_TABLET | ORAL | Status: DC | PRN

## 2018-08-05 MED ORDER — OXYCODONE HCL 5 MG PO TABS
10.0000 mg | ORAL_TABLET | ORAL | Status: DC | PRN
  Administered 2018-08-06: 10 mg via ORAL
  Filled 2018-08-05: qty 2

## 2018-08-05 MED ORDER — OXYTOCIN 40 UNITS IN LACTATED RINGERS INFUSION - SIMPLE MED: 2.5000 [IU]/h | INTRAVENOUS | Status: DC

## 2018-08-05 MED ORDER — ONDANSETRON HCL 4 MG/2ML IJ SOLN: 4.0000 mg | Freq: Four times a day (QID) | INTRAMUSCULAR | Status: DC | PRN

## 2018-08-05 MED ORDER — LACTATED RINGERS IV SOLN
500.0000 mL | INTRAVENOUS | Status: DC | PRN
  Administered 2018-08-05: 500 mL via INTRAVENOUS

## 2018-08-05 MED ORDER — PRENATAL MULTIVITAMIN CH: 1.0000 | ORAL_TABLET | Freq: Every day | ORAL | Status: DC

## 2018-08-05 MED ORDER — PHENYLEPHRINE 40 MCG/ML (10ML) SYRINGE FOR IV PUSH (FOR BLOOD PRESSURE SUPPORT)
80.0000 ug | PREFILLED_SYRINGE | INTRAVENOUS | Status: DC | PRN
  Filled 2018-08-05: qty 5
  Filled 2018-08-05: qty 10

## 2018-08-05 MED ORDER — DIPHENHYDRAMINE HCL 25 MG PO CAPS: 25.0000 mg | ORAL_CAPSULE | Freq: Four times a day (QID) | ORAL | Status: DC | PRN

## 2018-08-05 MED ORDER — SOD CITRATE-CITRIC ACID 500-334 MG/5ML PO SOLN: 30.0000 mL | ORAL | Status: DC | PRN

## 2018-08-05 MED ORDER — EPHEDRINE 5 MG/ML INJ
10.0000 mg | INTRAVENOUS | Status: DC | PRN
  Filled 2018-08-05: qty 2

## 2018-08-05 MED ORDER — TETANUS-DIPHTH-ACELL PERTUSSIS 5-2.5-18.5 LF-MCG/0.5 IM SUSP: 0.5000 mL | Freq: Once | INTRAMUSCULAR | Status: DC

## 2018-08-05 MED ORDER — LACTATED RINGERS IV SOLN
INTRAVENOUS | Status: DC
  Administered 2018-08-05 (×2): via INTRAVENOUS

## 2018-08-05 MED ORDER — DIBUCAINE 1 % RE OINT: 1.0000 "application " | TOPICAL_OINTMENT | RECTAL | Status: DC | PRN

## 2018-08-05 MED ORDER — OXYCODONE HCL 5 MG PO TABS
5.0000 mg | ORAL_TABLET | ORAL | Status: DC | PRN
  Administered 2018-08-05 – 2018-08-06 (×2): 5 mg via ORAL
  Filled 2018-08-05 (×2): qty 1

## 2018-08-05 MED ORDER — OXYCODONE-ACETAMINOPHEN 5-325 MG PO TABS: 2.0000 | ORAL_TABLET | ORAL | Status: DC | PRN

## 2018-08-05 MED ORDER — ONDANSETRON HCL 4 MG/2ML IJ SOLN: 4.0000 mg | INTRAMUSCULAR | Status: DC | PRN

## 2018-08-05 MED ORDER — IBUPROFEN 600 MG PO TABS
600.0000 mg | ORAL_TABLET | Freq: Four times a day (QID) | ORAL | Status: DC
  Administered 2018-08-05 – 2018-08-06 (×4): 600 mg via ORAL
  Filled 2018-08-05 (×4): qty 1

## 2018-08-05 MED ORDER — FENTANYL 2.5 MCG/ML BUPIVACAINE 1/10 % EPIDURAL INFUSION (WH - ANES)
14.0000 mL/h | INTRAMUSCULAR | Status: DC | PRN
  Administered 2018-08-05: 14 mL/h via EPIDURAL
  Filled 2018-08-05: qty 100

## 2018-08-05 MED ORDER — COCONUT OIL OIL: 1.0000 "application " | TOPICAL_OIL | Status: DC | PRN

## 2018-08-05 MED ORDER — SENNOSIDES-DOCUSATE SODIUM 8.6-50 MG PO TABS
2.0000 | ORAL_TABLET | ORAL | Status: DC
  Administered 2018-08-06: 2 via ORAL
  Filled 2018-08-05: qty 2

## 2018-08-05 MED ORDER — OXYTOCIN 40 UNITS IN LACTATED RINGERS INFUSION - SIMPLE MED
1.0000 m[IU]/min | INTRAVENOUS | Status: DC
  Administered 2018-08-05: 2 m[IU]/min via INTRAVENOUS
  Filled 2018-08-05: qty 1000

## 2018-08-05 MED ORDER — LACTATED RINGERS IV SOLN
500.0000 mL | Freq: Once | INTRAVENOUS | Status: AC
  Administered 2018-08-05: 500 mL via INTRAVENOUS

## 2018-08-05 MED ORDER — PHENYLEPHRINE 40 MCG/ML (10ML) SYRINGE FOR IV PUSH (FOR BLOOD PRESSURE SUPPORT)
80.0000 ug | PREFILLED_SYRINGE | INTRAVENOUS | Status: DC | PRN
  Filled 2018-08-05: qty 5

## 2018-08-05 MED ORDER — ZOLPIDEM TARTRATE 5 MG PO TABS: 5.0000 mg | ORAL_TABLET | Freq: Every evening | ORAL | Status: DC | PRN

## 2018-08-05 NOTE — Lactation Note (Addendum)
This note was copied from a baby's chart. Lactation Consultation Note  Patient Name: Katherine Villegas Date: 08/05/2018  5 hour old infant g5p3 vag delivery of baby Katherine Seltzer at 37 weeks and 3 days, early term infant.  Blood glucose 64.  Checked due to jitteriness.  Mom with poor lactation history.  Mom reports first baby was in NICU and she pumped for 2 weeks.  Mom reports that she did not like having to get up at night to pump.  Mom reports quit pumping for NICU baby before infant came home from the hospital. Mom reports with second baby she tried to breastfeed in hospital and then when she got home.  Mom reports that she never breastfed well.  Then when they went to her first doctors appt they said she needed formula.  Mom reports she quit bf and started formula feeding at that time.  Mom reports that infant had a lip tie.  Mom reports that when she took her otherchild to dentist that she got dentist to look in the baby's mouth to see why she wouldn't breastfeed and he told her infant had a lip tie.  Mom concerned that this infant has a lip tie.  Mom reports it feels tight where her upper lip is where she breastfeeds.  Discussed giving infant a chance to breastfeed. Encouraged mom to get a deep latch wide open  Mouth with her and try to manually flange her upper lip out if able. Urged mom to hand express and rub EMM on nipples and let air dry.  Reviewed the breastfeeding part of Understanding mother and baby Care.  praised moms breastfeeding.  Gave handouts on Breastfeeding Consultation Services at [facility] and Breastfeeding resources list and told mom abot Breastfeeding support group. Mom reports she has medicaid and [employer] and has a DEBP for home use  Infant only 5 hours old at this time.    Maternal Data    Feeding Feeding Type: Breast Fed Length of feed: 5 min  LATCH Score                   Interventions    Lactation Tools Discussed/Used     Consult  Status      Paul Torpey Michaelle Copas 08/05/2018, 6:04 PM

## 2018-08-05 NOTE — Progress Notes (Signed)
Katherine Villegas is a 27 y.o. [id number] at [redacted]w[redacted]d  admitted for PROM  Subjective:  No complaints. Comfortable with epidural at this time.   Objective: BP 103/65   Pulse 76   Temp 97.9 F (36.6 C) (Oral)   Resp 14   Ht 5\' 2"  (1.575 m)   Wt 98.9 kg   LMP 11/16/2017   SpO2 97%   BMI 39.87 kg/m  No intake/output data recorded. No intake/output data recorded.  FHT:  FHR: 125 bpm, variability: moderate,  accelerations:  Present,  decelerations:  Absent UC:   regular, every 2-3 minutes SVE:   Dilation: 4 Effacement (%): 80 Station: -2, -1 Exam by:: Arne Cleveland, RN  Labs: Lab Results  Component Value Date   WBC 12.2 (H) 08/05/2018   HGB 12.2 08/05/2018   HCT 36.7 08/05/2018   MCV 88.6 08/05/2018   PLT 163 08/05/2018    Assessment / Plan: Induction of labor due to PROM,  progressing well on pitocin  Labor: Progressing normally Preeclampsia:  NA Fetal Wellbeing:  Category I Pain Control:  Epidural I/D:  n/a Anticipated MOD:  NSVD  Thressa Sheller 08/05/2018, 10:38 AM

## 2018-08-05 NOTE — MAU Note (Signed)
Pt states SROM around 2300. Some mild cntrx. Denies bleeding. +FM. Is planning repeat VBAC;

## 2018-08-05 NOTE — Anesthesia Preprocedure Evaluation (Signed)
Anesthesia Evaluation  Patient identified by MRN, date of birth, ID band Patient awake    Reviewed: Allergy & Precautions, H&P , NPO status , Patient's Chart, lab work & pertinent test results  History of Anesthesia Complications Negative for: history of anesthetic complications  Airway Mallampati: II  TM Distance: >3 FB Neck ROM: full    Dental no notable dental hx.    Pulmonary neg pulmonary ROS, Current Smoker,    Pulmonary exam normal        Cardiovascular negative cardio ROS Normal cardiovascular exam Rhythm:regular Rate:Normal     Neuro/Psych negative neurological ROS  negative psych ROS   GI/Hepatic negative GI ROS, Neg liver ROS,   Endo/Other  negative endocrine ROS  Renal/GU negative Renal ROS  negative genitourinary   Musculoskeletal   Abdominal   Peds  Hematology negative hematology ROS (+)   Anesthesia Other Findings   Reproductive/Obstetrics (+) Pregnancy                             Anesthesia Physical Anesthesia Plan  ASA: II  Anesthesia Plan: Epidural   Post-op Pain Management:    Induction:   PONV Risk Score and Plan:   Airway Management Planned:   Additional Equipment:   Intra-op Plan:   Post-operative Plan:   Informed Consent: I have reviewed the patients History and Physical, chart, labs and discussed the procedure including the risks, benefits and alternatives for the proposed anesthesia with the patient or authorized representative who has indicated his/her understanding and acceptance.       Plan Discussed with:   Anesthesia Plan Comments:         Anesthesia Quick Evaluation  

## 2018-08-05 NOTE — Progress Notes (Signed)
OB/GYN Faculty Practice: Labor Progress Note  Subjective: Strip note. Notified by RN around 0530, resolved with repositioning.   Objective: BP 113/82   Pulse 73   Temp (!) 97.5 F (36.4 C) (Oral)   Resp 18   Ht 5\' 2"  (1.575 m)   Wt 98.9 kg   LMP 11/16/2017   SpO2 94%   BMI 39.87 kg/m  Dilation: 3 Effacement (%): 60 Cervical Position: Anterior Station: -2 Presentation: Vertex Exam by:: T LYTLE RN  Assessment and Plan: 27 y.o. [id number] [redacted]w[redacted]d here with SROM. History of prior C/S for PPROM and Breech.   Labor: Expectant management. Will plan to start pitocin for augmentation.  -- pain control: plans for epidural  Fetal Well-Being: EFW 8-9lbs by Le[place]. Cephalic by prior checks.  -- Category I - continuous fetal monitoring  -- GBS negative    Laurel S. Earlene Plater, DO OB/GYN Fellow, Faculty Practice  7:12 AM

## 2018-08-05 NOTE — Anesthesia Procedure Notes (Signed)
Epidural Patient location during procedure: OB Start time: 08/05/2018 8:58 AM End time: 08/05/2018 9:16 AM  Staffing Anesthesiologist: Lucretia Kern, MD Performed: anesthesiologist   Preanesthetic Checklist Completed: patient identified, pre-op evaluation, timeout performed, IV checked, risks and benefits discussed and monitors and equipment checked  Epidural Patient position: sitting Prep: DuraPrep Patient monitoring: heart rate, continuous pulse ox and blood pressure Approach: midline Location: L3-L4 Injection technique: LOR saline  Needle:  Needle type: Tuohy  Needle gauge: 17 G Needle length: 9 cm Needle insertion depth: 6 cm Catheter type: closed end flexible Catheter size: 19 Gauge Catheter at skin depth: 11 cm  Assessment Events: blood not aspirated, injection not painful, no injection resistance, negative IV test and no paresthesia  Additional Notes Reason for block:procedure for pain

## 2018-08-05 NOTE — H&P (Addendum)
OBSTETRIC ADMISSION HISTORY AND PHYSICAL  Katherine Villegas is a 27 y.o. female [phone] with IUP at [redacted]w[redacted]d by L/7 presenting for ROM, contractions. She reports +FMs, No LOF, no VB, no blurry vision, headaches or peripheral edema, and RUQ pain.  She plans on breast feeding. She request BTL for birth control.  She received her [facility] at [facility]  Dating: By L/7 --->  Estimated Date of Delivery: 08/23/18 Sono:  @[redacted]w[redacted]d ,  normal anatomy, cephalic presentation,  251g, 14% EFW  Prenatal History/Complications: Previous c/s due to breech Migraine Tobacco use  Past Medical History: Past Medical History:  Diagnosis Date  . Headache     Past Surgical History: Past Surgical History:  Procedure Laterality Date  . CESAREAN SECTION    . EXTERNAL EAR SURGERY    . TONSILLECTOMY    . WISDOM TOOTH EXTRACTION      Obstetrical History: OB History    Gravida  5   Para  2   Term  1   Preterm  1   AB  2   Living  2     SAB  2   TAB      Ectopic      Multiple  0   Live Births  2           Social History: Social History   Socioeconomic History  . Marital status: Married    Spouse name: Not on file  . Number of children: Not on file  . Years of education: Not on file  . Highest education level: Not on file  Occupational History  . Not on file  Social Needs  . Financial resource strain: Not on file  . Food insecurity:    Worry: Not on file    Inability: Not on file  . Transportation needs:    Medical: Not on file    Non-medical: Not on file  Tobacco Use  . Smoking status: Current Some Day Smoker    Packs/day: 0.25    Years: 15.00    Pack years: 3.75    Types: Cigarettes  . Smokeless tobacco: Never Used  Substance and Sexual Activity  . Alcohol use: No    Alcohol/week: 0.0 standard drinks    Comment: rarely  . Drug use: No  . Sexual activity: Yes    Partners: Male    Birth control/protection: None  Lifestyle  . Physical activity:    Days per week:  Not on file    Minutes per session: Not on file  . Stress: Not on file  Relationships  . Social connections:    Talks on phone: Not on file    Gets together: Not on file    Attends religious service: Not on file    Active member of club or organization: Not on file    Attends meetings of clubs or organizations: Not on file    Relationship status: Not on file  Other Topics Concern  . Not on file  Social History Narrative  . Not on file    Family History: Family History  Problem Relation Age of Onset  . Migraines Mother   . Hypertension Mother   . Hyperlipidemia Mother   . Alcohol abuse Mother   . Diabetes Maternal Grandfather   . Dementia Maternal Grandfather   . Alcohol abuse Maternal Grandfather   . Alcohol abuse Father   . Arthritis Maternal Grandmother   . COPD Paternal Grandmother   . Migraines Maternal Aunt   . Alcohol  abuse Paternal Aunt   . Arthritis Paternal Aunt   . Arthritis Paternal Uncle     Allergies: Allergies  Allergen Reactions  . Amoxicillin Other (See Comments)    Patient states it doesn't work due to overuse.  . Latex Itching, Swelling and Rash    Medications Prior to Admission  Medication Sig Dispense Refill Last Dose  . acetaminophen (TYLENOL) 325 MG tablet Take 650 mg by mouth every 6 (six) hours as needed for mild pain or headache.    Past Week at Unknown time  . Prenatal MV & Min w/FA-DHA (PRENATAL ADULT GUMMY/DHA/FA PO) Take 2 each by mouth daily.    08/04/2018 at Unknown time     Review of Systems   All systems reviewed and negative except as stated in HPI  Blood pressure 126/75, pulse 82, temperature 97.8 F (36.6 C), temperature source Oral, resp. rate 18, height 5\' 2"  (1.575 m), weight 98.9 kg, last menstrual period 11/16/2017, SpO2 100 %. General appearance: alert, cooperative, appears stated age and no distress Lungs: clear to auscultation bilaterally Heart: regular rate and rhythm Abdomen: soft, non-tender; bowel sounds  normal Pelvic: see below Extremities: Homans sign is negative, no sign of DVT Presentation: cephalic by CVE Fetal monitoringBaseline: 130 bpm, Variability: Good {> 6 bpm), Accelerations: Reactive and Decelerations: Absent Uterine activityFrequency: Every 2-5 minutes Dilation: 3 Effacement (%): 60 Station: -2 Exam by:: T LYTLE RN   Prenatal labs: ABO, Rh: O/Positive/-- (03/15 1019) Antibody: Negative (07/05 0910) Rubella: 1.80 (03/15 1019) RPR: Non Reactive (07/05 0910)  HBsAg: Negative (03/15 1019)  HIV: Non Reactive (07/05 0910)  GBS:   negative 2 hr Glucola 88/162/151, normal Genetic screening  negative Anatomy US normal  Prenatal Transfer Tool  Maternal Diabetes: No Genetic Screening: Normal Maternal Ultrasounds/Referrals: Normal Fetal Ultrasounds or other Referrals:  None Maternal Substance Abuse:  Yes:  Type: Smoker ~1PPD Significant Maternal Medications:  None Significant Maternal Lab Results: None  Results for orders placed or performed during the hospital encounter of 08/05/18 (from the past 24 hour(s))  POCT fern test   Collection Time: 08/05/18  2:21 AM  Result Value Ref Range   POCT Fern Test Positive = ruptured amniotic membanes     Patient Active Problem List   Diagnosis Date Noted  . PP sterilization Consent 8/2 06/23/2018  . Supervision of normal pregnancy 01/20/2018  . Previous cesarean section complicating pregnancy 10/13/2015  . H/O preterm delivery, currently pregnant 10/13/2015  . Intractable migraine with aura without status migrainosus 02/28/2015    Assessment/Plan:  Katherine Villegas is a 27 y.o. G3T5176 at [redacted]w[redacted]d here for SROM.  #Labor: SROM per ferning positive. Pt would like to attempt TOLAC, one previous VBAC, will manage expectantly  #Pain: Planning epidural #FWB: Category I #ID:  GBS negative #MOF: breast #MOC:BTL #Circ:  N/A, girl  Mirian Mo, MD  08/05/2018, 2:26 AM  Attestation: I have seen this patient and agree with the  resident's documentation. I have examined them separately, and we have discussed the plan of care.  Cristal Deer. Earlene Plater, DO OB/GYN Fellow

## 2018-08-05 NOTE — Anesthesia Pain Management Evaluation Note (Signed)
  CRNA Pain Management Visit Note  Patient: Katherine Villegas, 27 y.o., female  "Hello I am a member of the anesthesia team at [facility]. We have an anesthesia team available at all times to provide care throughout the hospital, including epidural management and anesthesia for C-section. I don't know your plan for the delivery whether it a natural birth, water birth, IV sedation, nitrous supplementation, doula or epidural, but we want to meet your pain goals."   1.Was your pain managed to your expectations on prior hospitalizations?   Yes   2.What is your expectation for pain management during this hospitalization?     Epidural  3.How can we help you reach that goal? Epidural   Record the patient's initial score and the patient's pain goal.   Pain: 8  Pain Goal: 4 [facility] wants you to be able to say your pain was always managed very well.  Rica Records 08/05/2018

## 2018-08-06 ENCOUNTER — Encounter (HOSPITAL_COMMUNITY)
Admission: AD | Disposition: A | Discharge status: Home / Self Care | Payer: Self-pay | Source: Home / Self Care | Attending: Family Medicine

## 2018-08-06 ENCOUNTER — Encounter (HOSPITAL_COMMUNITY): Payer: Self-pay | Admitting: Certified Registered"

## 2018-08-06 ENCOUNTER — Inpatient Hospital Stay (HOSPITAL_COMMUNITY): Payer: PRIVATE HEALTH INSURANCE | Admitting: Certified Registered"

## 2018-08-06 HISTORY — PX: TUBAL LIGATION: SHX77

## 2018-08-06 LAB — RPR: RPR Ser Ql: NONREACTIVE

## 2018-08-06 SURGERY — LIGATION, FALLOPIAN TUBE, POSTPARTUM
Anesthesia: Epidural | Laterality: Bilateral

## 2018-08-06 MED ORDER — BUPIVACAINE HCL (PF) 0.25 % IJ SOLN
INTRAMUSCULAR | Status: AC
  Filled 2018-08-06: qty 10

## 2018-08-06 MED ORDER — LACTATED RINGERS IV SOLN
INTRAVENOUS | Status: DC
  Administered 2018-08-06: 10 mL/h via INTRAVENOUS

## 2018-08-06 MED ORDER — SODIUM BICARBONATE 8.4 % IV SOLN
INTRAVENOUS | Status: DC | PRN
  Administered 2018-08-06 (×2): 5 mL via EPIDURAL
  Administered 2018-08-06: 3 mL via EPIDURAL
  Administered 2018-08-06: 7 mL via EPIDURAL

## 2018-08-06 MED ORDER — ONDANSETRON HCL 4 MG/2ML IJ SOLN
INTRAMUSCULAR | Status: AC
  Filled 2018-08-06: qty 2

## 2018-08-06 MED ORDER — BUPIVACAINE HCL (PF) 0.25 % IJ SOLN
INTRAMUSCULAR | Status: DC | PRN
  Administered 2018-08-06: 10 mL

## 2018-08-06 MED ORDER — FENTANYL CITRATE (PF) 100 MCG/2ML IJ SOLN
25.0000 ug | INTRAMUSCULAR | Status: DC | PRN
  Administered 2018-08-06 (×2): 50 ug via INTRAVENOUS

## 2018-08-06 MED ORDER — ACETAMINOPHEN 160 MG/5ML PO SOLN: 325.0000 mg | ORAL | Status: DC | PRN

## 2018-08-06 MED ORDER — FENTANYL CITRATE (PF) 250 MCG/5ML IJ SOLN
INTRAMUSCULAR | Status: DC | PRN
  Administered 2018-08-06: 25 ug via INTRAVENOUS

## 2018-08-06 MED ORDER — ONDANSETRON HCL 4 MG/2ML IJ SOLN: 4.0000 mg | Freq: Once | INTRAMUSCULAR | Status: DC | PRN

## 2018-08-06 MED ORDER — MEPERIDINE HCL 25 MG/ML IJ SOLN: 6.2500 mg | INTRAMUSCULAR | Status: DC | PRN

## 2018-08-06 MED ORDER — FENTANYL CITRATE (PF) 100 MCG/2ML IJ SOLN
INTRAMUSCULAR | Status: AC
  Filled 2018-08-06: qty 2

## 2018-08-06 MED ORDER — ONDANSETRON HCL 4 MG/2ML IJ SOLN
INTRAMUSCULAR | Status: DC | PRN
  Administered 2018-08-06: 4 mg via INTRAVENOUS

## 2018-08-06 MED ORDER — ACETAMINOPHEN 325 MG PO TABS: 325.0000 mg | ORAL_TABLET | ORAL | Status: DC | PRN

## 2018-08-06 MED ORDER — BUPIVACAINE-EPINEPHRINE (PF) 0.25% -1:200000 IJ SOLN
INTRAMUSCULAR | Status: AC
  Filled 2018-08-06: qty 30

## 2018-08-06 MED ORDER — OXYCODONE HCL 5 MG/5ML PO SOLN: 5.0000 mg | Freq: Once | ORAL | Status: DC | PRN

## 2018-08-06 MED ORDER — OXYCODONE HCL 5 MG PO TABS: 5.0000 mg | ORAL_TABLET | Freq: Once | ORAL | Status: DC | PRN

## 2018-08-06 MED ORDER — SUCCINYLCHOLINE CHLORIDE 20 MG/ML IJ SOLN
INTRAMUSCULAR | Status: DC | PRN
  Administered 2018-08-06: 200 mg via INTRAVENOUS

## 2018-08-06 MED ORDER — FAMOTIDINE 20 MG PO TABS
40.0000 mg | ORAL_TABLET | Freq: Once | ORAL | Status: AC
  Administered 2018-08-06: 40 mg via ORAL
  Filled 2018-08-06: qty 2

## 2018-08-06 MED ORDER — PROPOFOL 500 MG/50ML IV EMUL
INTRAVENOUS | Status: DC | PRN
  Administered 2018-08-06: 150 ug via INTRAVENOUS

## 2018-08-06 MED ORDER — METOCLOPRAMIDE HCL 10 MG PO TABS
10.0000 mg | ORAL_TABLET | Freq: Once | ORAL | Status: AC
  Administered 2018-08-06: 10 mg via ORAL
  Filled 2018-08-06: qty 1

## 2018-08-06 MED ORDER — LACTATED RINGERS IV SOLN
INTRAVENOUS | Status: DC | PRN
  Administered 2018-08-06: 10:00:00 via INTRAVENOUS

## 2018-08-06 MED ORDER — IBUPROFEN 600 MG PO TABS: 600.0000 mg | ORAL_TABLET | Freq: Four times a day (QID) | ORAL | 0 refills | Status: DC

## 2018-08-06 SURGICAL SUPPLY — 29 items
ADH SKN CLS APL DERMABOND .7 (GAUZE/BANDAGES/DRESSINGS) ×1
BLADE SURG 15 STRL LF C SS BP (BLADE) ×1 IMPLANT
BLADE SURG 15 STRL SS (BLADE) ×3
CLOTH BEACON ORANGE TIMEOUT ST (SAFETY) ×3 IMPLANT
DERMABOND ADVANCED (GAUZE/BANDAGES/DRESSINGS) ×2
DERMABOND ADVANCED .7 DNX12 (GAUZE/BANDAGES/DRESSINGS) ×1 IMPLANT
DRSG OPSITE POSTOP 3X4 (GAUZE/BANDAGES/DRESSINGS) ×3 IMPLANT
ELECT REM PT RETURN 9FT ADLT (ELECTROSURGICAL)
ELECTRODE REM PT RTRN 9FT ADLT (ELECTROSURGICAL) IMPLANT
GLOVE BIOGEL PI IND STRL 7.0 (GLOVE) ×1 IMPLANT
GLOVE BIOGEL PI IND STRL 8 (GLOVE) ×1 IMPLANT
GLOVE BIOGEL PI INDICATOR 7.0 (GLOVE) ×2
GLOVE BIOGEL PI INDICATOR 8 (GLOVE) ×2
GLOVE ECLIPSE 8.0 STRL XLNG CF (GLOVE) ×3 IMPLANT
GOWN STRL REUS W/TWL LRG LVL3 (GOWN DISPOSABLE) ×6 IMPLANT
NEEDLE HYPO 22GX1.5 SAFETY (NEEDLE) IMPLANT
NS IRRIG 1000ML POUR BTL (IV SOLUTION) ×3 IMPLANT
PACK ABDOMINAL MINOR (CUSTOM PROCEDURE TRAY) ×3 IMPLANT
PENCIL BUTTON HOLSTER BLD 10FT (ELECTRODE) IMPLANT
PROTECTOR NERVE ULNAR (MISCELLANEOUS) ×3 IMPLANT
SPONGE LAP 4X18 RFD (DISPOSABLE) IMPLANT
SUT PLAIN 2 0 (SUTURE) ×6
SUT PLAIN ABS 2-0 CT1 27XMFL (SUTURE) ×2 IMPLANT
SUT VIC AB 0 CT1 27 (SUTURE) ×3
SUT VIC AB 0 CT1 27XBRD ANBCTR (SUTURE) ×1 IMPLANT
SUT VIC AB 4-0 KS 27 (SUTURE) ×3 IMPLANT
SYR CONTROL 10ML LL (SYRINGE) IMPLANT
TOWEL OR 17X24 6PK STRL BLUE (TOWEL DISPOSABLE) ×6 IMPLANT
TRAY FOLEY CATH SILVER 14FR (SET/KITS/TRAYS/PACK) ×3 IMPLANT

## 2018-08-06 NOTE — Anesthesia Postprocedure Evaluation (Signed)
Anesthesia Post Note  Patient: Katherine Villegas  Procedure(s) Performed: AN AD HOC LABOR EPIDURAL     Patient location during evaluation: Mother Baby Anesthesia Type: Epidural Level of consciousness: awake and alert and oriented Pain management: satisfactory to patient Vital Signs Assessment: post-procedure vital signs reviewed and stable Respiratory status: respiratory function stable Cardiovascular status: stable Postop Assessment: no headache, no backache, epidural receding, patient able to bend at knees, no signs of nausea or vomiting and adequate PO intake Anesthetic complications: no    Last Vitals:  Vitals:   08/06/18 0605 08/06/18 0910  BP: 108/68 103/70  Pulse: 74 (!) 55  Resp: 17 18  Temp: 36.7 C 36.5 C  SpO2: 97%     Last Pain:  Vitals:   08/06/18 0910  TempSrc: Oral  PainSc: 0-No pain   Pain Goal:                 Katherine Villegas

## 2018-08-06 NOTE — Anesthesia Procedure Notes (Signed)
Procedure Name: Intubation Date/Time: 08/06/2018 10:14 AM Performed by: Janeece Riggers, MD Pre-anesthesia Checklist: Patient identified, Emergency Drugs available, Suction available, Patient being monitored and Timeout performed Patient Re-evaluated:Patient Re-evaluated prior to induction Oxygen Delivery Method: Circle system utilized Preoxygenation: Pre-oxygenation with 100% oxygen Induction Type: IV induction, Rapid sequence and Cricoid Pressure applied Laryngoscope Size: Mac and 3 Grade View: Grade I Tube type: Oral Endobronchial tube: Right Tube size: 7.0 mm Number of attempts: 1 Airway Equipment and Method: Patient positioned with wedge pillow and Stylet Placement Confirmation: ETT inserted through vocal cords under direct vision Secured at: 22 cm Tube secured with: Tape Dental Injury: Teeth and Oropharynx as per pre-operative assessment

## 2018-08-06 NOTE — Anesthesia Preprocedure Evaluation (Signed)
Anesthesia Evaluation  Patient identified by MRN, date of birth, ID band Patient awake    Reviewed: Allergy & Precautions, H&P , NPO status , Patient's Chart, lab work & pertinent test results  History of Anesthesia Complications Negative for: history of anesthetic complications  Airway Mallampati: II  TM Distance: >3 FB Neck ROM: full    Dental no notable dental hx.    Pulmonary neg pulmonary ROS, Current Smoker,    Pulmonary exam normal        Cardiovascular negative cardio ROS Normal cardiovascular exam Rhythm:regular Rate:Normal     Neuro/Psych negative neurological ROS  negative psych ROS   GI/Hepatic negative GI ROS, Neg liver ROS,   Endo/Other  negative endocrine ROS  Renal/GU negative Renal ROS  negative genitourinary   Musculoskeletal   Abdominal   Peds  Hematology negative hematology ROS (+)   Anesthesia Other Findings   Reproductive/Obstetrics (+) Pregnancy                             Anesthesia Physical Anesthesia Plan  ASA: II  Anesthesia Plan: Epidural   Post-op Pain Management:    Induction:   PONV Risk Score and Plan:   Airway Management Planned:   Additional Equipment:   Intra-op Plan:   Post-operative Plan:   Informed Consent: I have reviewed the patients History and Physical, chart, labs and discussed the procedure including the risks, benefits and alternatives for the proposed anesthesia with the patient or authorized representative who has indicated his/her understanding and acceptance.       Plan Discussed with:   Anesthesia Plan Comments:         Anesthesia Quick Evaluation  

## 2018-08-06 NOTE — Lactation Note (Addendum)
This note was copied from a baby's chart. Lactation Consultation Note  Patient Name: Katherine Villegas [id number] Date: 08/06/2018 Reason for consult: Follow-up assessment   LC entered room to assist w/ breastfeeding.  Mother declined help stating baby recently breastfed.  P3, Baby 25 hours old and mother had BTL today.  Mother states she will breastfed and formula feed until infant's lip tie is clipped. She states she is waiting on the Pediatrician to perform oral assessment.  Mother does not want to pump with DEBP.  Mom has my # to call for assist w/next feeding.    Maternal Data    Feeding    LATCH Score                   Interventions    Lactation Tools Discussed/Used     Consult Status Consult Status: Follow-up Date: 08/07/18 Follow-up type: In-patient    Dahlia Byes [facility] 08/06/2018, 1:34 PM

## 2018-08-06 NOTE — Op Note (Signed)
  Preoperative diagnosis:  Multiparous female who desires permanent sterilization  Postoperative diagnosis:  Same as above  Procedure:  Postpartum partum bilateral tubal ligation using modified Pomeroy technique  Surgeon:  Lazaro Arms  Asst.:    Anesthesia:  Epidural dosed, failed, GET  Findings:  Patient had a normal postpartum uterus tubes and ovaries.  Description of operation:  Patient was taken to the operating room where she had her epidural dosed.  She was then placed in the supine position.  She was then prepped and draped in the usual sterile fashion and a Foley catheter was placed in the bladder after the epidural was dosed. There was inadequate anesthesia so anesthesia used GET anesthesia.  A semilunar incision was made just below the umbilicus and taken down sharply to the fascia which was incised.  The peritoneum was then entered manually.  The right fallopian tube was identified including the fimbriated end.  The right fallopian tube was grasped and a 2-1/2 cm segment was removed using the modified Pomeroy technique.  There was good hemostasis.  Attention was then turned to the left fallopian tube which was identified including the fimbriated end.  A 2-1/2 cm segment in the distal isthmic and ampullary region portion of the tube was removed again using a modified Pomeroy technique.  There was good hemostasis.    The subcutaneous tissue fascia and peritoneum were closed using 0 vicryl in a running fashion.  The subcutaneous tissue was reapproximated with interrupted 3-0 Monocryl sutures.  The skin was closed using 4-0 Vicryl on a Keith needle in a subcuticular fashion.  Dermabond was placed for additional wound integrity and also to serve as a bandage.  The patient was taken to the recovery room in good stable condition.  All counts were correct.  Blood loss was minimal.    Lazaro Arms, MD 08/06/2018 10:38 AM

## 2018-08-06 NOTE — Lactation Note (Signed)
This note was copied from a baby's chart. Lactation Consultation Note  Patient Name: Katherine Villegas [id number] Date: 08/06/2018 Reason for consult: Follow-up assessment;Early term 37-38.6wks P3, 15 hour female , ETI  LC entered room, mom holding baby swaddle in her arms. Mom very sleepy and tired. LC ask permission lay baby in basinet mom is agreeable. Per mom, she feed baby for 15 mins. at 2:30 am. Mom is  tired and sleepy,  request LC  To inform nurse family  do not want be disturb at least  for 2 hours. Per mom, she  has a scheduled tubulization in morning and wants have some rest.   LC discussed w/ mom hand expression and mom was pulling and twisting her nipple. LC ask mom permission to touch Mom's  breast and help in demonstration.  Mom did not  Like hand expression technique and  LC immediately stop.  LC ask mom call for assistance when  she is ready for help with breastfeeding  assistance in latching infant to breast.  Maternal Data    Feeding    LATCH Score                   Interventions    Lactation Tools Discussed/Used     Consult Status Consult Status: Follow-up Date: 08/06/18 Follow-up type: In-patient    Danelle Earthly 08/06/2018, 3:33 AM

## 2018-08-06 NOTE — Anesthesia Postprocedure Evaluation (Signed)
Anesthesia Post Note  Patient: Audra Rake  Procedure(s) Performed: AN AD HOC LABOR EPIDURAL     Patient location during evaluation: Mother Baby Anesthesia Type: Epidural Level of consciousness: awake and alert and oriented Pain management: satisfactory to patient Vital Signs Assessment: post-procedure vital signs reviewed and stable Respiratory status: respiratory function stable Cardiovascular status: stable Postop Assessment: no headache, no backache, epidural receding, patient able to bend at knees, no signs of nausea or vomiting and adequate PO intake Anesthetic complications: no    Last Vitals:  Vitals:   08/06/18 0605 08/06/18 0910  BP: 108/68 103/70  Pulse: 74 (!) 55  Resp: 17 18  Temp: 36.7 C 36.5 C  SpO2: 97%     Last Pain:  Vitals:   08/06/18 0910  TempSrc: Oral  PainSc: 0-No pain   Pain Goal:                 Darnell Jeschke     

## 2018-08-06 NOTE — Progress Notes (Signed)
POSTPARTUM PROGRESS NOTE  Post Partum Day 1  Subjective:  Katherine Villegas is a 27 y.o. [id number] s/p VBAC at [redacted]w[redacted]d.  She reports she is doing well. No acute events overnight. She denies any problems with ambulating, voiding or po intake. Denies nausea or vomiting.  Pain is well controlled.  Lochia is appropriate.  Objective: Blood pressure 108/68, pulse 74, temperature 98 F (36.7 C), temperature source Oral, resp. rate 17, height 5\' 2"  (1.575 m), weight 98.9 kg, last menstrual period 11/16/2017, SpO2 97 %, unknown if currently breastfeeding.  Physical Exam:  General: alert, cooperative and no distress Chest: no respiratory distress Heart:regular rate, distal pulses intact Abdomen: soft, nontender,  Uterine Fundus: firm, appropriately tender DVT Evaluation: No calf swelling or tenderness Extremities: No LE edema Skin: warm, dry  Recent Labs    08/05/18 0252  HGB 12.2  HCT 36.7    Assessment/Plan: Katherine Villegas is a 27 y.o. [id number] s/p SVD at [redacted]w[redacted]d   PPD#1 - Doing well  Routine postpartum care Contraception: PP BTL today  Feeding: breast Dispo: Plan for discharge tomorrow.   LOS: 1 day   Marcy Siren, D.O. OB Fellow  08/06/2018, 8:39 AM

## 2018-08-06 NOTE — Discharge Summary (Signed)
OB Discharge Summary  Patient Name: Katherine Villegas DOB: 1991-04-10 MRN: [id number]  Date of admission: 08/05/2018 Delivering MD: Thressa Sheller D   Date of discharge: 08/06/2018  Admitting diagnosis: 37 WKS WATER BROKE Intrauterine pregnancy: [redacted]w[redacted]d     Secondary diagnosis:Active Problems:   Premature rupture of membranes  Additional problems:none     Discharge diagnosis: Term Pregnancy Delivered and BTL                                                                     Post partum procedures:postpartum tubal ligation  Augmentation: Pitocin  Complications: None  Hospital course:  Onset of Labor With Vaginal Delivery     27 y.o. yo [id number] at [redacted]w[redacted]d was admitted in Latent Labor on 08/05/2018. Patient had an uncomplicated labor course as follows:  Membrane Rupture Time/Date: 11:00 PM ,08/04/2018   Intrapartum Procedures: Episiotomy: None [1]                                         Lacerations:  1st degree [2]  Patient had a delivery of a Viable infant. 08/05/2018  Information for the patient's newborn:  Lilianna, Case [[id number]]  Delivery Method: Vaginal, Spontaneous(Filed from Delivery Summary)    Pateint had an uncomplicated postpartum course.  She is ambulating, tolerating a regular diet, passing flatus, and urinating well. Patient is discharged home in stable condition on 08/06/18.   Physical exam  Vitals:   08/06/18 1130 08/06/18 1215 08/06/18 1230 08/06/18 1330  BP: 104/66 102/68 (!) 96/57 108/83  Pulse: 74 70 64 68  Resp: 19 15 16 18   Temp:   (!) 97.5 F (36.4 C) 98 F (36.7 C)  TempSrc:   Oral Oral  SpO2: 100% 100%    Weight:      Height:       General: alert, cooperative and no distress Lochia: appropriate Uterine Fundus: firm Incision: Dressing is clean, dry, and intact DVT Evaluation: No evidence of DVT seen on physical exam. Labs: Lab Results  Component Value Date   WBC 12.2 (H) 08/05/2018   HGB 12.2 08/05/2018   HCT 36.7 08/05/2018   MCV 88.6 08/05/2018   PLT 163 08/05/2018   CMP Latest Ref Rng & Units 09/14/2016  Glucose 70 - 99 mg/dL 324(M)  BUN 6 - 23 mg/dL 9  Creatinine 0.10 - 2.72 mg/dL 5.36  Sodium 644 - 034 mEq/L 139  Potassium 3.5 - 5.1 mEq/L 4.0  Chloride 96 - 112 mEq/L 105  CO2 19 - 32 mEq/L 26  Calcium 8.4 - 10.5 mg/dL 9.8    Discharge instruction: per After Visit Summary and "Baby and Me Booklet".  After Visit Meds:  Allergies as of 08/06/2018      Reactions   Amoxicillin Other (See Comments)   Patient states it doesn't work due to overuse.   Latex Itching, Swelling, Rash      Medication List    TAKE these medications   acetaminophen 325 MG tablet Commonly known as:  TYLENOL Take 650 mg by mouth every 6 (six) hours as needed for mild pain or headache.   ibuprofen 600 MG  tablet Commonly known as:  ADVIL,MOTRIN Take 1 tablet (600 mg total) by mouth every 6 (six) hours.   PRENATAL ADULT GUMMY/DHA/FA PO Take 2 each by mouth daily.       Diet: routine diet  Activity: Advance as tolerated. Pelvic rest for 6 weeks.   Outpatient follow up:6 weeks Follow up Appt: Future Appointments  Date Time Provider Department Center  08/08/2018  8:45 AM Lazaro Arms, MD [facility]   Follow up visit: No follow-ups on file.  Postpartum contraception: Tubal Ligation  Newborn Data: Live born female  Birth Weight: 6 lb 13.7 oz (3110 g) APGAR: 8, 9  Newborn Delivery   Birth date/time:  08/05/2018 12:15:00 Delivery type:  Vaginal, Spontaneous     Baby Feeding: Breast Disposition:home with mother   08/06/2018 Wyvonnia Dusky, CNM

## 2018-08-06 NOTE — Anesthesia Postprocedure Evaluation (Signed)
Anesthesia Post Note  Patient: Katherine Villegas  Procedure(s) Performed: POST PARTUM TUBAL LIGATION (Bilateral )     Patient location during evaluation: PACU Anesthesia Type: General Level of consciousness: awake and alert Pain management: pain level controlled Vital Signs Assessment: post-procedure vital signs reviewed and stable Respiratory status: spontaneous breathing, nonlabored ventilation, respiratory function stable and patient connected to nasal cannula oxygen Cardiovascular status: blood pressure returned to baseline and stable Postop Assessment: no apparent nausea or vomiting Anesthetic complications: no    Last Vitals:  Vitals:   08/06/18 1048 08/06/18 1100  BP: 110/68 102/61  Pulse: 91 80  Resp: 20 18  Temp: 36.5 C   SpO2: 98% 97%    Last Pain:  Vitals:   08/06/18 1048  TempSrc: Oral  PainSc: 5    Pain Goal:        RLE Motor Response: Purposeful movement (08/06/18 1115)   L Sensory Level: L3-Anterior knee, lower leg (08/06/18 1115) R Sensory Level: L3-Anterior knee, lower leg (08/06/18 1115)  Safaa Stingley

## 2018-08-06 NOTE — Anesthesia Postprocedure Evaluation (Signed)
Anesthesia Post Note  Patient: Katherine Villegas  Procedure(s) Performed: POST PARTUM TUBAL LIGATION (Bilateral )     Patient location during evaluation: PACU Anesthesia Type: General Level of consciousness: awake and alert Pain management: pain level controlled Vital Signs Assessment: post-procedure vital signs reviewed and stable Respiratory status: spontaneous breathing, nonlabored ventilation, respiratory function stable and patient connected to nasal cannula oxygen Cardiovascular status: blood pressure returned to baseline and stable Postop Assessment: no apparent nausea or vomiting Anesthetic complications: no    Last Vitals:  Vitals:   08/06/18 1230 08/06/18 1330  BP: (!) 96/57 108/83  Pulse: 64 68  Resp: 16 18  Temp: (!) 36.4 C 36.7 C  SpO2:      Last Pain:  Vitals:   08/06/18 1415  TempSrc:   PainSc: 8    Pain Goal:                 Ermin Parisien

## 2018-08-06 NOTE — Transfer of Care (Signed)
Immediate Anesthesia Transfer of Care Note  Patient: Katherine Villegas  Procedure(s) Performed: POST PARTUM TUBAL LIGATION (Bilateral )  Patient Location: PACU  Anesthesia Type:General  Level of Consciousness: awake, alert  and oriented  Airway & Oxygen Therapy: Patient Spontanous Breathing and Patient connected to nasal cannula oxygen  Post-op Assessment: Report given to RN  Post vital signs: Reviewed and stable  Last Vitals:  Vitals Value Taken Time  BP    Temp    Pulse    Resp    SpO2      Last Pain:  Vitals:   08/06/18 0910  TempSrc: Oral  PainSc: 0-No pain         Complications: No apparent anesthesia complications

## 2018-08-07 ENCOUNTER — Encounter (HOSPITAL_COMMUNITY): Payer: Self-pay

## 2018-08-08 ENCOUNTER — Encounter (HOSPITAL_COMMUNITY): Payer: Self-pay | Admitting: Obstetrics & Gynecology

## 2018-08-08 ENCOUNTER — Encounter: Payer: PRIVATE HEALTH INSURANCE | Admitting: Obstetrics & Gynecology

## 2018-08-11 ENCOUNTER — Telehealth: Payer: Self-pay | Admitting: Obstetrics & Gynecology

## 2018-08-11 NOTE — Telephone Encounter (Signed)
Patient called, stated that she had a tubal done Sunday, 08/06/18 and she is having some bleeding and moistness at the surgical site.  She stated it is not puffy and it does not have an odor to it.  [facility]  [phone]

## 2018-08-11 NOTE — Telephone Encounter (Signed)
Pt called stating that she had some light fluid leaking from her BTL incision after she coughed. She states that it only happened the one time. She states there is no longer any drainage, no odor, no redness and it is not separated. Advised to call back for any of those issues. Pt verbalized understanding.

## 2018-09-11 ENCOUNTER — Encounter: Payer: Self-pay | Admitting: Women's Health

## 2018-09-11 ENCOUNTER — Ambulatory Visit (INDEPENDENT_AMBULATORY_CARE_PROVIDER_SITE_OTHER): Payer: PRIVATE HEALTH INSURANCE | Admitting: Women's Health

## 2018-09-11 ENCOUNTER — Other Ambulatory Visit: Payer: Self-pay

## 2018-09-11 DIAGNOSIS — L7682 Other postprocedural complications of skin and subcutaneous tissue: Secondary | ICD-10-CM | POA: Diagnosis not present

## 2018-09-11 MED ORDER — SULFAMETHOXAZOLE-TRIMETHOPRIM 800-160 MG PO TABS: 1.0000 | ORAL_TABLET | Freq: Two times a day (BID) | ORAL | 0 refills | Status: DC

## 2018-09-11 NOTE — Progress Notes (Signed)
POSTPARTUM VISIT Patient name: Katherine Villegas MRN [id number]  Date of birth: Nov 24, 1990 Chief Complaint:   Postpartum Care  History of Present Illness:   Katherine Villegas is a 27 y.o. [phone] Caucasian female being seen today for a postpartum visit. She is 5 weeks postpartum following a vaginal birth after cesarean (VBAC) at 37.3 gestational weeks. Anesthesia: epidural. I have fully reviewed the prenatal and intrapartum course. Pregnancy uncomplicated. Postpartum course has been uncomplicated. Does report BTL incision was healing well, then about 1-2wks ago, started noticing pain on Rt side of incision, then turned purple and became swollen, then started oozing just recently, has been squeezing to try to get fluid out. Denies fever/chills, pain has improved.  Bleeding no bleeding. Bowel function is normal. Bladder function is normal.  Patient is not sexually active. Last sexual activity: prior to birth of baby.  Contraception method is tubal ligation.  [place] Postpartum Depression Screening: negative. Score 5.   Last pap 08/02/17.  Results were normal .  Patient's last menstrual period was 11/16/2017.  Baby's course has been uncomplicated. Baby is feeding by bottle.  Review of Systems:   Pertinent items are noted in HPI Denies Abnormal vaginal discharge w/ itching/odor/irritation, headaches, visual changes, shortness of breath, chest pain, abdominal pain, severe nausea/vomiting, or problems with urination or bowel movements. Pertinent History Reviewed:  Reviewed past medical,surgical, obstetrical and family history.  Reviewed problem list, medications and allergies. OB History  Gravida Para Term Preterm AB Living  5 3 2 1 2 3   SAB TAB Ectopic Multiple Live Births  2     0 3    # Outcome Date GA Lbr Len/2nd Weight Sex Delivery Anes PTL Lv  5 Term 08/05/18 [redacted]w[redacted]d 13:10 / 00:05 6 lb 13.7 oz (3.11 kg) F Vag-Spont EPI  LIV  4 Term 05/19/16 [redacted]w[redacted]d 25:00 / 00:37 7 lb 5.3 oz (3.325 kg) F VBAC  EPI N LIV     Birth Comments: none  3 SAB 2015          2 SAB 2013          1 Preterm 08/23/10 [redacted]w[redacted]d  4 lb 9 oz (2.07 kg) F CS-Unspec Spinal Y LIV     Complications: Preterm premature rupture of membranes (PPROM) with unknown onset of labor, Breech presentation   Physical Assessment:   Vitals:   09/11/18 0843  BP: 104/66  Pulse: 93  Weight: 190 lb (86.2 kg)  Height: 5' 2.5" (1.588 m)  Body mass index is 34.2 kg/m.       Physical Examination:   General appearance: alert, well appearing, and in no distress  Mental status: alert, oriented to person, place, and time  Skin: warm & dry   Cardiovascular: normal heart rate noted   Respiratory: normal respiratory effort, no distress   Breasts: deferred, no complaints   Abdomen: soft, non-tender, Rt side of BTL incision w/ small pimple appearing bump, no current oozing, do see crust from where it had oozed earlier, is slightly tender, no erythema, no induration  Pelvic: VULVA: normal appearing vulva with no masses, tenderness or lesions, UTERUS: uterus is normal size, shape, consistency and nontender  Rectal: no hemorrhoids  Extremities: no edema       No results found for this or any previous visit (from the past 24 hour(s)).  Assessment & Plan:  1) Postpartum exam 2) 5 wks s/p VBAC and BTL 3) Bottlefeeding 4) Depression screening 5) BTL incisional pain/drainage> no seroma felt, no overwhelming s/s infection,  however will tx w/ septra ds bid x 7d, let us know if not improving  Meds:  Meds ordered this encounter  Medications  . sulfamethoxazole-trimethoprim (BACTRIM DS,SEPTRA DS) 800-160 MG tablet    Sig: Take 1 tablet by mouth 2 (two) times daily. X 7 days    Dispense:  14 tablet    Refill:  0    Order Specific Question:   Supervising Provider    Answer:   Lazaro Arms [[id number]]    Follow-up: Return in about 1 year (around 09/12/2019) for Physical.   No orders of the defined types were placed in this encounter.   Cheral Marker CNM, [facility] 09/11/2018 10:36 AM

## 2018-09-18 ENCOUNTER — Encounter: Payer: PRIVATE HEALTH INSURANCE | Admitting: Family Medicine

## 2019-01-04 ENCOUNTER — Other Ambulatory Visit (INDEPENDENT_AMBULATORY_CARE_PROVIDER_SITE_OTHER): Payer: Self-pay | Admitting: *Deleted

## 2019-05-31 ENCOUNTER — Encounter: Payer: Self-pay | Admitting: General Surgery

## 2019-05-31 ENCOUNTER — Other Ambulatory Visit: Payer: Self-pay

## 2019-05-31 ENCOUNTER — Ambulatory Visit (INDEPENDENT_AMBULATORY_CARE_PROVIDER_SITE_OTHER): Payer: PRIVATE HEALTH INSURANCE | Admitting: General Surgery

## 2019-05-31 VITALS — BP 103/71 | HR 83 | Temp 97.5°F | Resp 16 | Ht 63.0 in | Wt 198.0 lb

## 2019-05-31 DIAGNOSIS — K432 Incisional hernia without obstruction or gangrene: Secondary | ICD-10-CM

## 2019-05-31 NOTE — H&P (Signed)
Katherine Villegas; [id number]; 10/07/1991   HPI Patient is a 28-year-old white female who was referred to my care by Dr. Gosrani for evaluation treatment of an abdominal wall hernia.  Patient status post a tubal ligation in 2019 and states that over the past 3 months, she has had worsening pain and swelling around the umbilicus.  The pain is made worse with straining.  She does notice a swelling around the umbilicus.  No vomiting have been noted.  Her pain level is 5 out of 10. Past Medical History:  Diagnosis Date  . Headache     Past Surgical History:  Procedure Laterality Date  . CESAREAN SECTION    . EXTERNAL EAR SURGERY    . TONSILLECTOMY    . TUBAL LIGATION Bilateral 08/06/2018   Procedure: POST PARTUM TUBAL LIGATION;  Surgeon: Eure, Luther H, MD;  Location: [facility];  Service: Gynecology;  Laterality: Bilateral;  . WISDOM TOOTH EXTRACTION      Family History  Problem Relation Age of Onset  . Migraines Mother   . Hypertension Mother   . Hyperlipidemia Mother   . Alcohol abuse Mother   . Diabetes Maternal Grandfather   . Dementia Maternal Grandfather   . Alcohol abuse Maternal Grandfather   . Alcohol abuse Father   . Arthritis Maternal Grandmother   . COPD Paternal Grandmother   . Migraines Maternal Aunt   . Alcohol abuse Paternal Aunt   . Arthritis Paternal Aunt   . Arthritis Paternal Uncle     Current Outpatient Medications on File Prior to Visit  Medication Sig Dispense Refill  . acetaminophen (TYLENOL) 325 MG tablet Take 650 mg by mouth every 6 (six) hours as needed for mild pain or headache.     . NP THYROID 30 MG tablet Take 30 mg by mouth 2 (two) times daily.    . progesterone (PROMETRIUM) 200 MG capsule TAKE ONE CAPSULE BY MOUTH EVERY NIGHT.    . topiramate (TOPAMAX) 200 MG tablet Take 200 mg by mouth daily.    . ibuprofen (ADVIL,MOTRIN) 600 MG tablet Take 1 tablet (600 mg total) by mouth every 6 (six) hours. (Patient not taking: Reported on 05/31/2019) 30  tablet 0  . Prenatal MV & Min w/FA-DHA (PRENATAL ADULT GUMMY/DHA/FA PO) Take 2 each by mouth daily.     . sulfamethoxazole-trimethoprim (BACTRIM DS,SEPTRA DS) 800-160 MG tablet Take 1 tablet by mouth 2 (two) times daily. X 7 days (Patient not taking: Reported on 05/31/2019) 14 tablet 0   No current facility-administered medications on file prior to visit.     Allergies  Allergen Reactions  . Amoxicillin Other (See Comments)    Patient states it doesn't work due to overuse.  . Latex Itching, Swelling and Rash    Social History   Substance and Sexual Activity  Alcohol Use No  . Alcohol/week: 0.0 standard drinks   Comment: rarely    Social History   Tobacco Use  Smoking Status Current Some Day Smoker  . Packs/day: 0.50  . Years: 15.00  . Pack years: 7.50  . Types: Cigarettes  Smokeless Tobacco Never Used    Review of Systems  Constitutional: Negative.   HENT: Negative.   Eyes: Negative.   Respiratory: Negative.   Cardiovascular: Negative.   Gastrointestinal: Positive for abdominal pain, heartburn and nausea.  Genitourinary: Negative.   Musculoskeletal: Negative.   Skin: Negative.   Neurological: Positive for headaches.  Endo/Heme/Allergies: Negative.   Psychiatric/Behavioral: Negative.     Objective     Vitals:   05/31/19 0942  BP: 103/71  Pulse: 83  Resp: 16  Temp: (!) 97.5 F (36.4 C)  SpO2: 97%    Physical Exam Vitals signs reviewed.  Constitutional:      Appearance: Normal appearance. She is not ill-appearing.  HENT:     Head: Normocephalic and atraumatic.  Cardiovascular:     Rate and Rhythm: Normal rate and regular rhythm.     Heart sounds: Normal heart sounds. No murmur. No friction rub. No gallop.   Pulmonary:     Effort: Pulmonary effort is normal. No respiratory distress.     Breath sounds: Normal breath sounds. No stridor. No wheezing, rhonchi or rales.  Abdominal:     General: Abdomen is flat. Bowel sounds are normal. There is no  distension.     Palpations: Abdomen is soft. There is no mass.     Tenderness: There is no abdominal tenderness. There is no guarding or rebound.     Hernia: A hernia is present.     Comments: Moderate size umbilical hernia present below infraumbilical surgical scar.  The hernia seems to go to both the right and left of the umbilicus.  Laxity of the abdominal wall in this region is noted.  Skin:    General: Skin is warm and dry.  Neurological:     Mental Status: She is alert and oriented to person, place, and time.     Assessment  Incisional hernia Plan   Patient is scheduled for laparoscopic incisional herniorrhaphy with mesh on 06/04/2019.  The risks and benefits of the procedure including bleeding, infection, mesh use, and the possibility of recurrence of the hernia were fully explained to the patient, who gave informed consent.

## 2019-05-31 NOTE — Progress Notes (Signed)
Katherine Villegas; [id number]; Mar 20, 1991   HPI Patient is a 28 year old white female who was referred to my care by Dr. Karilyn CotaGosrani for evaluation treatment of an abdominal wall hernia.  Patient status post a tubal ligation in 2019 and states that over the past 3 months, she has had worsening pain and swelling around the umbilicus.  The pain is made worse with straining.  She does notice a swelling around the umbilicus.  No vomiting have been noted.  Her pain level is 5 out of 10. Past Medical History:  Diagnosis Date  . Headache     Past Surgical History:  Procedure Laterality Date  . CESAREAN SECTION    . EXTERNAL EAR SURGERY    . TONSILLECTOMY    . TUBAL LIGATION Bilateral 08/06/2018   Procedure: POST PARTUM TUBAL LIGATION;  Surgeon: Lazaro ArmsEure, Katherine H, MD;  [facility];  Service: Gynecology;  Laterality: Bilateral;  . WISDOM TOOTH EXTRACTION      Family History  Problem Relation Age of Onset  . Migraines Mother   . Hypertension Mother   . Hyperlipidemia Mother   . Alcohol abuse Mother   . Diabetes Maternal Grandfather   . Dementia Maternal Grandfather   . Alcohol abuse Maternal Grandfather   . Alcohol abuse Father   . Arthritis Maternal Grandmother   . COPD Paternal Grandmother   . Migraines Maternal Aunt   . Alcohol abuse Paternal Aunt   . Arthritis Paternal Aunt   . Arthritis Paternal Uncle     Current Outpatient Medications on File Prior to Visit  Medication Sig Dispense Refill  . acetaminophen (TYLENOL) 325 MG tablet Take 650 mg by mouth every 6 (six) hours as needed for mild pain or headache.     . NP THYROID 30 MG tablet Take 30 mg by mouth 2 (two) times daily.    . progesterone (PROMETRIUM) 200 MG capsule TAKE ONE CAPSULE BY MOUTH EVERY NIGHT.    Katherine Villegas. topiramate (TOPAMAX) 200 MG tablet Take 200 mg by mouth daily.    Katherine Villegas. ibuprofen (ADVIL,MOTRIN) 600 MG tablet Take 1 tablet (600 mg total) by mouth every 6 (six) hours. (Patient not taking: Reported on 05/31/2019) 30  tablet 0  . Prenatal MV & Min w/FA-DHA (PRENATAL ADULT GUMMY/DHA/FA PO) Take 2 each by mouth daily.     Katherine Villegas. sulfamethoxazole-trimethoprim (BACTRIM DS,SEPTRA DS) 800-160 MG tablet Take 1 tablet by mouth 2 (two) times daily. X 7 days (Patient not taking: Reported on 05/31/2019) 14 tablet 0   No current facility-administered medications on file prior to visit.     Allergies  Allergen Reactions  . Amoxicillin Other (See Comments)    Patient states it doesn't work due to overuse.  . Latex Itching, Swelling and Rash    Social History   Substance and Sexual Activity  Alcohol Use No  . Alcohol/week: 0.0 standard drinks   Comment: rarely    Social History   Tobacco Use  Smoking Status Current Some Day Smoker  . Packs/day: 0.50  . Years: 15.00  . Pack years: 7.50  . Types: Cigarettes  Smokeless Tobacco Never Used    Review of Systems  Constitutional: Negative.   HENT: Negative.   Eyes: Negative.   Respiratory: Negative.   Cardiovascular: Negative.   Gastrointestinal: Positive for abdominal pain, heartburn and nausea.  Genitourinary: Negative.   Musculoskeletal: Negative.   Skin: Negative.   Neurological: Positive for headaches.  Endo/Heme/Allergies: Negative.   Psychiatric/Behavioral: Negative.     Objective  Vitals:   05/31/19 0942  BP: 103/71  Pulse: 83  Resp: 16  Temp: (!) 97.5 F (36.4 C)  SpO2: 97%    Physical Exam Vitals signs reviewed.  Constitutional:      Appearance: Normal appearance. She is not ill-appearing.  HENT:     Head: Normocephalic and atraumatic.  Cardiovascular:     Rate and Rhythm: Normal rate and regular rhythm.     Heart sounds: Normal heart sounds. No murmur. No friction rub. No gallop.   Pulmonary:     Effort: Pulmonary effort is normal. No respiratory distress.     Breath sounds: Normal breath sounds. No stridor. No wheezing, rhonchi or rales.  Abdominal:     General: Abdomen is flat. Bowel sounds are normal. There is no  distension.     Palpations: Abdomen is soft. There is no mass.     Tenderness: There is no abdominal tenderness. There is no guarding or rebound.     Hernia: A hernia is present.     Comments: Moderate size umbilical hernia present below infraumbilical surgical scar.  The hernia seems to go to both the right and left of the umbilicus.  Laxity of the abdominal wall in this region is noted.  Skin:    General: Skin is warm and dry.  Neurological:     Mental Status: She is alert and oriented to person, place, and time.     Assessment  Incisional hernia Plan   Patient is scheduled for laparoscopic incisional herniorrhaphy with mesh on 06/04/2019.  The risks and benefits of the procedure including bleeding, infection, mesh use, and the possibility of recurrence of the hernia were fully explained to the patient, who gave informed consent.

## 2019-05-31 NOTE — Patient Instructions (Signed)
Laparoscopic Ventral Hernia Repair Laparoscopic ventral hernia repairis a procedure to fix a bulge of tissue that pushes through a weak area of muscle in the abdomen (ventral hernia). A ventral hernia may be at the belly button (umbilical), above the belly button (epigastric), or at the incision site from previous abdominal surgery (incisional hernia). You may have this procedure as emergency surgery if part of your intestine gets trapped inside the hernia and starts to lose its blood supply (strangulation). Laparoscopic surgery is done through small incisions using a thin surgical telescope with a light and camera on the end (laparoscope). During surgery, your surgeon will use images from the laparoscope to guide the procedure. A mesh screen will be placed in the hernia to close the opening and strengthen the abdominal wall. Tell a health care provider about:  Any allergies you have.  All medicines you are taking, including vitamins, herbs, eye drops, creams, and over-the-counter medicines.  Any problems you or family members have had with anesthetic medicines.  Any blood disorders you have.  Any surgeries you have had.  Any medical conditions you have.  Whether you are pregnant or may be pregnant. What are the risks? Generally, this is a safe procedure. However, problems may occur, including:  Infection.  Bleeding.  Allergic reactions to medicines.  Damage to other structures or organs in the abdomen.  Trouble urinating or having a bowel movement after surgery.  Pneumonia.  Blood clots.  The hernia coming back after surgery.  Fluid buildup in the area of the hernia. In some cases, your health care provider may need to switch from a laparoscopic procedure to a procedure that is done through a single, larger incision in the abdomen (open procedure). You may need an open procedure if:  You have a hernia that is difficult to repair.  Your organs are hard to see.  You have  bleeding problems during the laparoscopic procedure. What happens before the procedure? Medicines  Ask your health care provider about: ? Changing or stopping your regular medicines. This is especially important if you are taking diabetes medicines or blood thinners. ? Taking medicines such as aspirin and ibuprofen. These medicines can thin your blood. Do not take these medicines before your procedure if your health care provider instructs you not to.  You may be given antibiotic medicine to help prevent infection. General instructions   You may be asked to take a laxative or do an enema to empty your bowel before surgery (bowel prep).  Do not use any products that contain nicotine or tobacco, such as cigarettes and e-cigarettes. If you need help quitting, ask your health care provider.  You may need to have tests before the procedure, such as: ? Blood tests. ? Urine tests. ? Abdominal ultrasound. ? Chest X-ray. ? Electrocardiogram (ECG).  Plan to have someone take you home from the hospital or clinic.  If you will be going home right after the procedure, plan to have someone with you for 24 hours. What happens during the procedure?  To reduce your risk of infection: ? Your health care team will wash or sanitize their hands. ? Your skin will be washed with soap.  An IV tube will be inserted into one of your veins.  You will be given one or more of the following: ? A medicine to help you relax (sedative). ? A medicine to make you fall asleep (general anesthetic).  A small incision will be made in your abdomen. A hollow metal tube (  trocar) will be placed through the incision.  A tube will be placed through the trocar to inflate your abdomen with air-like gas. This makes it easier for your surgeon to see inside your abdomen and do the repair.  The laparoscope will be inserted into your abdomen through the trocar. The laparoscope will send images to a monitor in the operating  room.  Other trocars will be put through other small incisions in your abdomen. The surgical instruments needed for the procedure will be placed through these trocars.  The tissue or intestines that make up the hernia will be moved back into place.  The edges of the hernia may be stitched together.  A piece of mesh will be used to close the hernia. Stitches (sutures), clips, or staples will be used to keep the mesh in place.  A bandage (dressing) or skin glue will be put over the incisions. The procedure may vary among health care providers and hospitals. What happens after the procedure?  Your blood pressure, heart rate, breathing rate, and blood oxygen level will be monitored until the medicines you were given have worn off.  You will continue to receive fluids and medicines through an IV tube. Your IV tube will be removed when you can drink clear fluids.  You will be given pain medicine as needed.  You will be encouraged to get up and walk around as soon as possible.  You may have to wear compression stockings. These stockings help to prevent blood clots and reduce swelling in your legs.  You will be shown how to do deep breathing exercises to help prevent a lung infection.  Do not drive for 24 hours if you were given a sedative. This information is not intended to replace advice given to you by your health care provider. Make sure you discuss any questions you have with your health care provider. Document Released: 10/11/2012 Document Revised: 10/07/2017 Document Reviewed: 06/11/2016 Elsevier Patient Education  2020 [employer]  

## 2019-06-01 ENCOUNTER — Encounter (HOSPITAL_COMMUNITY): Payer: Self-pay

## 2019-06-01 ENCOUNTER — Other Ambulatory Visit (HOSPITAL_COMMUNITY)
Admission: RE | Admit: 2019-06-01 | Discharge: 2019-06-01 | Disposition: A | Discharge status: Home / Self Care | Payer: PRIVATE HEALTH INSURANCE | Source: Ambulatory Visit | Attending: General Surgery | Admitting: General Surgery

## 2019-06-01 ENCOUNTER — Other Ambulatory Visit: Payer: Self-pay

## 2019-06-01 ENCOUNTER — Encounter (HOSPITAL_COMMUNITY)
Admission: RE | Admit: 2019-06-01 | Discharge: 2019-06-01 | Disposition: A | Discharge status: Home / Self Care | Payer: PRIVATE HEALTH INSURANCE | Source: Ambulatory Visit | Attending: General Surgery | Admitting: General Surgery

## 2019-06-01 DIAGNOSIS — Z1159 Encounter for screening for other viral diseases: Secondary | ICD-10-CM | POA: Insufficient documentation

## 2019-06-01 HISTORY — DX: Hypothyroidism, unspecified: E03.9

## 2019-06-02 LAB — SARS CORONAVIRUS 2 (TAT 6-24 HRS): SARS Coronavirus 2: NEGATIVE

## 2019-06-04 ENCOUNTER — Ambulatory Visit (HOSPITAL_COMMUNITY)
Admission: RE | Admit: 2019-06-04 | Discharge: 2019-06-04 | Disposition: A | Discharge status: Home / Self Care | Payer: PRIVATE HEALTH INSURANCE | Attending: General Surgery | Admitting: General Surgery

## 2019-06-04 ENCOUNTER — Ambulatory Visit (HOSPITAL_COMMUNITY): Payer: PRIVATE HEALTH INSURANCE | Admitting: Anesthesiology

## 2019-06-04 ENCOUNTER — Other Ambulatory Visit: Payer: Self-pay

## 2019-06-04 ENCOUNTER — Encounter (HOSPITAL_COMMUNITY)
Admission: RE | Disposition: A | Discharge status: Home / Self Care | Payer: Self-pay | Source: Home / Self Care | Attending: General Surgery

## 2019-06-04 ENCOUNTER — Encounter (HOSPITAL_COMMUNITY): Payer: Self-pay | Admitting: *Deleted

## 2019-06-04 DIAGNOSIS — Z79890 Hormone replacement therapy (postmenopausal): Secondary | ICD-10-CM | POA: Diagnosis not present

## 2019-06-04 DIAGNOSIS — F17210 Nicotine dependence, cigarettes, uncomplicated: Secondary | ICD-10-CM | POA: Insufficient documentation

## 2019-06-04 DIAGNOSIS — K660 Peritoneal adhesions (postprocedural) (postinfection): Secondary | ICD-10-CM | POA: Insufficient documentation

## 2019-06-04 DIAGNOSIS — E039 Hypothyroidism, unspecified: Secondary | ICD-10-CM | POA: Insufficient documentation

## 2019-06-04 DIAGNOSIS — K432 Incisional hernia without obstruction or gangrene: Secondary | ICD-10-CM

## 2019-06-04 HISTORY — PX: INCISIONAL HERNIA REPAIR: SHX193

## 2019-06-04 LAB — PREGNANCY, URINE: Preg Test, Ur: NEGATIVE

## 2019-06-04 SURGERY — REPAIR, HERNIA, INCISIONAL, LAPAROSCOPIC
Anesthesia: General

## 2019-06-04 MED ORDER — ONDANSETRON HCL 4 MG/2ML IJ SOLN
INTRAMUSCULAR | Status: DC | PRN
  Administered 2019-06-04: 4 mg via INTRAVENOUS

## 2019-06-04 MED ORDER — LACTATED RINGERS IV SOLN: INTRAVENOUS | Status: DC

## 2019-06-04 MED ORDER — LIDOCAINE HCL (CARDIAC) PF 100 MG/5ML IV SOSY
PREFILLED_SYRINGE | INTRAVENOUS | Status: DC | PRN
  Administered 2019-06-04: 100 mg via INTRAVENOUS

## 2019-06-04 MED ORDER — LIDOCAINE 2% (20 MG/ML) 5 ML SYRINGE
INTRAMUSCULAR | Status: AC
  Filled 2019-06-04: qty 15

## 2019-06-04 MED ORDER — 0.9 % SODIUM CHLORIDE (POUR BTL) OPTIME
TOPICAL | Status: DC | PRN
  Administered 2019-06-04: 1000 mL

## 2019-06-04 MED ORDER — POVIDONE-IODINE 10 % EX OINT
TOPICAL_OINTMENT | CUTANEOUS | Status: AC
  Filled 2019-06-04: qty 1

## 2019-06-04 MED ORDER — DEXAMETHASONE SODIUM PHOSPHATE 4 MG/ML IJ SOLN
INTRAMUSCULAR | Status: DC | PRN
  Administered 2019-06-04: 10 mg via INTRAVENOUS

## 2019-06-04 MED ORDER — KETOROLAC TROMETHAMINE 30 MG/ML IJ SOLN
30.0000 mg | Freq: Once | INTRAMUSCULAR | Status: AC
  Administered 2019-06-04: 13:00:00 30 mg via INTRAVENOUS
  Filled 2019-06-04: qty 1

## 2019-06-04 MED ORDER — MIDAZOLAM HCL 2 MG/2ML IJ SOLN: 0.5000 mg | Freq: Once | INTRAMUSCULAR | Status: DC | PRN

## 2019-06-04 MED ORDER — OXYCODONE-ACETAMINOPHEN 7.5-325 MG PO TABS: 1.0000 | ORAL_TABLET | Freq: Four times a day (QID) | ORAL | 0 refills | Status: DC | PRN

## 2019-06-04 MED ORDER — VANCOMYCIN HCL IN DEXTROSE 1-5 GM/200ML-% IV SOLN
1000.0000 mg | INTRAVENOUS | Status: AC
  Administered 2019-06-04: 1000 mg via INTRAVENOUS
  Filled 2019-06-04: qty 200

## 2019-06-04 MED ORDER — PROPOFOL 10 MG/ML IV BOLUS
INTRAVENOUS | Status: DC | PRN
  Administered 2019-06-04: 200 mg via INTRAVENOUS

## 2019-06-04 MED ORDER — ROCURONIUM BROMIDE 10 MG/ML (PF) SYRINGE
PREFILLED_SYRINGE | INTRAVENOUS | Status: AC
  Filled 2019-06-04: qty 40

## 2019-06-04 MED ORDER — BUPIVACAINE LIPOSOME 1.3 % IJ SUSP
INTRAMUSCULAR | Status: DC | PRN
  Administered 2019-06-04: 20 mL

## 2019-06-04 MED ORDER — HYDROMORPHONE HCL 1 MG/ML IJ SOLN
0.2500 mg | INTRAMUSCULAR | Status: DC | PRN
  Administered 2019-06-04 (×2): 0.5 mg via INTRAVENOUS
  Filled 2019-06-04 (×2): qty 0.5

## 2019-06-04 MED ORDER — LACTATED RINGERS IV SOLN
INTRAVENOUS | Status: DC | PRN
  Administered 2019-06-04 (×2): via INTRAVENOUS

## 2019-06-04 MED ORDER — BUPIVACAINE LIPOSOME 1.3 % IJ SUSP
INTRAMUSCULAR | Status: AC
  Filled 2019-06-04: qty 20

## 2019-06-04 MED ORDER — GLYCOPYRROLATE 0.2 MG/ML IJ SOLN
INTRAMUSCULAR | Status: DC | PRN
  Administered 2019-06-04: 0.1 mg via INTRAVENOUS

## 2019-06-04 MED ORDER — FENTANYL CITRATE (PF) 100 MCG/2ML IJ SOLN
INTRAMUSCULAR | Status: DC | PRN
  Administered 2019-06-04: 50 ug via INTRAVENOUS
  Administered 2019-06-04 (×2): 25 ug via INTRAVENOUS
  Administered 2019-06-04: 50 ug via INTRAVENOUS
  Administered 2019-06-04 (×2): 25 ug via INTRAVENOUS
  Administered 2019-06-04: 50 ug via INTRAVENOUS
  Administered 2019-06-04 (×2): 25 ug via INTRAVENOUS

## 2019-06-04 MED ORDER — MIDAZOLAM HCL 2 MG/2ML IJ SOLN
INTRAMUSCULAR | Status: DC | PRN
  Administered 2019-06-04: 2 mg via INTRAVENOUS

## 2019-06-04 MED ORDER — CHLORHEXIDINE GLUCONATE CLOTH 2 % EX PADS: 6.0000 | MEDICATED_PAD | Freq: Once | CUTANEOUS | Status: DC

## 2019-06-04 MED ORDER — CHLORHEXIDINE GLUCONATE CLOTH 2 % EX PADS
6.0000 | MEDICATED_PAD | Freq: Once | CUTANEOUS | Status: AC
  Administered 2019-06-04: 10:00:00 6 via TOPICAL

## 2019-06-04 MED ORDER — ROCURONIUM BROMIDE 100 MG/10ML IV SOLN
INTRAVENOUS | Status: DC | PRN
  Administered 2019-06-04: 10 mg via INTRAVENOUS
  Administered 2019-06-04: 50 mg via INTRAVENOUS

## 2019-06-04 MED ORDER — SUCCINYLCHOLINE CHLORIDE 200 MG/10ML IV SOSY
PREFILLED_SYRINGE | INTRAVENOUS | Status: AC
  Filled 2019-06-04: qty 30

## 2019-06-04 MED ORDER — SUGAMMADEX SODIUM 200 MG/2ML IV SOLN
INTRAVENOUS | Status: DC | PRN
  Administered 2019-06-04: 200 mg via INTRAVENOUS

## 2019-06-04 MED ORDER — PROMETHAZINE HCL 25 MG/ML IJ SOLN: 6.2500 mg | INTRAMUSCULAR | Status: DC | PRN

## 2019-06-04 MED ORDER — HYDROCODONE-ACETAMINOPHEN 7.5-325 MG PO TABS
1.0000 | ORAL_TABLET | Freq: Once | ORAL | Status: AC | PRN
  Administered 2019-06-04: 1 via ORAL
  Filled 2019-06-04: qty 1

## 2019-06-04 SURGICAL SUPPLY — 38 items
ADH SKN CLS APL DERMABOND .7 (GAUZE/BANDAGES/DRESSINGS) ×1
APL PRP STRL LF DISP 70% ISPRP (MISCELLANEOUS) ×1
BLADE SURG SZ11 CARB STEEL (BLADE) ×3 IMPLANT
CHLORAPREP W/TINT 26 (MISCELLANEOUS) ×3 IMPLANT
CLOTH BEACON ORANGE TIMEOUT ST (SAFETY) ×3 IMPLANT
COVER LIGHT HANDLE STERIS (MISCELLANEOUS) ×6 IMPLANT
COVER WAND RF STERILE (DRAPES) ×2 IMPLANT
DERMABOND ADVANCED (GAUZE/BANDAGES/DRESSINGS) ×2
DERMABOND ADVANCED .7 DNX12 (GAUZE/BANDAGES/DRESSINGS) IMPLANT
ELECT REM PT RETURN 9FT ADLT (ELECTROSURGICAL) ×3
ELECTRODE REM PT RTRN 9FT ADLT (ELECTROSURGICAL) ×1 IMPLANT
EVACUATOR SMOKE 8.L (FILTER) ×2 IMPLANT
GLOVE BIOGEL PI IND STRL 7.0 (GLOVE) ×2 IMPLANT
GLOVE BIOGEL PI INDICATOR 7.0 (GLOVE) ×4
GLOVE SURG SS PI 7.5 STRL IVOR (GLOVE) ×3 IMPLANT
GOWN STRL REUS W/TWL LRG LVL3 (GOWN DISPOSABLE) ×9 IMPLANT
INST SET LAPROSCOPIC AP (KITS) ×3 IMPLANT
KIT TURNOVER KIT A (KITS) ×3 IMPLANT
LIGASURE LAP ATLAS 10MM 37CM (INSTRUMENTS) ×3 IMPLANT
MANIFOLD NEPTUNE II (INSTRUMENTS) ×3 IMPLANT
MESH VENTRALIGHT ST 4X6IN (Mesh General) ×2 IMPLANT
NDL INSUFFLATION 14GA 120MM (NEEDLE) ×1 IMPLANT
NEEDLE INSUFFLATION 14GA 120MM (NEEDLE) ×3 IMPLANT
NS IRRIG 1000ML POUR BTL (IV SOLUTION) ×3 IMPLANT
PACK LAP CHOLE LZT030E (CUSTOM PROCEDURE TRAY) ×3 IMPLANT
PAD ARMBOARD 7.5X6 YLW CONV (MISCELLANEOUS) ×3 IMPLANT
PENCIL HANDSWITCHING (ELECTRODE) ×2 IMPLANT
SET BASIN LINEN APH (SET/KITS/TRAYS/PACK) ×3 IMPLANT
SHEARS HARMONIC ACE PLUS 36CM (ENDOMECHANICALS) ×2 IMPLANT
SUT MNCRL AB 4-0 PS2 18 (SUTURE) ×2 IMPLANT
SUT PROLENE 0 CT 1 CR/8 (SUTURE) ×3 IMPLANT
TACKER 5MM HERNIA 3.5CML NAB (ENDOMECHANICALS) ×2 IMPLANT
TRAY FOLEY SLVR 16FR LF STAT (SET/KITS/TRAYS/PACK) ×2 IMPLANT
TROCAR ENDO BLADELESS 11MM (ENDOMECHANICALS) ×3 IMPLANT
TROCAR XCEL NON-BLD 5MMX100MML (ENDOMECHANICALS) ×2 IMPLANT
TROCAR XCEL UNIV SLVE 11M 100M (ENDOMECHANICALS) ×9 IMPLANT
TUBING INSUFFLATION (TUBING) ×3 IMPLANT
WARMER LAPAROSCOPE (MISCELLANEOUS) ×3 IMPLANT

## 2019-06-04 NOTE — Discharge Instructions (Signed)
Laparoscopic Ventral Hernia Repair, Care After °This sheet gives you information about how to care for yourself after your procedure. Your health care provider may also give you more specific instructions. If you have problems or questions, contact your health care provider. °What can I expect after the procedure? °After the procedure, it is common to have: °· Pain, discomfort, or soreness. °Follow these instructions at home: °Incision care ° °· Follow instructions from your health care provider about how to take care of your incision. Make sure you: °? Wash your hands with soap and water before you change your bandage (dressing) or before you touch your abdomen. If soap and water are not available, use hand sanitizer. °? Change your dressing as told by your health care provider. °? Leave stitches (sutures), skin glue, or adhesive strips in place. These skin closures may need to stay in place for 2 weeks or longer. If adhesive strip edges start to loosen and curl up, you may trim the loose edges. Do not remove adhesive strips completely unless your health care provider tells you to do that. °· Check your incision area every day for signs of infection. Check for: °? Redness, swelling, or pain. °? Fluid or blood. °? Warmth. °? Pus or a bad smell. °Bathing ° °· Do not take baths, swim, or use a hot tub until your health care provider approves. Ask your health care provider if you can take showers. You may only be allowed to take sponge baths for bathing. °· Keep your bandage (dressing) dry until your health care provider says it can be removed. °Activity °· Do not lift anything that is heavier than 10 lb (4.5 kg) until your health care provider approves. °· Do not drive or use heavy machinery while taking prescription pain medicine. Ask your health care provider when it is safe for you to drive or use heavy machinery. °· Do not drive for 24 hours if you were given a medicine to help you relax (sedative) during your  procedure. °· Rest as told by your health care provider. You may return to your normal activities when your health care provider approves. °General instructions °· Take over-the-counter and prescription medicines only as told by your health care provider. °· To prevent or treat constipation while you are taking prescription pain medicine, your health care provider may recommend that you: °? Take over-the-counter or prescription medicines. °? Eat foods that are high in fiber, such as fresh fruits and vegetables, whole grains, and beans. °? Limit foods that are high in fat and processed sugars, such as fried and sweet foods. °· Drink enough fluid to keep your urine clear or pale yellow. °· Hold a pillow over your abdomen when you cough or sneeze. This helps with pain. °· Keep all follow-up visits as told by your health care provider. This is important. °Contact a health care provider if: °· You have: °? A fever or chills. °? Redness, swelling, or pain around your incision. °? Fluid or blood coming from your incision. °? Pus or a bad smell coming from your incision. °? Pain that gets worse or does not get better with medicine. °? Nausea or vomiting. °? A cough. °? Shortness of breath. °· Your incision feels warm to the touch. °· You have not had a bowel movement in three days. °· You are not able to urinate. °Get help right away if: °· You have severe pain in your abdomen. °· You have persistent nausea and vomiting. °· You have   redness, warmth, or pain in your leg. °· You have chest pain. °· You have trouble breathing. °Summary °· After this procedure, it is common to have pain, discomfort, or soreness. °· Follow instructions from your health care provider about how to take care of your incision. °· Check your incision area every day for signs of infection. Report any signs of infection to your health care provider. °· Keep all follow-up visits as told by your health care provider. This is important. °This information  is not intended to replace advice given to you by your health care provider. Make sure you discuss any questions you have with your health care provider. °Document Released: 10/11/2012 Document Revised: 10/07/2017 Document Reviewed: 06/16/2016 °Elsevier Patient Education © 2020 [employer] Inc[employer] ° ° ° ° °General Anesthesia, Adult, Care After °This sheet gives you information about how to care for yourself after your procedure. Your health care provider may also give you more specific instructions. If you have problems or questions, contact your health care provider. °What can I expect after the procedure? °After the procedure, the following side effects are common: °· Pain or discomfort at the IV site. °· Nausea. °· Vomiting. °· Sore throat. °· Trouble concentrating. °· Feeling cold or chills. °· Weak or tired. °· Sleepiness and fatigue. °· Soreness and body aches. These side effects can affect parts of the body that were not involved in surgery. °Follow these instructions at home: ° °For at least 24 hours after the procedure: °· Have a responsible adult stay with you. It is important to have someone help care for you until you are awake and alert. °· Rest as needed. °· Do not: °? Participate in activities in which you could fall or become injured. °? Drive. °? Use heavy machinery. °? Drink alcohol. °? Take sleeping pills or medicines that cause drowsiness. °? Make important decisions or sign legal documents. °? Take care of children on your own. °Eating and drinking °· Follow any instructions from your health care provider about eating or drinking restrictions. °· When you feel hungry, start by eating small amounts of foods that are soft and easy to digest (bland), such as toast. Gradually return to your regular diet. °· Drink enough fluid to keep your urine pale yellow. °· If you vomit, rehydrate by drinking water, juice, or clear broth. °General instructions °· If you have sleep apnea, surgery and certain medicines  can increase your risk for breathing problems. Follow instructions from your health care provider about wearing your sleep device: °? Anytime you are sleeping, including during daytime naps. °? While taking prescription pain medicines, sleeping medicines, or medicines that make you drowsy. °· Return to your normal activities as told by your health care provider. Ask your health care provider what activities are safe for you. °· Take over-the-counter and prescription medicines only as told by your health care provider. °· If you smoke, do not smoke without supervision. °· Keep all follow-up visits as told by your health care provider. This is important. °Contact a health care provider if: °· You have nausea or vomiting that does not get better with medicine. °· You cannot eat or drink without vomiting. °· You have pain that does not get better with medicine. °· You are unable to pass urine. °· You develop a skin rash. °· You have a fever. °· You have redness around your IV site that gets worse. °Get help right away if: °· You have difficulty breathing. °· You have chest pain. °·   in your urine or stool, or you vomit blood. Summary  After the procedure, it is common to have a sore throat or nausea. It is also common to feel tired.  Have a responsible adult stay with you for the first 24 hours after general anesthesia. It is important to have someone help care for you until you are awake and alert.  When you feel hungry, start by eating small amounts of foods that are soft and easy to digest (bland), such as toast. Gradually return to your regular diet.  Drink enough fluid to keep your urine pale yellow.  Return to your normal activities as told by your health care provider. Ask your health care provider what activities are safe for you. This information is not intended to replace advice given to you by your health care provider. Make sure you discuss any questions you have with your health care  provider. Document Released: 01/31/2001 Document Revised: 10/28/2017 Document Reviewed: 06/10/2017 Elsevier Patient Education  2020 [employer].

## 2019-06-04 NOTE — Op Note (Signed)
Patient:  Katherine Villegas  DOB:  19-Dec-1990  MRN:  [id number]   Preop Diagnosis: Incisional hernia  Postop Diagnosis: Same  Procedure: Laparoscopic incisional herniorrhaphy with mesh  Surgeon: Aviva Signs, MD  Anes: General endotracheal  Indications: Patient is a 28 year old white female who presents with an enlarging tender periumbilical hernia from a previous surgical incision site.  The risks and benefits of the procedure including bleeding, infection, mesh use, and the possibility of recurrence of the hernia were fully explained to the patient, who gave informed consent.  Procedure note: The patient was placed in the supine position.  After induction of general endotracheal anesthesia, the abdomen was prepped and draped using the usual sterile technique with ChloraPrep.  Surgical site confirmation was performed.  Incision was made in the epigastric region.  A Veress needle was introduced into the abdominal cavity and confirmation placement was done using the saline drop test.  The abdomen was then insufflated to 15 mmHg pressure.  An 11 mm trocar was placed in the left upper quadrant region under direct visualization without difficulty.  An additional 11 mm trocar was placed in the right upper quadrant region and a 5 mm trocar was placed in left lower quadrant region.  The ventral wall was inspected and there was omentum adhesed into her hernia at the umbilicus.  The adhesions were freed away using the harmonic scalpel.  The resultant defect measured approximately 3 cm in its greatest diameter.  A 10 x 15 cm ventral light DualMesh was then inserted and tacked at 4 quadrants using 0 Prolene interrupted sutures.  A pro-tacker was then used circumferentially in a 2 layer fashion to secure the mesh to the anterior abdominal wall.  Greater than 2 cm overlap was noted on all edges of the hernia defect.  No abnormal bleeding was noted.  All air were then evacuated from the abdominal cavity prior to  removal of the trochars.  All wounds were irrigated with normal saline.  All wounds were injected with Exparel.  The trocar site incisions were closed using a 4-0 Monocryl subcuticular suture.  All incisions were closed using Dermabond.  All tape and needle counts were correct at the end of the procedure.  The patient was extubated in the operating room and transferred to PACU in stable condition.  Complications: None  EBL: Minimal  Specimen: None

## 2019-06-04 NOTE — Anesthesia Preprocedure Evaluation (Signed)
Anesthesia Evaluation  Patient identified by MRN, date of birth, ID band Patient awake    Reviewed: Allergy & Precautions, NPO status , Patient's Chart, lab work & pertinent test results  Airway Mallampati: II  TM Distance: >3 FB Neck ROM: Full    Dental no notable dental hx. (+) Teeth Intact   Pulmonary neg pulmonary ROS, Current Smoker,    Pulmonary exam normal breath sounds clear to auscultation       Cardiovascular Exercise Tolerance: Good negative cardio ROS Normal cardiovascular examI Rhythm:Regular Rate:Normal     Neuro/Psych  Headaches, negative psych ROS   GI/Hepatic negative GI ROS, Neg liver ROS,   Endo/Other  Hypothyroidism   Renal/GU negative Renal ROS  negative genitourinary   Musculoskeletal negative musculoskeletal ROS (+)   Abdominal   Peds negative pediatric ROS (+)  Hematology negative hematology ROS (+)   Anesthesia Other Findings   Reproductive/Obstetrics negative OB ROS                             Anesthesia Physical Anesthesia Plan  ASA: II  Anesthesia Plan: General   Post-op Pain Management:    Induction: Intravenous  PONV Risk Score and Plan: 2 and Ondansetron, Dexamethasone and Treatment may vary due to age or medical condition  Airway Management Planned: Oral ETT  Additional Equipment:   Intra-op Plan:   Post-operative Plan: Extubation in OR  Informed Consent: I have reviewed the patients History and Physical, chart, labs and discussed the procedure including the risks, benefits and alternatives for the proposed anesthesia with the patient or authorized representative who has indicated his/her understanding and acceptance.     Dental advisory given  Plan Discussed with: CRNA  Anesthesia Plan Comments: (Plan Full PPE use  Plan GETA -WTP with same after Q&A)        Anesthesia Quick Evaluation

## 2019-06-04 NOTE — Interval H&P Note (Signed)
History and Physical Interval Note:  06/04/2019 10:34 AM  Katherine Villegas  has presented today for surgery, with the diagnosis of incisional hernia.  The various methods of treatment have been discussed with the patient and family. After consideration of risks, benefits and other options for treatment, the patient has consented to  Procedure(s): [place] (N/A) as a surgical intervention.  The patient's history has been reviewed, patient examined, no change in status, stable for surgery.  I have reviewed the patient's chart and labs.  Questions were answered to the patient's satisfaction.     Aviva Signs

## 2019-06-04 NOTE — Interval H&P Note (Signed)
History and Physical Interval Note:  06/04/2019 10:35 AM  Katherine Villegas  has presented today for surgery, with the diagnosis of incisional hernia.  The various methods of treatment have been discussed with the patient and family. After consideration of risks, benefits and other options for treatment, the patient has consented to  Procedure(s): [place] (N/A) as a surgical intervention.  The patient's history has been reviewed, patient examined, no change in status, stable for surgery.  I have reviewed the patient's chart and labs.  Questions were answered to the patient's satisfaction.     Aviva Signs

## 2019-06-04 NOTE — Anesthesia Procedure Notes (Signed)
Procedure Name: Intubation Date/Time: 06/04/2019 11:20 AM Performed by: Georgeanne Nim, CRNA Pre-anesthesia Checklist: Patient identified, Emergency Drugs available, Suction available, Patient being monitored and Timeout performed Patient Re-evaluated:Patient Re-evaluated prior to induction Oxygen Delivery Method: Circle system utilized Preoxygenation: Pre-oxygenation with 100% oxygen Induction Type: IV induction Ventilation: Mask ventilation without difficulty Laryngoscope Size: Mac and 4 Grade View: Grade I Tube type: Oral Tube size: 7.0 mm Number of attempts: 1 Airway Equipment and Method: Stylet Placement Confirmation: ETT inserted through vocal cords under direct vision,  positive ETCO2,  CO2 detector and breath sounds checked- equal and bilateral Secured at: 21 cm Tube secured with: Tape Dental Injury: Teeth and Oropharynx as per pre-operative assessment

## 2019-06-04 NOTE — Anesthesia Postprocedure Evaluation (Signed)
Anesthesia Post Note  Patient: Katherine Villegas  Procedure(s) Performed: LAPAROSCOPIC INCISIONAL HERNIA WITH MESH (N/A )  Patient location during evaluation: PACU Anesthesia Type: General Level of consciousness: awake Pain management: pain level controlled Vital Signs Assessment: post-procedure vital signs reviewed and stable Respiratory status: spontaneous breathing Cardiovascular status: stable Postop Assessment: no apparent nausea or vomiting Anesthetic complications: no     Last Vitals:  Vitals:   06/04/19 1245 06/04/19 1250  BP: 103/61   Pulse: 88 79  Resp: (!) 21 13  Temp:    SpO2: 97% 100%    Last Pain:  Vitals:   06/04/19 1255  TempSrc:   PainSc: 2                  [name]

## 2019-06-04 NOTE — Transfer of Care (Signed)
Immediate Anesthesia Transfer of Care Note  Patient: Katherine Villegas  Procedure(s) Performed: LAPAROSCOPIC INCISIONAL HERNIA WITH MESH (N/A )  Patient Location: PACU  Anesthesia Type:General  Level of Consciousness: awake, alert  and patient cooperative  Airway & Oxygen Therapy: Patient Spontanous Breathing  Post-op Assessment: Report given to RN and Post -op Vital signs reviewed and stable  Post vital signs: Reviewed and stable  Last Vitals:  Vitals Value Taken Time  BP 106/62 06/04/19 1235  Temp 36.3 C 06/04/19 1233  Pulse 96 06/04/19 1236  Resp 18 06/04/19 1236  SpO2 94 % 06/04/19 1236  Vitals shown include unvalidated device data.  Last Pain:  Vitals:   06/04/19 0936  TempSrc: Oral  PainSc: 0-No pain         Complications: No apparent anesthesia complications

## 2019-06-05 ENCOUNTER — Encounter (HOSPITAL_COMMUNITY): Payer: Self-pay | Admitting: General Surgery

## 2019-06-13 ENCOUNTER — Encounter: Payer: Self-pay | Admitting: General Surgery

## 2019-06-20 ENCOUNTER — Telehealth: Payer: Self-pay | Admitting: General Surgery

## 2019-06-20 ENCOUNTER — Other Ambulatory Visit: Payer: Self-pay

## 2019-06-21 ENCOUNTER — Telehealth (INDEPENDENT_AMBULATORY_CARE_PROVIDER_SITE_OTHER): Payer: Self-pay | Admitting: General Surgery

## 2019-06-21 DIAGNOSIS — Z09 Encounter for follow-up examination after completed treatment for conditions other than malignant neoplasm: Secondary | ICD-10-CM

## 2019-06-21 NOTE — Telephone Encounter (Signed)
Virtual phone postoperative visit performed.  Patient states she is doing well.  Her incisions have healed.  She has resumed normal activity.  I told her to call me should any problems arise.

## 2019-07-31 ENCOUNTER — Ambulatory Visit (INDEPENDENT_AMBULATORY_CARE_PROVIDER_SITE_OTHER): Payer: PRIVATE HEALTH INSURANCE | Admitting: Internal Medicine

## 2019-07-31 ENCOUNTER — Other Ambulatory Visit: Payer: Self-pay

## 2019-07-31 ENCOUNTER — Encounter (INDEPENDENT_AMBULATORY_CARE_PROVIDER_SITE_OTHER): Payer: Self-pay | Admitting: Internal Medicine

## 2019-07-31 VITALS — BP 100/60 | HR 60 | Ht 63.0 in | Wt 191.4 lb

## 2019-07-31 DIAGNOSIS — G43119 Migraine with aura, intractable, without status migrainosus: Secondary | ICD-10-CM

## 2019-07-31 DIAGNOSIS — N943 Premenstrual tension syndrome: Secondary | ICD-10-CM

## 2019-07-31 DIAGNOSIS — E559 Vitamin D deficiency, unspecified: Secondary | ICD-10-CM

## 2019-07-31 DIAGNOSIS — E782 Mixed hyperlipidemia: Secondary | ICD-10-CM | POA: Diagnosis not present

## 2019-07-31 HISTORY — DX: Premenstrual tension syndrome: N94.3

## 2019-07-31 HISTORY — DX: Vitamin D deficiency, unspecified: E55.9

## 2019-07-31 NOTE — Progress Notes (Signed)
Wellness Office Visit  Subjective:  Patient ID: Katherine Villegas, female    DOB: 22-Sep-1991  Age: 28 y.o. MRN: [id number]  CC: This lady comes in for follow-up regarding her , premenstrual syndrome, vitamin D deficiency. HPI  She is doing reasonably well and has lost further weight.  She tolerated higher dose of desiccated NP thyroid from the last visit. She does have difficulty with insomnia.  Progesterone does seem to help her sleep and certainly has helped her premenstrual symptoms. Past Medical History:  Diagnosis Date  . Headache   . Hypothyroidism   . PMS (premenstrual syndrome) 07/31/2019  . Vitamin D deficiency disease 07/31/2019      Family History  Problem Relation Age of Onset  . Migraines Mother   . Hypertension Mother   . Hyperlipidemia Mother   . Alcohol abuse Mother   . Diabetes Maternal Grandfather   . Dementia Maternal Grandfather   . Alcohol abuse Maternal Grandfather   . Alcohol abuse Father   . Arthritis Maternal Grandmother   . COPD Paternal Grandmother   . Migraines Maternal Aunt   . Alcohol abuse Paternal Aunt   . Arthritis Paternal Aunt   . Arthritis Paternal Uncle     Social History   Social History Narrative   Married since 2015.Lives with husband and 3 kids.Homemaker.     Current Meds  Medication Sig  . Cholecalciferol (VITAMIN D3) 250 MCG (10000 UT) TABS Take 10,000 Units by mouth daily.  . NP THYROID 60 MG tablet Take 60 mg by mouth daily before breakfast.  . progesterone (PROMETRIUM) 200 MG capsule Take 200 mg by mouth at bedtime.   . topiramate (TOPAMAX) 200 MG tablet Take 200 mg by mouth daily.     Nutrition  She does do intermittent fasting on a daily basis, usually at least 16 hours. Sleep  She does have insomnia.  Exercise  No formal exercise. Bio Identical Hormones  Micronized progesterone is being used in this patient for multiple benefits based on studies including protection against uterine cancer, breast  cancer, osteoporosis and heart disease. The patient has been counseled regarding side effects, benefits and modes of administration. The patient is agreeable that this therapy is an integral part of her wellness, quality of life and prevention of chronic disease.  This patient is being treated with desiccated thyroid, off label, for symptoms of thyroid deficiency.  The patient has been counseled regarding side effects and how to deal with them.  Objective:   Today's Vitals: BP 100/60   Pulse 60   Ht 5\' 3"  (1.6 m)   Wt 191 lb 6.4 oz (86.8 kg)   BMI 33.90 kg/m  Vitals with BMI 07/31/2019 06/04/2019 06/04/2019  Height 5\' 3"  - -  Weight 191 lbs 6 oz - -  BMI 33.91 - -  Systolic 100 104 99  Diastolic 60 73 64  Pulse 60 88 87     Physical Exam       Assessment   1. Intractable migraine with aura without status migrainosus   2. PMS (premenstrual syndrome)   3. Vitamin D deficiency disease   4. Mixed hyperlipidemia       Tests ordered Orders Placed This Encounter  Procedures  . Lipid panel  . T3, free  . TSH  . VITAMIN D 25 Hydroxy (Vit-D Deficiency, Fractures)     Plan: 1. She will continue with all medications for chronic symptoms and conditions above. 2. We discussed the possibility of increasing progesterone to  400 mg at night to see if this will help her with her insomnia or she could try melatonin from [facility] [employer]. 3. Further recommendations will depend on blood results and I will see her in about 3 months time for follow-up.     Doree Albee, MD

## 2019-07-31 NOTE — Patient Instructions (Signed)
[facility] Dietary Recommendations for Weight Loss What to Avoid . Avoid added sugars o Often added sugar can be found in processed foods such as many condiments, dry cereals, cakes, cookies, chips, crisps, crackers, candies, sweetened drinks, etc.  o Read labels and AVOID/DECREASE use of foods with the following in their ingredient list: Sugar, fructose, high fructose corn syrup, sucrose, glucose, maltose, dextrose, molasses, cane sugar, brown sugar, any type of syrup, agave nectar, etc.   . Avoid snacking in between meals . Avoid foods made with flour o If you are going to eat food made with flour, choose those made with whole-grains; and, minimize your consumption as much as is tolerable . Avoid processed foods o These foods are generally stocked in the middle of the grocery store. Focus on shopping on the perimeter of the grocery.  What to Include . Vegetables o GREEN LEAFY VEGETABLES: Kale, spinach, mustard greens, collard greens, cabbage, broccoli, etc. o OTHER: Asparagus, cauliflower, eggplant, carrots, peas, Brussel sprouts, tomatoes, bell peppers, zucchini, beets, cucumbers, etc. . Grains, seeds, and legumes o Beans: kidney beans, black eyed peas, garbanzo beans, black beans, pinto beans, etc. o Whole, unrefined grains: brown rice, barley, bulgur, oatmeal, etc. . Healthy fats  o Avoid highly processed fats such as vegetable oil o Examples of healthy fats: avocado, olives, virgin olive oil, dark chocolate (?72% Cocoa), nuts (peanuts, almonds, walnuts, cashews, pecans, etc.) . Low - Moderate Intake of Animal Sources of Protein o Meat sources: chicken, turkey, salmon, tuna. Limit to 4 ounces of meat at one time. o Consider limiting dairy sources, but when choosing dairy focus on: PLAIN Greek yogurt, cottage cheese, high-protein milk . Fruit o Choose berries  When to Eat . Intermittent Fasting: o Choosing not to eat for a specific time period, but DO FOCUS ON HYDRATION  when fasting o Multiple Techniques: - Time Restricted Eating: eat 3 meals in a day, each meal lasting no more than 60 minutes, no snacks between meals - 16-18 hour fast: fast for 16 to 18 hours up to 7 days a week. Often suggested to start with 2-3 nonconsecutive days per week.  . Remember the time you sleep is counted as fasting.  . Examples of eating schedule: Fast from 7:00pm-11:00am. Eat between 11:00am-7:00pm.  - 24-hour fast: fast for 24 hours up to every other day. Often suggested to start with 1 day per week . Remember the time you sleep is counted as fasting . Examples of eating schedule:  o Eating day: eat 2-3 meals on your eating day. If doing 2 meals, each meal should last no more than 90 minutes. If doing 3 meals, each meal should last no more than 60 minutes. Finish last meal by 7:00pm. o Fasting day: Fast until 7:00pm.  o IF YOU FEEL UNWELL FOR ANY REASON/IN ANY WAY WHEN FASTING, STOP FASTING BY EATING A NUTRITIOUS SNACK OR LIGHT MEAL o ALWAYS FOCUS ON HYDRATION DURING FASTS - Acceptable Hydration sources: water, broths, tea/coffee (black tea/coffee is best but using a small amount of whole-fat dairy products in coffee/tea is acceptable).  - Poor Hydration Sources: anything with sugar or artificial sweeteners added to it  These recommendations have been developed for patients that are actively receiving medical care from either Dr. Avett Reineck or Sarah Gray, DNP, NP-C at [facility]. These recommendations are developed for patients with specific medical conditions and are not meant to be distributed or used by others that are not actively receiving care from either provider listed   above at [facility]. It is not appropriate to participate in the above eating plans without proper medical supervision.   Reference: Rexanne Mano. The obesity code. [place]; 2016.  WWW.[employer].COM  Melatonin 1mg  capsules (#60)

## 2019-08-01 ENCOUNTER — Other Ambulatory Visit (INDEPENDENT_AMBULATORY_CARE_PROVIDER_SITE_OTHER): Payer: Self-pay | Admitting: Internal Medicine

## 2019-08-01 LAB — TSH: TSH: 0.57 mIU/L

## 2019-08-01 LAB — LIPID PANEL
Cholesterol: 250 mg/dL — ABNORMAL HIGH (ref <200)
HDL: 42 mg/dL — ABNORMAL LOW (ref >=50)
LDL Cholesterol (Calc): 160 mg/dL (calc) — ABNORMAL HIGH
Non-HDL Cholesterol (Calc): 208 mg/dL (calc) — ABNORMAL HIGH (ref <130)
Total CHOL/HDL Ratio: 6 (calc) — ABNORMAL HIGH (ref <5.0)
Triglycerides: 291 mg/dL — ABNORMAL HIGH (ref <150)

## 2019-08-01 LAB — VITAMIN D 25 HYDROXY (VIT D DEFICIENCY, FRACTURES): Vit D, 25-Hydroxy: 65 ng/mL (ref 30–100)

## 2019-08-01 LAB — T3, FREE: T3, Free: 3.5 pg/mL (ref 2.3–4.2)

## 2019-08-01 MED ORDER — THYROID 90 MG PO TABS: 90.0000 mg | ORAL_TABLET | Freq: Every day | ORAL | 3 refills | Status: DC

## 2019-08-01 NOTE — Progress Notes (Signed)
Your cholesterol is elevated and your thyroid is not optimal.  These often go together so I am going to see if you can tolerate the higher dose of NP thyroid 90 mg daily.  I will send a new prescription and I would like you to start that as soon as you get it.  If you have any questions, do not hesitate to contact me through my chart.  Be well!

## 2019-08-03 ENCOUNTER — Encounter (INDEPENDENT_AMBULATORY_CARE_PROVIDER_SITE_OTHER): Payer: Self-pay | Admitting: Internal Medicine

## 2019-08-20 ENCOUNTER — Other Ambulatory Visit (INDEPENDENT_AMBULATORY_CARE_PROVIDER_SITE_OTHER): Payer: Self-pay | Admitting: Internal Medicine

## 2019-08-20 ENCOUNTER — Encounter (INDEPENDENT_AMBULATORY_CARE_PROVIDER_SITE_OTHER): Payer: Self-pay | Admitting: Internal Medicine

## 2019-08-20 MED ORDER — THYROID 90 MG PO TABS: 90.0000 mg | ORAL_TABLET | Freq: Every day | ORAL | 3 refills | Status: DC

## 2019-09-27 ENCOUNTER — Other Ambulatory Visit (INDEPENDENT_AMBULATORY_CARE_PROVIDER_SITE_OTHER): Payer: Self-pay | Admitting: Internal Medicine

## 2019-09-27 ENCOUNTER — Encounter (INDEPENDENT_AMBULATORY_CARE_PROVIDER_SITE_OTHER): Payer: Self-pay | Admitting: Internal Medicine

## 2019-10-18 ENCOUNTER — Other Ambulatory Visit (INDEPENDENT_AMBULATORY_CARE_PROVIDER_SITE_OTHER): Payer: Self-pay | Admitting: Internal Medicine

## 2019-11-19 ENCOUNTER — Other Ambulatory Visit (INDEPENDENT_AMBULATORY_CARE_PROVIDER_SITE_OTHER): Payer: Self-pay | Admitting: Internal Medicine

## 2019-11-21 ENCOUNTER — Ambulatory Visit (INDEPENDENT_AMBULATORY_CARE_PROVIDER_SITE_OTHER): Payer: PRIVATE HEALTH INSURANCE | Admitting: Internal Medicine

## 2019-12-18 ENCOUNTER — Ambulatory Visit (INDEPENDENT_AMBULATORY_CARE_PROVIDER_SITE_OTHER): Payer: PRIVATE HEALTH INSURANCE | Admitting: Internal Medicine

## 2019-12-18 ENCOUNTER — Other Ambulatory Visit: Payer: Self-pay

## 2019-12-18 ENCOUNTER — Encounter (INDEPENDENT_AMBULATORY_CARE_PROVIDER_SITE_OTHER): Payer: Self-pay | Admitting: Internal Medicine

## 2019-12-18 VITALS — BP 116/70 | HR 70 | Temp 97.3°F | Resp 18 | Ht 62.0 in | Wt 192.0 lb

## 2019-12-18 DIAGNOSIS — G43119 Migraine with aura, intractable, without status migrainosus: Secondary | ICD-10-CM

## 2019-12-18 DIAGNOSIS — R5381 Other malaise: Secondary | ICD-10-CM | POA: Diagnosis not present

## 2019-12-18 DIAGNOSIS — E782 Mixed hyperlipidemia: Secondary | ICD-10-CM

## 2019-12-18 DIAGNOSIS — R5383 Other fatigue: Secondary | ICD-10-CM

## 2019-12-18 NOTE — Progress Notes (Signed)
Metrics: Intervention Frequency ACO  Documented Smoking Status Yearly  Screened one or more times in 24 months  Cessation Counseling or  Active cessation medication Past 24 months  Past 24 months   Guideline developer: UpToDate (See UpToDate for funding source) Date Released: 2014       Wellness Office Visit  Subjective:  Patient ID: Katherine Villegas, female    DOB: [birth year]  Age: 29 y.o. MRN: [id number]  CC: This lady comes in for feelings of malaise and fatigue, migrainous headaches, vitamin D deficiency and premenstrual syndrome. HPI  She did tolerate the higher dose of desiccated NP thyroid on the last visit which was in September but she still feels that she is fatigued. The progesterone does seem to help her migrainous headaches together with the Topamax that she takes. She also continues to take vitamin D3 supplementation for vitamin D deficiency. She has been doing intermittent fasting almost on a regular basis. She has significant hyperlipidemia but has not required statin therapy and I am trying to work with her regarding this in terms of nutrition and exercise and hormone optimization with thyroid. Past Medical History:  Diagnosis Date  . Headache   . Hypothyroidism   . PMS (premenstrual syndrome) 07/31/2019  . Vitamin D deficiency disease 07/31/2019      Family History  Problem Relation Age of Onset  . Migraines Mother   . Hypertension Mother   . Hyperlipidemia Mother   . Alcohol abuse Mother   . Diabetes Maternal Grandfather   . Dementia Maternal Grandfather   . Alcohol abuse Maternal Grandfather   . Alcohol abuse Father   . Arthritis Maternal Grandmother   . COPD Paternal Grandmother   . Migraines Maternal Aunt   . Alcohol abuse Paternal Aunt   . Arthritis Paternal Aunt   . Arthritis Paternal Uncle     Social History   Social History Narrative   Married since 2015.Lives with husband and 3 kids.Homemaker.   Social History   Tobacco Use  . Smoking  status: Current Some Day Smoker    Packs/day: 0.25    Years: 15.00    Pack years: 3.75    Types: Cigarettes  . Smokeless tobacco: Never Used  Substance Use Topics  . Alcohol use: No    Alcohol/week: 0.0 standard drinks    Comment: rarely    Current Meds  Medication Sig  . Cholecalciferol (VITAMIN D3) 250 MCG (10000 UT) TABS Take 10,000 Units by mouth daily.  . NP THYROID 90 MG tablet TAKE ONE TABLET BY MOUTH EVERY DAY  . progesterone (PROMETRIUM) 200 MG capsule TAKE ONE CAPSULE BY MOUTH EVERY NIGHT.  Marland Kitchen topiramate (TOPAMAX) 200 MG tablet TAKE 1 TABLET BY MOUTH EVERY DAY      Bio Identical Hormones  This patient is being treated with desiccated thyroid, off label, for symptoms of thyroid deficiency.  The patient has been counseled regarding side effects and how to deal with them.  Objective:   Today's Vitals: BP 116/70 (BP Location: Right Arm, Patient Position: Sitting, Cuff Size: Normal)   Pulse 70   Temp (!) 97.3 F (36.3 C) (Temporal)   Resp 18   Ht 5\' 2"  (1.575 m)   Wt 192 lb (87.1 kg)   SpO2 98% Comment: wearing mask  BMI 35.12 kg/m  Vitals with BMI 12/18/2019 07/31/2019 06/04/2019  Height 5\' 2"  5\' 3"  -  Weight 192 lbs 191 lbs 6 oz -  BMI 32.44 01.02 -  Systolic 725 366 440  Diastolic  70 60 73  Pulse 70 60 88     Physical Exam   She looks systemically well.  Her weight is maintained.    Assessment   1. Mixed hyperlipidemia   2. Malaise and fatigue   3. Intractable migraine with aura without status migrainosus       Tests ordered Orders Placed This Encounter  Procedures  . T3, free  . TSH  . Lipid panel     Plan: 1. Blood work is ordered as above. 2. She will continue with NP thyroid and we may need to make further adjustments. 3. I will see her in 3 months time for follow-up.   No orders of the defined types were placed in this encounter.   Wilson Singer, MD

## 2019-12-19 ENCOUNTER — Other Ambulatory Visit (INDEPENDENT_AMBULATORY_CARE_PROVIDER_SITE_OTHER): Payer: Self-pay | Admitting: Internal Medicine

## 2019-12-19 LAB — T3, FREE: T3, Free: 4 pg/mL (ref 2.3–4.2)

## 2019-12-19 LAB — LIPID PANEL
Cholesterol: 240 mg/dL — ABNORMAL HIGH (ref <200)
HDL: 44 mg/dL — ABNORMAL LOW (ref >=50)
LDL Cholesterol (Calc): 150 mg/dL (calc) — ABNORMAL HIGH
Non-HDL Cholesterol (Calc): 196 mg/dL (calc) — ABNORMAL HIGH (ref <130)
Total CHOL/HDL Ratio: 5.5 (calc) — ABNORMAL HIGH (ref <5.0)
Triglycerides: 299 mg/dL — ABNORMAL HIGH (ref <150)

## 2019-12-19 LAB — TSH: TSH: 0.06 mIU/L — ABNORMAL LOW

## 2019-12-19 MED ORDER — THYROID 120 MG PO TABS: 120.0000 mg | ORAL_TABLET | Freq: Every day | ORAL | 3 refills | Status: DC

## 2020-01-04 ENCOUNTER — Encounter: Payer: Self-pay | Admitting: Adult Health

## 2020-01-04 ENCOUNTER — Ambulatory Visit (INDEPENDENT_AMBULATORY_CARE_PROVIDER_SITE_OTHER): Payer: No Typology Code available for payment source | Admitting: Adult Health

## 2020-01-04 ENCOUNTER — Other Ambulatory Visit: Payer: Self-pay

## 2020-01-04 VITALS — BP 112/75 | HR 79 | Ht 62.0 in | Wt 189.2 lb

## 2020-01-04 DIAGNOSIS — N946 Dysmenorrhea, unspecified: Secondary | ICD-10-CM

## 2020-01-04 DIAGNOSIS — N921 Excessive and frequent menstruation with irregular cycle: Secondary | ICD-10-CM

## 2020-01-04 DIAGNOSIS — Z3202 Encounter for pregnancy test, result negative: Secondary | ICD-10-CM

## 2020-01-04 DIAGNOSIS — F32A Depression, unspecified: Secondary | ICD-10-CM

## 2020-01-04 DIAGNOSIS — F329 Major depressive disorder, single episode, unspecified: Secondary | ICD-10-CM

## 2020-01-04 LAB — POCT URINE PREGNANCY: Preg Test, Ur: NEGATIVE

## 2020-01-04 NOTE — Progress Notes (Signed)
  Subjective:     Patient ID: Katherine Villegas, female   DOB: 1991-05-05, 29 y.o.   MRN: [id number]  HPI Katherine Villegas is a 29 year old white female, married, [id number] in complaining of irregular painful periods. Dr Karilyn Cota put her on Prometrium and that has not helped, just helps sleep better. She has lost 36 lbs.  PCP is Dr Karilyn Cota  Review of Systems Periods are irregular, may bleed 3-11 days  Has pain 1-2 days before period and during period and then 1-2 days after period  May have 2 periods in 1 month or just 1 long period  Has had tubal ligation  Has increase in headaches too   Reviewed past medical,surgical, social and family history. Reviewed medications and allergies.     Objective:   Physical Exam BP 112/75 (BP Location: Left Arm, Patient Position: Sitting, Cuff Size: Normal)   Pulse 79   Ht 5\' 2"  (1.575 m)   Wt 189 lb 3.2 oz (85.8 kg)   LMP 12/31/2019 (Exact Date)   BMI 34.61 kg/m UPT is negative. Skin warm and dry.Pelvic: external genitalia is normal in appearance no lesions, vagina: white discharge with odor,urethra has no lesions or masses noted, cervix:smooth and bulbous, uterus: normal size, shape and contour, non tender, no masses felt, adnexa: no masses or tenderness noted. Bladder is non tender and no masses felt. Fall risk is low PHQ 9 score is 17, denies being suicidal, sees Dr 01/02/2020     Assessment:     1. Urine pregnancy test negative  2. Menorrhagia with irregular cycle Get GYN Karilyn Cota to assess uterus and ovaries   3. Dysmenorrhea Get GYN [place]  4. Depression, unspecified depression type Sees Dr [place]    Plan:     Return for GYN Karilyn Cota and will talk when results back,already discussed options like IUD, and ablation, she does not want an IUD

## 2020-01-05 ENCOUNTER — Other Ambulatory Visit (INDEPENDENT_AMBULATORY_CARE_PROVIDER_SITE_OTHER): Payer: Self-pay | Admitting: Internal Medicine

## 2020-01-16 ENCOUNTER — Other Ambulatory Visit: Payer: Self-pay

## 2020-01-16 ENCOUNTER — Ambulatory Visit (INDEPENDENT_AMBULATORY_CARE_PROVIDER_SITE_OTHER): Payer: PRIVATE HEALTH INSURANCE

## 2020-01-16 DIAGNOSIS — N921 Excessive and frequent menstruation with irregular cycle: Secondary | ICD-10-CM

## 2020-01-16 DIAGNOSIS — N83291 Other ovarian cyst, right side: Secondary | ICD-10-CM

## 2020-01-16 DIAGNOSIS — N946 Dysmenorrhea, unspecified: Secondary | ICD-10-CM

## 2020-01-16 NOTE — Progress Notes (Addendum)
PELVIC US TA/TV:homogeneous anteverted uterus,wnl,EEC 6.9 mm,normal left ovary,septated complex irregular right ovarian cyst with a ring of color flow (? functional cyst)  2.2 x 1.2 x 2 cm ,no free fluid,no pain during ultrasound

## 2020-03-19 ENCOUNTER — Ambulatory Visit (INDEPENDENT_AMBULATORY_CARE_PROVIDER_SITE_OTHER): Payer: PRIVATE HEALTH INSURANCE | Admitting: Internal Medicine

## 2020-03-19 ENCOUNTER — Other Ambulatory Visit: Payer: Self-pay

## 2020-03-19 ENCOUNTER — Encounter (INDEPENDENT_AMBULATORY_CARE_PROVIDER_SITE_OTHER): Payer: Self-pay | Admitting: Internal Medicine

## 2020-03-19 VITALS — BP 110/78 | HR 92 | Temp 96.6°F | Resp 18 | Ht 62.0 in | Wt 181.2 lb

## 2020-03-19 DIAGNOSIS — R5383 Other fatigue: Secondary | ICD-10-CM

## 2020-03-19 DIAGNOSIS — E282 Polycystic ovarian syndrome: Secondary | ICD-10-CM

## 2020-03-19 DIAGNOSIS — N943 Premenstrual tension syndrome: Secondary | ICD-10-CM

## 2020-03-19 DIAGNOSIS — R5381 Other malaise: Secondary | ICD-10-CM

## 2020-03-19 DIAGNOSIS — E782 Mixed hyperlipidemia: Secondary | ICD-10-CM

## 2020-03-19 NOTE — Progress Notes (Signed)
Metrics: Intervention Frequency ACO  Documented Smoking Status Yearly  Screened one or more times in 24 months  Cessation Counseling or  Active cessation medication Past 24 months  Past 24 months   Guideline developer: UpToDate (See UpToDate for funding source) Date Released: 2014       Wellness Office Visit  Subjective:  Patient ID: Katherine Villegas, female    DOB: [birth year]  Age: 29 y.o. MRN: [id number]  CC: This lady comes in for follow-up of hypothyroidism, PMS, vitamin D deficiency and migrainous headaches. HPI  She also gives a story of miscarriage, excessive hair on her legs, irregular cycles and ovarian cyst. I increased her desiccated NP thyroid on the last visit and she has tolerated this. She has managed to lose weight. She continues on progesterone for PMS symptoms as well as migrainous headaches and this seems to have helped her. She has hyperlipidemia which has not required statin therapy. Past Medical History:  Diagnosis Date  . Headache   . Hypothyroidism   . PMS (premenstrual syndrome) 07/31/2019  . Vitamin D deficiency disease 07/31/2019      Family History  Problem Relation Age of Onset  . Migraines Mother   . Hypertension Mother   . Hyperlipidemia Mother   . Alcohol abuse Mother   . Diabetes Maternal Grandfather   . Dementia Maternal Grandfather   . Alcohol abuse Maternal Grandfather   . Alcohol abuse Father   . Arthritis Maternal Grandmother   . COPD Paternal Grandmother   . Migraines Maternal Aunt   . Alcohol abuse Paternal Aunt   . Arthritis Paternal Aunt   . Arthritis Paternal Uncle     Social History   Social History Narrative   Married since 2015.Lives with husband and 3 kids.Homemaker.   Social History   Tobacco Use  . Smoking status: Current Some Day Smoker    Packs/day: 0.25    Years: 15.00    Pack years: 3.75    Types: Cigarettes  . Smokeless tobacco: Never Used  Substance Use Topics  . Alcohol use: Yes    Alcohol/week: 0.0  standard drinks    Comment: wine occassionally    Current Meds  Medication Sig  . Cholecalciferol (VITAMIN D3) 250 MCG (10000 UT) TABS Take 10,000 Units by mouth daily.  . progesterone (PROMETRIUM) 200 MG capsule TAKE ONE CAPSULE BY MOUTH EVERY NIGHT.  Marland Kitchen thyroid (NP THYROID) 120 MG tablet Take 1 tablet (120 mg total) by mouth daily before breakfast.  . topiramate (TOPAMAX) 200 MG tablet TAKE 1 TABLET BY MOUTH EVERY DAY     Objective:   Today's Vitals: BP 110/78 (BP Location: Right Arm, Patient Position: Sitting, Cuff Size: Normal)   Pulse 92   Temp (!) 96.6 F (35.9 C) (Temporal)   Resp 18   Ht 5\' 2"  (1.575 m)   Wt 181 lb 3.2 oz (82.2 kg)   SpO2 97%   BMI 33.14 kg/m  Vitals with BMI 03/19/2020 01/04/2020 12/18/2019  Height 5\' 2"  5\' 2"  5\' 2"   Weight 181 lbs 3 oz 189 lbs 3 oz 192 lbs  BMI 33.13 34.6 35.11  Systolic 110 112 02/15/2020  Diastolic 78 75 70  Pulse 92 79 70     Physical Exam  She looks systemically well.  She has lost 7 pounds since last time I saw her in February.  Blood pressure is excellent.     Assessment   1. Mixed hyperlipidemia   2. PMS (premenstrual syndrome)   3. PCOS (polycystic  ovarian syndrome)   4. Malaise and fatigue       Tests ordered Orders Placed This Encounter  Procedures  . Luteinizing hormone  . Follicle stimulating hormone  . Testos,Total,Free and SHBG (Female)  . T3, free  . Lipid panel     Plan: 1. I am concerned about the possible diagnosis of PCOS and we will do blood work to further evaluate for this. 2. Lipid panel will be checked today. 3. She will continue with the same dose of NP thyroid and we will see what her T3 levels are now. 4. We also discussed nutrition again and the idea of a plant-based diet.  Further recommendations will depend on blood results. 5. Follow-up in 1 month.  I spent 30 minutes with this patient today reviewing her previous results regarding blood work that might suggest the possibility of PCOS as  well as more recent pelvic/vagina ultrasounds.   No orders of the defined types were placed in this encounter.   Doree Albee, MD

## 2020-03-23 LAB — LIPID PANEL
Cholesterol: 223 mg/dL — ABNORMAL HIGH (ref <200)
HDL: 41 mg/dL — ABNORMAL LOW (ref >=50)
LDL Cholesterol (Calc): 142 mg/dL (calc) — ABNORMAL HIGH
Non-HDL Cholesterol (Calc): 182 mg/dL (calc) — ABNORMAL HIGH (ref <130)
Total CHOL/HDL Ratio: 5.4 (calc) — ABNORMAL HIGH (ref <5.0)
Triglycerides: 245 mg/dL — ABNORMAL HIGH (ref <150)

## 2020-03-23 LAB — TESTOS,TOTAL,FREE AND SHBG (FEMALE)
Free Testosterone: 1.7 pg/mL (ref 0.1–6.4)
Sex Hormone Binding: 78 nmol/L (ref 17–124)
Testosterone, Total, LC-MS-MS: 21 ng/dL (ref 2–45)

## 2020-03-23 LAB — FOLLICLE STIMULATING HORMONE: FSH: 11.6 m[IU]/mL

## 2020-03-23 LAB — T3, FREE: T3, Free: 5.2 pg/mL — ABNORMAL HIGH (ref 2.3–4.2)

## 2020-03-23 LAB — LUTEINIZING HORMONE: LH: 5.8 m[IU]/mL

## 2020-03-24 ENCOUNTER — Other Ambulatory Visit (INDEPENDENT_AMBULATORY_CARE_PROVIDER_SITE_OTHER): Payer: Self-pay | Admitting: Internal Medicine

## 2020-03-25 NOTE — Progress Notes (Signed)
Called several times to contact patient to leave a message. But phone would not allow to leave messages.

## 2020-04-09 ENCOUNTER — Other Ambulatory Visit (INDEPENDENT_AMBULATORY_CARE_PROVIDER_SITE_OTHER): Payer: Self-pay | Admitting: Internal Medicine

## 2020-05-21 ENCOUNTER — Encounter (INDEPENDENT_AMBULATORY_CARE_PROVIDER_SITE_OTHER): Payer: Self-pay | Admitting: Internal Medicine

## 2020-05-22 ENCOUNTER — Other Ambulatory Visit (INDEPENDENT_AMBULATORY_CARE_PROVIDER_SITE_OTHER): Payer: Self-pay | Admitting: Internal Medicine

## 2020-05-22 MED ORDER — THYROID 60 MG PO TABS: 120.0000 mg | ORAL_TABLET | Freq: Every day | ORAL | 3 refills | Status: DC

## 2020-07-05 ENCOUNTER — Other Ambulatory Visit (INDEPENDENT_AMBULATORY_CARE_PROVIDER_SITE_OTHER): Payer: Self-pay | Admitting: Internal Medicine

## 2020-09-30 ENCOUNTER — Encounter: Payer: Self-pay | Admitting: *Deleted

## 2020-09-30 ENCOUNTER — Ambulatory Visit: Payer: No Typology Code available for payment source | Admitting: Women's Health

## 2020-10-01 ENCOUNTER — Encounter: Payer: Self-pay | Admitting: Women's Health

## 2020-10-01 ENCOUNTER — Ambulatory Visit (INDEPENDENT_AMBULATORY_CARE_PROVIDER_SITE_OTHER): Payer: No Typology Code available for payment source | Admitting: Women's Health

## 2020-10-01 ENCOUNTER — Other Ambulatory Visit: Payer: Self-pay

## 2020-10-01 ENCOUNTER — Ambulatory Visit: Payer: No Typology Code available for payment source | Admitting: Women's Health

## 2020-10-01 ENCOUNTER — Other Ambulatory Visit (HOSPITAL_COMMUNITY)
Admission: RE | Admit: 2020-10-01 | Discharge: 2020-10-01 | Disposition: A | Discharge status: Home / Self Care | Payer: No Typology Code available for payment source | Source: Ambulatory Visit | Attending: Obstetrics & Gynecology | Admitting: Obstetrics & Gynecology

## 2020-10-01 VITALS — BP 100/70 | Ht 62.0 in | Wt 175.0 lb

## 2020-10-01 DIAGNOSIS — N923 Ovulation bleeding: Secondary | ICD-10-CM

## 2020-10-01 DIAGNOSIS — N920 Excessive and frequent menstruation with regular cycle: Secondary | ICD-10-CM | POA: Diagnosis not present

## 2020-10-01 DIAGNOSIS — Z3202 Encounter for pregnancy test, result negative: Secondary | ICD-10-CM

## 2020-10-01 LAB — POCT URINE PREGNANCY: Preg Test, Ur: NEGATIVE

## 2020-10-01 NOTE — Progress Notes (Signed)
GYN VISIT Patient name: Katherine Villegas MRN [id number]  Date of birth: 04/24/1991 Chief Complaint:   period 14 days late (period 11-16 thur 18 spotting)  History of Present Illness:   Katherine Villegas is a 29 y.o. [phone] Caucasian female being seen today for report of late period.  Usually has one 'hellacious' period that lasts 5 days, heavy entire time, changes saturated pad and tampon q 1-2hrs, bleeds onto clothes, bad cramps. Then usually has few days of spotting randomly during the month. LMP 10/10, and did not have regular period in Nov, just had spotting 11/16-11/18. Denies abnormal discharge, itching/odor/irritation.  Is on progesterone 200mg  po q night by PCP, helps her sleep, but doesn't really help w/ periods. Does not want any form of birth control, they have all caused issues in the past. Is interested in ablation. Had pelvic u/s in March, normal except for Rt functional cyst.  Has had BTL, still very afraid of getting pregnant, to the point it has effected sex life. Wants husband to get vasectomy. Is allergic to condoms/spermicides.     Depression screen [facility] 2/9 01/04/2020 01/20/2018 08/02/2017  Decreased Interest 2 2 0  Down, Depressed, Hopeless 1 0 0  PHQ - 2 Score 3 2 0  Altered sleeping 3 3 -  Tired, decreased energy 3 2 -  Change in appetite 3 0 -  Feeling bad or failure about yourself  0 0 -  Trouble concentrating 3 0 -  Moving slowly or fidgety/restless 2 0 -  Suicidal thoughts 0 0 -  PHQ-9 Score 17 7 -  Difficult doing work/chores Very difficult Very difficult -    Patient's last menstrual period was 08/17/2020. The current method of family planning is tubal ligation.  Last pap 2018. Results were:  normal Review of Systems:   Pertinent items are noted in HPI Denies fever/chills, dizziness, headaches, visual disturbances, fatigue, shortness of breath, chest pain, abdominal pain, vomiting, abnormal vaginal discharge/itching/odor/irritation, problems with periods, bowel  movements, urination, or intercourse unless otherwise stated above.  Pertinent History Reviewed:  Reviewed past medical,surgical, social, obstetrical and family history.  Reviewed problem list, medications and allergies. Physical Assessment:   Vitals:   10/01/20 1011  BP: 100/70  Weight: 175 lb (79.4 kg)  Height: 5\' 2"  (1.575 m)  Body mass index is 32.01 kg/m.       Physical Examination:   General appearance: alert, well appearing, and in no distress  Mental status: alert, oriented to person, place, and time  Skin: warm & dry   Cardiovascular: normal heart rate noted  Respiratory: normal respiratory effort, no distress  Abdomen: soft, non-tender   Pelvic: VULVA: normal appearing vulva with no masses, tenderness or lesions, VAGINA: normal appearing vagina with normal color and discharge, no lesions, CERVIX: normal appearing cervix without discharge or lesions  Extremities: no edema   Chaperone: 10/03/20    Results for orders placed or performed in visit on 10/01/20 (from the past 24 hour(s))  POCT urine pregnancy   Collection Time: 10/01/20 10:24 AM  Result Value Ref Range   Preg Test, Ur Negative Negative    Assessment & Plan:  1) Menorrhagia w/ normally regular period> wants ablation- will schedule w/ [facility] to discuss  2) Intermenstrual spotting> CV swab  3) Strong fear of getting pregnant> despite BTL, husband is planning vasectomy, gave sample of Phexxi to try for now  Meds: No orders of the defined types were placed in this encounter.   Orders Placed This Encounter  Procedures  . POCT urine pregnancy    Return for 1st available pap & physical w/ [facility] (to discuss ablation also).  Cheral Marker CNM, [facility] 10/01/2020 11:04 AM

## 2020-10-03 LAB — CERVICOVAGINAL ANCILLARY ONLY
Bacterial Vaginitis (gardnerella): NEGATIVE
Candida Glabrata: NEGATIVE
Candida Vaginitis: NEGATIVE
Chlamydia: NEGATIVE
Comment: NEGATIVE
Comment: NEGATIVE
Comment: NEGATIVE
Comment: NEGATIVE
Comment: NEGATIVE
Comment: NORMAL
Neisseria Gonorrhea: NEGATIVE
Trichomonas: NEGATIVE

## 2020-10-09 ENCOUNTER — Telehealth (INDEPENDENT_AMBULATORY_CARE_PROVIDER_SITE_OTHER): Payer: Self-pay | Admitting: Nurse Practitioner

## 2020-10-09 ENCOUNTER — Other Ambulatory Visit (INDEPENDENT_AMBULATORY_CARE_PROVIDER_SITE_OTHER): Payer: Self-pay | Admitting: Internal Medicine

## 2020-10-09 NOTE — Telephone Encounter (Signed)
Please call patient and get her scheduled for follow-up appointment within the next few weeks. I just refilled her medications, but will not be able to refill any additional meds unless she has a follow-up appointment.

## 2020-11-19 ENCOUNTER — Other Ambulatory Visit (INDEPENDENT_AMBULATORY_CARE_PROVIDER_SITE_OTHER): Payer: Self-pay | Admitting: Internal Medicine

## 2020-12-02 ENCOUNTER — Other Ambulatory Visit: Payer: No Typology Code available for payment source | Admitting: Obstetrics & Gynecology

## 2020-12-04 ENCOUNTER — Ambulatory Visit (INDEPENDENT_AMBULATORY_CARE_PROVIDER_SITE_OTHER): Payer: No Typology Code available for payment source | Admitting: Internal Medicine

## 2020-12-15 ENCOUNTER — Ambulatory Visit (INDEPENDENT_AMBULATORY_CARE_PROVIDER_SITE_OTHER): Payer: No Typology Code available for payment source | Admitting: Internal Medicine

## 2020-12-29 ENCOUNTER — Ambulatory Visit (INDEPENDENT_AMBULATORY_CARE_PROVIDER_SITE_OTHER): Payer: No Typology Code available for payment source | Admitting: Internal Medicine

## 2021-01-01 ENCOUNTER — Ambulatory Visit (INDEPENDENT_AMBULATORY_CARE_PROVIDER_SITE_OTHER): Payer: No Typology Code available for payment source | Admitting: Internal Medicine

## 2021-01-01 ENCOUNTER — Encounter (INDEPENDENT_AMBULATORY_CARE_PROVIDER_SITE_OTHER): Payer: Self-pay | Admitting: Internal Medicine

## 2021-01-01 ENCOUNTER — Other Ambulatory Visit: Payer: Self-pay

## 2021-01-01 VITALS — BP 108/70 | HR 98 | Temp 96.3°F | Resp 18 | Ht 62.0 in | Wt 177.0 lb

## 2021-01-01 DIAGNOSIS — N943 Premenstrual tension syndrome: Secondary | ICD-10-CM

## 2021-01-01 DIAGNOSIS — E782 Mixed hyperlipidemia: Secondary | ICD-10-CM

## 2021-01-01 NOTE — Progress Notes (Signed)
Metrics: Intervention Frequency ACO  Documented Smoking Status Yearly  Screened one or more times in 24 months  Cessation Counseling or  Active cessation medication Past 24 months  Past 24 months   Guideline developer: UpToDate (See UpToDate for funding source) Date Released: 2014       Wellness Office Visit  Subjective:  Patient ID: Katherine Villegas, female    DOB: [birth year]  Age: 30 y.o. MRN: [id number]  CC: This lady comes in for follow-up of dyslipidemia, PMS and thyroid hypofunction. HPI  She has been tolerating NP thyroid 120 mg daily.  She has been inconsistent with nutrition and has not lost any significant weight. She continues with progesterone at night which seems to help her significantly. Past Medical History:  Diagnosis Date  . Headache   . Hypothyroidism   . PMS (premenstrual syndrome) 07/31/2019  . Vitamin D deficiency disease 07/31/2019   Past Surgical History:  Procedure Laterality Date  . CESAREAN SECTION    . EXTERNAL EAR SURGERY    . INCISIONAL HERNIA REPAIR N/A 06/04/2019   Procedure: LAPAROSCOPIC INCISIONAL HERNIA WITH MESH;  Surgeon: Franky Macho, MD;  Location: [facility];  Service: General;  Laterality: N/A;  . TONSILLECTOMY    . TUBAL LIGATION Bilateral 08/06/2018   Procedure: POST PARTUM TUBAL LIGATION;  Surgeon: Lazaro Arms, MD;  Location: [facility]THING SUIT[facility];  Service: Gynecology;  Laterality: Bilateral;  . WISDOM TOOTH EXTRACTION       Family History  Problem Relation Age of Onset  . Migraines Mother   . Hypertension Mother   . Hyperlipidemia Mother   . Alcohol abuse Mother   . Diabetes Maternal Grandfather   . Dementia Maternal Grandfather   . Alcohol abuse Maternal Grandfather   . Alcohol abuse Father   . Arthritis Maternal Grandmother   . COPD Paternal Grandmother   . Migraines Maternal Aunt   . Alcohol abuse Paternal Aunt   . Arthritis Paternal Aunt   . Arthritis Paternal Uncle     Social History   Social History Narrative    Married since 2015.Lives with husband and 3 kids.Homemaker.   Social History   Tobacco Use  . Smoking status: Current Some Day Smoker    Packs/day: 0.25    Years: 15.00    Pack years: 3.75    Types: Cigarettes  . Smokeless tobacco: Never Used  Substance Use Topics  . Alcohol use: Yes    Alcohol/week: 0.0 standard drinks    Comment: wine occassionally    Current Meds  Medication Sig  . Cholecalciferol (VITAMIN D3) 250 MCG (10000 UT) TABS Take 10,000 Units by mouth daily.  . NP THYROID 60 MG tablet TAKE TWO (2) TABLETS BY MOUTH ONCE DAILY BEFORE BREAKFAST.  . progesterone (PROMETRIUM) 200 MG capsule TAKE ONE CAPSULE BY MOUTH EVERY NIGHT.  Marland Kitchen topiramate (TOPAMAX) 200 MG tablet TAKE 1 TABLET BY MOUTH EVERY DAY     Flowsheet Row Office Visit from 01/04/2020 in [facility]  PHQ-9 Total Score 17      Objective:   Today's Vitals: BP 108/70 (BP Location: Left Arm, Patient Position: Sitting, Cuff Size: Normal)   Pulse 98   Temp (!) 96.3 F (35.7 C) (Temporal)   Resp 18   Ht 5\' 2"  (1.575 m)   Wt 177 lb (80.3 kg)   SpO2 98%   BMI 32.37 kg/m  Vitals with BMI 01/01/2021 10/01/2020 03/19/2020  Height 5\' 2"  5\' 2"  5\' 2"   Weight 177 lbs 175 lbs 181  lbs 3 oz  BMI 32.37 32 33.13  Systolic 108 100 282  Diastolic 70 70 78  Pulse 98 - 92     Physical Exam  She looks systemically well.  She remains obese.  Blood pressure excellent.    Assessment   1. Mixed hyperlipidemia   2. PMS (premenstrual syndrome)       Tests ordered Orders Placed This Encounter  Procedures  . Lipid panel     Plan: 1. She will continue with NP thyroid. 2. We will check lipid panel. 3. She will continue progesterone for PMS symptoms. 4. Follow-up in 3 months.   No orders of the defined types were placed in this encounter.   Wilson Singer, MD

## 2021-01-02 LAB — LIPID PANEL
Cholesterol: 218 mg/dL — ABNORMAL HIGH (ref <200)
HDL: 49 mg/dL — ABNORMAL LOW (ref >=50)
LDL Cholesterol (Calc): 133 mg/dL (calc) — ABNORMAL HIGH
Non-HDL Cholesterol (Calc): 169 mg/dL (calc) — ABNORMAL HIGH (ref <130)
Total CHOL/HDL Ratio: 4.4 (calc) (ref <5.0)
Triglycerides: 218 mg/dL — ABNORMAL HIGH (ref <150)

## 2021-01-05 ENCOUNTER — Other Ambulatory Visit (INDEPENDENT_AMBULATORY_CARE_PROVIDER_SITE_OTHER): Payer: Self-pay | Admitting: Internal Medicine

## 2021-01-07 ENCOUNTER — Other Ambulatory Visit (INDEPENDENT_AMBULATORY_CARE_PROVIDER_SITE_OTHER): Payer: Self-pay | Admitting: Nurse Practitioner

## 2021-03-24 ENCOUNTER — Other Ambulatory Visit (INDEPENDENT_AMBULATORY_CARE_PROVIDER_SITE_OTHER): Payer: Self-pay | Admitting: Internal Medicine

## 2021-04-11 ENCOUNTER — Other Ambulatory Visit (INDEPENDENT_AMBULATORY_CARE_PROVIDER_SITE_OTHER): Payer: Self-pay | Admitting: Internal Medicine

## 2021-04-15 ENCOUNTER — Encounter (INDEPENDENT_AMBULATORY_CARE_PROVIDER_SITE_OTHER): Payer: Self-pay | Admitting: Internal Medicine

## 2021-04-15 ENCOUNTER — Other Ambulatory Visit: Payer: Self-pay

## 2021-04-15 ENCOUNTER — Ambulatory Visit (INDEPENDENT_AMBULATORY_CARE_PROVIDER_SITE_OTHER): Payer: Self-pay | Admitting: Internal Medicine

## 2021-04-15 VITALS — BP 114/74 | HR 82 | Temp 97.7°F | Ht 62.0 in | Wt 184.0 lb

## 2021-04-15 DIAGNOSIS — N943 Premenstrual tension syndrome: Secondary | ICD-10-CM

## 2021-04-15 DIAGNOSIS — E782 Mixed hyperlipidemia: Secondary | ICD-10-CM

## 2021-04-15 DIAGNOSIS — E282 Polycystic ovarian syndrome: Secondary | ICD-10-CM

## 2021-04-15 NOTE — Progress Notes (Signed)
Metrics: Intervention Frequency ACO  Documented Smoking Status Yearly  Screened one or more times in 24 months  Cessation Counseling or  Active cessation medication Past 24 months  Past 24 months   Guideline developer: UpToDate (See UpToDate for funding source) Date Released: 2014       Wellness Office Visit  Subjective:  Patient ID: Katherine Villegas, female    DOB: 1990/11/23  Age: 30 y.o. MRN: [id number]  CC: This lady comes in for follow-up of PCOS, hyperlipidemia, PMS. HPI  She has gained weight since last visit.  She does not know why.  She has been eating in the same fashion.  She takes NP thyroid 120 mg in the morning.  Need She continues to take progesterone for her PMS. Past Medical History:  Diagnosis Date  . Headache   . Hypothyroidism   . PMS (premenstrual syndrome) 07/31/2019  . Vitamin D deficiency disease 07/31/2019   Past Surgical History:  Procedure Laterality Date  . CESAREAN SECTION    . EXTERNAL EAR SURGERY    . INCISIONAL HERNIA REPAIR N/A 06/04/2019   Procedure: LAPAROSCOPIC INCISIONAL HERNIA WITH MESH;  Surgeon: Franky Macho, MD;  Location: [facility];  Service: General;  Laterality: N/A;  . TONSILLECTOMY    . TUBAL LIGATION Bilateral 08/06/2018   Procedure: POST PARTUM TUBAL LIGATION;  Surgeon: Lazaro Arms, MD;  Location: [facility] BIRTHING SUIT[facility];  Service: Gynecology;  Laterality: Bilateral;  . WISDOM TOOTH EXTRACTION       Family History  Problem Relation Age of Onset  . Migraines Mother   . Hypertension Mother   . Hyperlipidemia Mother   . Alcohol abuse Mother   . Diabetes Maternal Grandfather   . Dementia Maternal Grandfather   . Alcohol abuse Maternal Grandfather   . Alcohol abuse Father   . Arthritis Maternal Grandmother   . COPD Paternal Grandmother   . Migraines Maternal Aunt   . Alcohol abuse Paternal Aunt   . Arthritis Paternal Aunt   . Arthritis Paternal Uncle     Social History   Social History Narrative   Married since 2015.Lives  with husband and 3 kids.Homemaker.   Social History   Tobacco Use  . Smoking status: Former Smoker    Packs/day: 0.25    Years: 15.00    Pack years: 3.75    Types: Cigarettes  . Smokeless tobacco: Never Used  Substance Use Topics  . Alcohol use: Yes    Alcohol/week: 0.0 standard drinks    Comment: wine occassionally    Current Meds  Medication Sig  . Cholecalciferol (VITAMIN D3) 250 MCG (10000 UT) TABS Take 10,000 Units by mouth daily.  . NP THYROID 60 MG tablet TAKE TWO (2) TABLETS BY MOUTH ONCE DAILY BEFORE BREAKFAST  . progesterone (PROMETRIUM) 200 MG capsule TAKE ONE CAPSULE BY MOUTH EVERY NIGHT.  Marland Kitchen topiramate (TOPAMAX) 200 MG tablet TAKE 1 TABLET BY MOUTH EVERY DAY     Flowsheet Row Office Visit from 04/15/2021 in [facility]  PHQ-9 Total Score 0      Objective:   Today's Vitals: BP 114/74   Pulse 82   Temp 97.7 F (36.5 C) (Temporal)   Ht 5\' 2"  (1.575 m)   Wt 184 lb (83.5 kg)   SpO2 98%   BMI 33.65 kg/m  Vitals with BMI 04/15/2021 01/01/2021 10/01/2020  Height 5\' 2"  5\' 2"  5\' 2"   Weight 184 lbs 177 lbs 175 lbs  BMI 33.65 32.37 32  Systolic 114 108 10/03/2020  Diastolic  74 70 70  Pulse 82 98 -     Physical Exam  She looks systemically well.  She is obese.  She has gained 7 pounds in weight since last visit.     Assessment   1. PCOS (polycystic ovarian syndrome)   2. Mixed hyperlipidemia   3. PMS (premenstrual syndrome)       Tests ordered Orders Placed This Encounter  Procedures  . T3, free  . TSH     Plan: 1. I told her that we probably need to further optimize thyroid so we will check levels today and see how we can do this to improve her insulin resistance. 2. I will see her in September for an annual physical exam.   No orders of the defined types were placed in this encounter.   Wilson Singer, MD

## 2021-04-16 ENCOUNTER — Other Ambulatory Visit (INDEPENDENT_AMBULATORY_CARE_PROVIDER_SITE_OTHER): Payer: Self-pay | Admitting: Internal Medicine

## 2021-04-16 LAB — T3, FREE: T3, Free: 6.4 pg/mL — ABNORMAL HIGH (ref 2.3–4.2)

## 2021-04-16 LAB — TSH: TSH: <0.01 mIU/L — ABNORMAL LOW

## 2021-04-16 MED ORDER — NP THYROID 90 MG PO TABS: 90.0000 mg | ORAL_TABLET | Freq: Two times a day (BID) | ORAL | 3 refills | Status: DC

## 2021-07-29 ENCOUNTER — Encounter (INDEPENDENT_AMBULATORY_CARE_PROVIDER_SITE_OTHER): Payer: No Typology Code available for payment source | Admitting: Internal Medicine

## 2021-08-13 ENCOUNTER — Other Ambulatory Visit (HOSPITAL_COMMUNITY)
Admission: RE | Admit: 2021-08-13 | Discharge: 2021-08-13 | Disposition: A | Discharge status: Home / Self Care | Payer: No Typology Code available for payment source | Source: Ambulatory Visit | Attending: Adult Health | Admitting: Adult Health

## 2021-08-13 ENCOUNTER — Ambulatory Visit (INDEPENDENT_AMBULATORY_CARE_PROVIDER_SITE_OTHER): Payer: No Typology Code available for payment source | Admitting: Adult Health

## 2021-08-13 ENCOUNTER — Encounter: Payer: Self-pay | Admitting: Adult Health

## 2021-08-13 ENCOUNTER — Other Ambulatory Visit: Payer: Self-pay

## 2021-08-13 VITALS — BP 94/64 | HR 71 | Ht 62.0 in | Wt 193.0 lb

## 2021-08-13 DIAGNOSIS — Z01419 Encounter for gynecological examination (general) (routine) without abnormal findings: Secondary | ICD-10-CM

## 2021-08-13 DIAGNOSIS — Z113 Encounter for screening for infections with a predominantly sexual mode of transmission: Secondary | ICD-10-CM | POA: Diagnosis not present

## 2021-08-13 DIAGNOSIS — G43119 Migraine with aura, intractable, without status migrainosus: Secondary | ICD-10-CM

## 2021-08-13 DIAGNOSIS — E039 Hypothyroidism, unspecified: Secondary | ICD-10-CM | POA: Diagnosis not present

## 2021-08-13 MED ORDER — TOPIRAMATE 200 MG PO TABS: 200.0000 mg | ORAL_TABLET | Freq: Every day | ORAL | 1 refills | Status: AC

## 2021-08-13 MED ORDER — PROGESTERONE 200 MG PO CAPS: ORAL_CAPSULE | ORAL | 6 refills | Status: DC

## 2021-08-13 NOTE — Progress Notes (Signed)
Patient ID: Katherine Villegas, female   DOB: 04/19/1991, 30 y.o.   MRN: [id number] History of Present Illness: Katherine Villegas is a 30 year old white female,married, [id number] in for a well woman gyn exam and pap. She is having a week lon migraine, not sleeping and has gained weight, she has been off Prometrium, NP thyroid and Topamax since mid 04-Jul-2023 when Dr Karilyn Cota died. She is going to see Dr Olena Leatherwood in mid November.    Current Medications, Allergies, Past Medical History, Past Surgical History, Family History and Social History were reviewed in [employer] record.     Review of Systems: Patient denies any hearing loss, fatigue, blurred vision, shortness of breath, chest pain, abdominal pain, problems with bowel movements, urination, or intercourse. No joint pain or mood swings.  See HPI for positives.    Physical Exam:BP 94/64 (BP Location: Right Arm, Patient Position: Sitting, Cuff Size: Normal)   Pulse 71   Ht 5\' 2"  (1.575 m)   Wt 193 lb (87.5 kg)   LMP 07/28/2021 (Exact Date)   BMI 35.30 kg/m   General:  Well developed, well nourished, no acute distress Skin:  Warm and dry Neck:  Midline trachea, normal thyroid, good ROM, no lymphadenopathy Lungs; Clear to auscultation bilaterally Breast:  No dominant palpable mass, retraction, or nipple discharge Cardiovascular: Regular rate and rhythm Abdomen:  Soft, non tender, no hepatosplenomegaly Pelvic:  External genitalia is normal in appearance, no lesions.  The vagina is normal in appearance. Urethra has no lesions or masses. The cervix is bulbous.Pap with GC/CHL and HR HPV genotyping performed.  Uterus is felt to be normal size, shape, and contour.  No adnexal masses or tenderness noted.Bladder is non tender, no masses felt. Extremities/musculoskeletal:  No swelling or varicosities noted, no clubbing or cyanosis Psych:  No mood changes, alert and cooperative,seems happy AA is 0 Fall risk is low Depression screen [facility] 2/9  08/13/2021 04/15/2021 01/04/2020  Decreased Interest 1 0 2  Down, Depressed, Hopeless 0 0 1  PHQ - 2 Score 1 0 3  Altered sleeping 3 0 3  Tired, decreased energy 2 0 3  Change in appetite 2 0 3  Feeling bad or failure about yourself  0 0 0  Trouble concentrating 1 0 3  Moving slowly or fidgety/restless 1 0 2  Suicidal thoughts 0 0 0  PHQ-9 Score 10 0 17  Difficult doing work/chores - Not difficult at all Very difficult    GAD 7 : Generalized Anxiety Score 08/13/2021  Nervous, Anxious, on Edge 2  Control/stop worrying 2  Worry too much - different things 2  Trouble relaxing 2  Restless 2  Easily annoyed or irritable 2  Afraid - awful might happen 0  Total GAD 7 Score 12      Upstream - 08/13/21 10/13/21       Pregnancy Intention Screening   Does the patient want to become pregnant in the next year? No    Does the patient's partner want to become pregnant in the next year? No    Would the patient like to discuss contraceptive options today? No      Contraception Wrap Up   Current Method Female Sterilization    End Method Female Sterilization    Contraception Counseling Provided No            Examination chaperoned by [id number] LPN  Impression and Plan: 1. Encounter for gynecological examination with Papanicolaou smear of cervix Pap sent Pap in 3  years if normal Physical in 1 year - Cytology - PAP( [place])  2. Screen for STD (sexually transmitted disease) Check GC/CHL on pap - Cytology - PAP( [place])  3. Hypothyroidism, unspecified type Will check labs, and then Dr Olena Leatherwood to manage NP thyroid  - TSH - T4, free - T3, free  4. Intractable migraine with aura without status migrainosus Will refill Prometrium and topamax for her till sees Dr Denton [facility] ordered this encounter  Medications   progesterone (PROMETRIUM) 200 MG capsule    Sig: TAKE ONE CAPSULE BY MOUTH EVERY NIGHT.    Dispense:  30 capsule    Refill:  6    This prescription was filled  on 03/24/2021. Any refills authorized will be placed on file.    Order Specific Question:   Supervising Provider    Answer:   Lazaro Arms [[id number]]   topiramate (TOPAMAX) 200 MG tablet    Sig: Take 1 tablet (200 mg total) by mouth daily.    Dispense:  90 tablet    Refill:  1    Order Specific Question:   Supervising Provider    Answer:   Duane Lope H [[id number]]

## 2021-08-17 LAB — CYTOLOGY - PAP
Chlamydia: NEGATIVE
Comment: NEGATIVE
Comment: NEGATIVE
Comment: NORMAL
Diagnosis: NEGATIVE
High risk HPV: NEGATIVE
Neisseria Gonorrhea: NEGATIVE

## 2022-09-01 ENCOUNTER — Other Ambulatory Visit: Payer: Self-pay | Admitting: Adult Health

## 2023-05-26 ENCOUNTER — Other Ambulatory Visit: Payer: Self-pay | Admitting: Adult Health

## 2024-08-23 ENCOUNTER — Other Ambulatory Visit (HOSPITAL_COMMUNITY)
Admission: RE | Admit: 2024-08-23 | Discharge: 2024-08-23 | Disposition: A | Source: Ambulatory Visit | Attending: Adult Health | Admitting: Adult Health

## 2024-08-23 ENCOUNTER — Other Ambulatory Visit (HOSPITAL_COMMUNITY)
Admission: RE | Admit: 2024-08-23 | Discharge: 2024-08-23 | Disposition: A | Discharge status: Home / Self Care | Source: Ambulatory Visit | Attending: Adult Health | Admitting: Adult Health

## 2024-08-23 ENCOUNTER — Ambulatory Visit (INDEPENDENT_AMBULATORY_CARE_PROVIDER_SITE_OTHER): Admitting: Adult Health

## 2024-08-23 ENCOUNTER — Encounter: Payer: Self-pay | Admitting: Adult Health

## 2024-08-23 VITALS — BP 102/72 | HR 81 | Temp 103.3°F | Ht 62.25 in | Wt 223.5 lb

## 2024-08-23 DIAGNOSIS — Z1331 Encounter for screening for depression: Secondary | ICD-10-CM | POA: Diagnosis not present

## 2024-08-23 DIAGNOSIS — N926 Irregular menstruation, unspecified: Secondary | ICD-10-CM | POA: Diagnosis not present

## 2024-08-23 DIAGNOSIS — R61 Generalized hyperhidrosis: Secondary | ICD-10-CM

## 2024-08-23 DIAGNOSIS — Z01419 Encounter for gynecological examination (general) (routine) without abnormal findings: Secondary | ICD-10-CM | POA: Diagnosis present

## 2024-08-23 DIAGNOSIS — M25561 Pain in right knee: Secondary | ICD-10-CM

## 2024-08-23 DIAGNOSIS — N943 Premenstrual tension syndrome: Secondary | ICD-10-CM | POA: Diagnosis not present

## 2024-08-23 DIAGNOSIS — F32A Depression, unspecified: Secondary | ICD-10-CM | POA: Diagnosis not present

## 2024-08-23 DIAGNOSIS — R232 Flushing: Secondary | ICD-10-CM | POA: Insufficient documentation

## 2024-08-23 DIAGNOSIS — M25562 Pain in left knee: Secondary | ICD-10-CM

## 2024-08-23 DIAGNOSIS — Z1151 Encounter for screening for human papillomavirus (HPV): Secondary | ICD-10-CM

## 2024-08-23 DIAGNOSIS — F419 Anxiety disorder, unspecified: Secondary | ICD-10-CM | POA: Diagnosis not present

## 2024-08-23 DIAGNOSIS — M25571 Pain in right ankle and joints of right foot: Secondary | ICD-10-CM

## 2024-08-23 DIAGNOSIS — M25572 Pain in left ankle and joints of left foot: Secondary | ICD-10-CM

## 2024-08-23 MED ORDER — NORETHINDRONE 0.35 MG PO TABS: 1.0000 | ORAL_TABLET | Freq: Every day | ORAL | 3 refills | Status: AC

## 2024-08-23 NOTE — Progress Notes (Signed)
 Patient ID: Katherine Villegas, female   DOB: 1991-07-31, 33 y.o.   MRN: [id number] History of Present Illness: Katherine Villegas is a 33 year old white female, married, [id number] in for a well woman gyn exam and pap. She is complaining of irregular periods, now every 45-50 days and last 4-7 days, having hot flashes and night sweats and knees and ankle joints and muscles hurt and she is moody before periods, and has cramps. She has lost 20 lbs since April she says.  PCP is Dr Orpha    Current Medications, Allergies, Past Medical History, Past Surgical History, Family History and Social History were reviewed in [employer] electronic medical record.     Review of Systems: Patient denies any hearing loss, fatigue, blurred vision, shortness of breath, chest pain, abdominal pain, problems with bowel movements, urination, or intercourse. Has migraines with aura. Legs swell sometimes too.  See HPI for positives    Physical Exam:BP 102/72 (BP Location: Left Arm, Patient Position: Sitting, Cuff Size: Large)   Pulse 81   Ht 5' 2.25 (1.581 m)   Wt 223 lb 8 oz (101.4 kg)   LMP 08/22/2024 (Exact Date)   BMI 40.55 kg/m   General:  Well developed, well nourished, no acute distress Skin:  Warm and dry Neck:  Midline trachea, normal thyroid , good ROM, no lymphadenopathy Lungs; Clear to auscultation bilaterally Breast:  No dominant palpable mass, retraction, or nipple discharge Cardiovascular: Regular rate and rhythm Abdomen:  Soft, non tender, no hepatosplenomegaly Pelvic:  External genitalia is normal in appearance, no lesions.  The vagina is normal in appearance. Urethra has no lesions or masses. The cervix is bulbous.Pap with HR HPV genotyping performed.  Uterus is felt to be normal size, shape, and contour.  No adnexal masses or tenderness noted.Bladder is non tender, no masses felt. Extremities/musculoskeletal:  No swelling or varicosities noted, no clubbing or cyanosis Psych:  No mood changes, alert and  cooperative,seems happy AA is 0 Fall risk is low    08/23/2024    9:16 AM 08/13/2021    9:37 AM 04/15/2021    1:09 PM  Depression screen PHQ 2/9  Decreased Interest 2 1 0  Down, Depressed, Hopeless 2 0 0  PHQ - 2 Score 4 1 0  Altered sleeping 3 3 0  Tired, decreased energy 3 2 0  Change in appetite 3 2 0  Feeling bad or failure about yourself  0 0 0  Trouble concentrating 2 1 0  Moving slowly or fidgety/restless 0 1 0  Suicidal thoughts 0 0 0  PHQ-9 Score 15 10 0  Difficult doing work/chores   Not difficult at all       08/23/2024    9:17 AM 08/13/2021    9:40 AM  GAD 7 : Generalized Anxiety Score  Nervous, Anxious, on Edge 3 2  Control/stop worrying 1 2  Worry too much - different things 1 2  Trouble relaxing 3 2  Restless 2 2  Easily annoyed or irritable 3 2  Afraid - awful might happen 0 0  Total GAD 7 Score 13 12      Upstream - 08/23/24 0924       Pregnancy Intention Screening   Does the patient want to become pregnant in the next year? No    Does the patient's partner want to become pregnant in the next year? No    Would the patient like to discuss contraceptive options today? No      Contraception Wrap Up  Current Method Female Sterilization    End Method Female Sterilization    Contraception Counseling Provided No          Examination chaperoned by Clarita Salt LPN  Impression and plan: 1. Encounter for gynecological examination with Papanicolaou smear of cervix (Primary) Pap in 3 years if normal Physical with PCP Has labs with PCP, has had recently thyroid  was normal  - Cytology - PAP( [place])  2. Hot flashes Will try Micronor to see if helps with hot flashes and night sweats   3. Night sweats   4. Irregular periods Cycle varies 45-50 days now Will try Micronor to see if helps  Meds ordered this encounter  Medications   norethindrone (MICRONOR) 0.35 MG tablet    Sig: Take 1 tablet (0.35 mg total) by mouth daily.    Dispense:  84  tablet    Refill:  3    Supervising Provider:   JAYNE MINDER H [[id number]]   Follow up in 3 months for ROS   5. PMS (premenstrual syndrome) +moody before periods, she says can be mean   6. Arthralgia of both knees Will check labs to see if any arthritis or autoimmune process  - Antinuclear Antib (ANA) - Rheumatoid Factor - C-reactive protein  7. Arthralgia of both ankles Will check labs  - Antinuclear Antib (ANA) - Rheumatoid Factor - C-reactive protein  8. Anxiety and depression She is on Wellbutrin with PCP

## 2024-08-24 ENCOUNTER — Ambulatory Visit: Payer: Self-pay | Admitting: Adult Health

## 2024-08-24 LAB — RHEUMATOID FACTOR: Rheumatoid fact SerPl-aCnc: <10 [IU]/mL (ref <14.0)

## 2024-08-24 LAB — C-REACTIVE PROTEIN: CRP: 7 mg/L (ref 0–10)

## 2024-08-24 LAB — ANA: Anti Nuclear Antibody (ANA): NEGATIVE

## 2024-08-30 LAB — CYTOLOGY - PAP
Comment: NEGATIVE
Diagnosis: NEGATIVE
Diagnosis: REACTIVE
High risk HPV: NEGATIVE

## 2024-10-16 ENCOUNTER — Ambulatory Visit: Admitting: Adult Health

## 2024-10-23 ENCOUNTER — Ambulatory Visit: Admitting: Adult Health

## 2024-10-23 ENCOUNTER — Encounter: Payer: Self-pay | Admitting: Adult Health

## 2024-10-23 VITALS — BP 96/71 | HR 92 | Ht 62.0 in | Wt 223.5 lb

## 2024-10-23 DIAGNOSIS — R232 Flushing: Secondary | ICD-10-CM

## 2024-10-23 DIAGNOSIS — R61 Generalized hyperhidrosis: Secondary | ICD-10-CM

## 2024-10-23 DIAGNOSIS — N926 Irregular menstruation, unspecified: Secondary | ICD-10-CM

## 2024-10-23 NOTE — Progress Notes (Signed)
°  Subjective:     Patient ID: Katherine Villegas, female   DOB: June 01, 1991, 33 y.o.   MRN: [id number]  HPI [name] is a 33 year old white female, married, [id number] back in follow up on starting Micronor , for hot flashes and heavy irregular periods, and hot flashes are better, and periods are lighter. Has sinus issues, has taken Augmentin  once lately, to see PCP Friday.     Component Value Date/Time   DIAGPAP  08/23/2024 0925    - Negative for Intraepithelial Lesions or Malignancy (NILM)   DIAGPAP - Benign reactive/reparative changes 08/23/2024 0925   DIAGPAP  08/13/2021 0941    - Negative for intraepithelial lesion or malignancy (NILM)   HPVHIGH Negative 08/23/2024 0925   HPVHIGH Negative 08/13/2021 0941   ADEQPAP  08/23/2024 0925    Satisfactory but limited for evaluation with partially obscuring blood;   ADEQPAP transformation zone component present. 08/23/2024 0925   ADEQPAP  08/13/2021 0941    Satisfactory for evaluation; transformation zone component PRESENT.    PCP is Dr Orpha  Review of Systems Hot flashes are better and periods are lighter Has sinus issues Reviewed past medical,surgical, social and family history. Reviewed medications and allergies.     Objective:   Physical Exam BP 96/71 (BP Location: Left Arm, Patient Position: Sitting, Cuff Size: Large)   Pulse 92   Ht 5' 2 (1.575 m)   Wt 223 lb 8 oz (101.4 kg)   LMP 10/09/2024 (Exact Date)   BMI 40.88 kg/m     Skin warm and dry.  Lungs: clear to ausculation bilaterally. Cardiovascular: regular rate and rhythm.  Upstream - 10/23/24 0855       Pregnancy Intention Screening   Does the patient want to become pregnant in the next year? No    Does the patient's partner want to become pregnant in the next year? No    Would the patient like to discuss contraceptive options today? No      Contraception Wrap Up   Current Method Female Sterilization    End Method Female Sterilization    Contraception Counseling Provided  No           Assessment:     1. Hot flashes (Primary) Better on micronor , has refills   2. Night sweats Better   3. Irregular periods Periods lighter on micronor , still not regular     Plan:     Follow up prn
